# Patient Record
Sex: Male | Born: 1955 | Race: Black or African American | Hispanic: No | Marital: Married | Smoking: Never smoker
Health system: Southern US, Community
[De-identification: ages and names within clinical notes are randomized; demographics above are authoritative.]

## PROBLEM LIST (undated history)

## (undated) DIAGNOSIS — B019 Varicella without complication: Secondary | ICD-10-CM

## (undated) DIAGNOSIS — I10 Essential (primary) hypertension: Secondary | ICD-10-CM

## (undated) DIAGNOSIS — IMO0001 Reserved for inherently not codable concepts without codable children: Secondary | ICD-10-CM

## (undated) DIAGNOSIS — I4729 Other ventricular tachycardia: Secondary | ICD-10-CM

## (undated) DIAGNOSIS — I5020 Unspecified systolic (congestive) heart failure: Secondary | ICD-10-CM

## (undated) DIAGNOSIS — K635 Polyp of colon: Secondary | ICD-10-CM

## (undated) DIAGNOSIS — K219 Gastro-esophageal reflux disease without esophagitis: Secondary | ICD-10-CM

## (undated) DIAGNOSIS — R700 Elevated erythrocyte sedimentation rate: Secondary | ICD-10-CM

## (undated) DIAGNOSIS — I340 Nonrheumatic mitral (valve) insufficiency: Secondary | ICD-10-CM

## (undated) DIAGNOSIS — Z0000 Encounter for general adult medical examination without abnormal findings: Secondary | ICD-10-CM

## (undated) DIAGNOSIS — I4819 Other persistent atrial fibrillation: Secondary | ICD-10-CM

## (undated) DIAGNOSIS — R001 Bradycardia, unspecified: Secondary | ICD-10-CM

## (undated) DIAGNOSIS — M545 Low back pain: Secondary | ICD-10-CM

## (undated) DIAGNOSIS — R3911 Hesitancy of micturition: Secondary | ICD-10-CM

## (undated) DIAGNOSIS — T7840XA Allergy, unspecified, initial encounter: Secondary | ICD-10-CM

## (undated) DIAGNOSIS — I472 Ventricular tachycardia: Secondary | ICD-10-CM

## (undated) DIAGNOSIS — G629 Polyneuropathy, unspecified: Secondary | ICD-10-CM

## (undated) DIAGNOSIS — R748 Abnormal levels of other serum enzymes: Secondary | ICD-10-CM

## (undated) DIAGNOSIS — I428 Other cardiomyopathies: Secondary | ICD-10-CM

## (undated) DIAGNOSIS — I639 Cerebral infarction, unspecified: Secondary | ICD-10-CM

## (undated) HISTORY — DX: Polyneuropathy, unspecified: G62.9

## (undated) HISTORY — PX: CARDIAC CATHETERIZATION: SHX172

## (undated) HISTORY — DX: Gastro-esophageal reflux disease without esophagitis: K21.9

## (undated) HISTORY — DX: Encounter for general adult medical examination without abnormal findings: Z00.00

## (undated) HISTORY — DX: Cerebral infarction, unspecified: I63.9

## (undated) HISTORY — DX: Varicella without complication: B01.9

## (undated) HISTORY — DX: Ventricular tachycardia: I47.2

## (undated) HISTORY — DX: Hesitancy of micturition: R39.11

## (undated) HISTORY — DX: Abnormal levels of other serum enzymes: R74.8

## (undated) HISTORY — DX: Other persistent atrial fibrillation: I48.19

## (undated) HISTORY — DX: Other cardiomyopathies: I42.8

## (undated) HISTORY — DX: Reserved for inherently not codable concepts without codable children: IMO0001

## (undated) HISTORY — DX: Other ventricular tachycardia: I47.29

## (undated) HISTORY — DX: Unspecified systolic (congestive) heart failure: I50.20

## (undated) HISTORY — DX: Bradycardia, unspecified: R00.1

## (undated) HISTORY — DX: Elevated erythrocyte sedimentation rate: R70.0

## (undated) HISTORY — DX: Polyp of colon: K63.5

## (undated) HISTORY — DX: Allergy, unspecified, initial encounter: T78.40XA

## (undated) HISTORY — DX: Low back pain: M54.5

## (undated) HISTORY — DX: Nonrheumatic mitral (valve) insufficiency: I34.0

## (undated) HISTORY — DX: Essential (primary) hypertension: I10

---

## 2010-08-24 ENCOUNTER — Encounter (INDEPENDENT_AMBULATORY_CARE_PROVIDER_SITE_OTHER): Payer: Self-pay | Admitting: Pediatrics

## 2010-08-24 ENCOUNTER — Inpatient Hospital Stay (HOSPITAL_COMMUNITY)
Admission: EM | Admit: 2010-08-24 | Discharge: 2010-08-31 | Payer: Self-pay | Source: Home / Self Care | Attending: Neurology | Admitting: Neurology

## 2010-08-24 DIAGNOSIS — I4819 Other persistent atrial fibrillation: Secondary | ICD-10-CM

## 2010-08-24 DIAGNOSIS — I639 Cerebral infarction, unspecified: Secondary | ICD-10-CM

## 2010-08-24 HISTORY — DX: Other persistent atrial fibrillation: I48.19

## 2010-08-24 HISTORY — DX: Cerebral infarction, unspecified: I63.9

## 2010-08-25 ENCOUNTER — Encounter: Payer: Self-pay | Admitting: Pulmonary Disease

## 2010-08-26 ENCOUNTER — Encounter (INDEPENDENT_AMBULATORY_CARE_PROVIDER_SITE_OTHER): Payer: Self-pay | Admitting: Pediatrics

## 2010-08-30 ENCOUNTER — Encounter: Payer: Self-pay | Admitting: Internal Medicine

## 2010-09-01 ENCOUNTER — Telehealth (INDEPENDENT_AMBULATORY_CARE_PROVIDER_SITE_OTHER): Payer: Self-pay | Admitting: *Deleted

## 2010-09-02 ENCOUNTER — Encounter: Payer: Self-pay | Admitting: Physician Assistant

## 2010-09-02 ENCOUNTER — Ambulatory Visit: Payer: Self-pay | Admitting: Internal Medicine

## 2010-09-02 ENCOUNTER — Telehealth: Payer: Self-pay | Admitting: Internal Medicine

## 2010-09-02 DIAGNOSIS — I472 Ventricular tachycardia: Secondary | ICD-10-CM | POA: Insufficient documentation

## 2010-09-02 DIAGNOSIS — I4891 Unspecified atrial fibrillation: Secondary | ICD-10-CM | POA: Insufficient documentation

## 2010-09-02 DIAGNOSIS — R740 Nonspecific elevation of levels of transaminase and lactic acid dehydrogenase [LDH]: Secondary | ICD-10-CM

## 2010-09-02 DIAGNOSIS — L259 Unspecified contact dermatitis, unspecified cause: Secondary | ICD-10-CM | POA: Insufficient documentation

## 2010-09-02 DIAGNOSIS — I429 Cardiomyopathy, unspecified: Secondary | ICD-10-CM | POA: Insufficient documentation

## 2010-09-02 LAB — CONVERTED CEMR LAB: POC INR: 2.9

## 2010-09-03 ENCOUNTER — Ambulatory Visit: Payer: Self-pay | Admitting: Internal Medicine

## 2010-09-03 LAB — CONVERTED CEMR LAB
ALT: 39 units/L (ref 0–53)
AST: 37 units/L (ref 0–37)
Albumin: 3.6 g/dL (ref 3.5–5.2)
Alkaline Phosphatase: 68 units/L (ref 39–117)
BUN: 14 mg/dL (ref 6–23)
Basophils Absolute: 0 10*3/uL (ref 0.0–0.1)
Basophils Relative: 0.6 % (ref 0.0–3.0)
Bilirubin, Direct: 0.3 mg/dL (ref 0.0–0.3)
CO2: 26 meq/L (ref 19–32)
Calcium: 9.8 mg/dL (ref 8.4–10.5)
Chloride: 107 meq/L (ref 96–112)
Creatinine, Ser: 1 mg/dL (ref 0.4–1.5)
Eosinophils Absolute: 0.1 10*3/uL (ref 0.0–0.7)
Eosinophils Relative: 2.1 % (ref 0.0–5.0)
GFR calc non Af Amer: 97.13 mL/min (ref >60.00)
Glucose, Bld: 90 mg/dL (ref 70–99)
HCT: 43.3 % (ref 39.0–52.0)
Hemoglobin: 15.2 g/dL (ref 13.0–17.0)
Lymphocytes Relative: 15.5 % (ref 12.0–46.0)
Lymphs Abs: 0.8 10*3/uL (ref 0.7–4.0)
MCHC: 35 g/dL (ref 30.0–36.0)
MCV: 96.2 fL (ref 78.0–100.0)
Monocytes Absolute: 0.6 10*3/uL (ref 0.1–1.0)
Monocytes Relative: 12 % (ref 3.0–12.0)
Neutro Abs: 3.7 10*3/uL (ref 1.4–7.7)
Neutrophils Relative %: 69.8 % (ref 43.0–77.0)
Platelets: 261 10*3/uL (ref 150.0–400.0)
Potassium: 4.6 meq/L (ref 3.5–5.1)
RBC: 4.5 M/uL (ref 4.22–5.81)
RDW: 13.5 % (ref 11.5–14.6)
Sodium: 145 meq/L (ref 135–145)
Total Bilirubin: 1.6 mg/dL — ABNORMAL HIGH (ref 0.3–1.2)
Total Protein: 6.8 g/dL (ref 6.0–8.3)
WBC: 5.4 10*3/uL (ref 4.5–10.5)

## 2010-09-06 ENCOUNTER — Ambulatory Visit: Payer: Self-pay | Admitting: Internal Medicine

## 2010-09-06 ENCOUNTER — Ambulatory Visit: Payer: Self-pay | Admitting: Physician Assistant

## 2010-09-06 LAB — CONVERTED CEMR LAB: INR: 3.2

## 2010-09-08 ENCOUNTER — Telehealth: Payer: Self-pay | Admitting: Internal Medicine

## 2010-09-14 ENCOUNTER — Telehealth: Payer: Self-pay | Admitting: Internal Medicine

## 2010-09-15 ENCOUNTER — Telehealth (INDEPENDENT_AMBULATORY_CARE_PROVIDER_SITE_OTHER): Payer: Self-pay | Admitting: Radiology

## 2010-09-16 ENCOUNTER — Ambulatory Visit: Payer: Self-pay

## 2010-09-16 ENCOUNTER — Ambulatory Visit: Payer: Self-pay | Admitting: Cardiovascular Disease

## 2010-09-16 ENCOUNTER — Encounter: Payer: Self-pay | Admitting: Cardiology

## 2010-09-16 ENCOUNTER — Encounter (HOSPITAL_COMMUNITY)
Admission: RE | Admit: 2010-09-16 | Discharge: 2010-10-19 | Payer: Self-pay | Source: Home / Self Care | Attending: Internal Medicine | Admitting: Internal Medicine

## 2010-09-16 ENCOUNTER — Encounter: Payer: Self-pay | Admitting: Cardiovascular Disease

## 2010-09-16 LAB — CONVERTED CEMR LAB: POC INR: 3.5

## 2010-09-17 ENCOUNTER — Ambulatory Visit: Payer: Self-pay | Admitting: Physician Assistant

## 2010-09-17 ENCOUNTER — Encounter (INDEPENDENT_AMBULATORY_CARE_PROVIDER_SITE_OTHER): Payer: Self-pay | Admitting: *Deleted

## 2010-09-17 ENCOUNTER — Encounter: Payer: Self-pay | Admitting: Cardiovascular Disease

## 2010-09-17 ENCOUNTER — Encounter: Payer: Self-pay | Admitting: Physician Assistant

## 2010-09-17 DIAGNOSIS — R9430 Abnormal result of cardiovascular function study, unspecified: Secondary | ICD-10-CM | POA: Insufficient documentation

## 2010-09-17 LAB — CONVERTED CEMR LAB: POC INR: 3.2

## 2010-09-22 ENCOUNTER — Telehealth: Payer: Self-pay | Admitting: Cardiovascular Disease

## 2010-09-23 ENCOUNTER — Ambulatory Visit (HOSPITAL_COMMUNITY)
Admission: RE | Admit: 2010-09-23 | Discharge: 2010-09-24 | Payer: Self-pay | Source: Home / Self Care | Attending: Cardiology | Admitting: Cardiology

## 2010-09-23 LAB — APTT: aPTT: 28 seconds (ref 24–37)

## 2010-09-23 LAB — PROTIME-INR
INR: 1.1 (ref 0.00–1.49)
Prothrombin Time: 14.4 seconds (ref 11.6–15.2)

## 2010-09-24 ENCOUNTER — Ambulatory Visit: Admit: 2010-09-24 | Payer: Self-pay

## 2010-09-24 ENCOUNTER — Encounter: Payer: Self-pay | Admitting: Physician Assistant

## 2010-09-24 LAB — CBC
HCT: 40 % (ref 39.0–52.0)
Hemoglobin: 14 g/dL (ref 13.0–17.0)
MCH: 32 pg (ref 26.0–34.0)
MCHC: 35 g/dL (ref 30.0–36.0)
MCV: 91.3 fL (ref 78.0–100.0)
Platelets: 164 10*3/uL (ref 150–400)
RBC: 4.38 MIL/uL (ref 4.22–5.81)
RDW: 12.8 % (ref 11.5–15.5)
WBC: 4.8 10*3/uL (ref 4.0–10.5)

## 2010-09-24 LAB — BASIC METABOLIC PANEL
BUN: 7 mg/dL (ref 6–23)
CO2: 26 mEq/L (ref 19–32)
Calcium: 9.1 mg/dL (ref 8.4–10.5)
Chloride: 107 mEq/L (ref 96–112)
Creatinine, Ser: 1.09 mg/dL (ref 0.4–1.5)
GFR calc non Af Amer: >60 mL/min (ref >60)
Glucose, Bld: 88 mg/dL (ref 70–99)
Sodium: 140 mEq/L (ref 135–145)

## 2010-09-27 ENCOUNTER — Telehealth: Payer: Self-pay | Admitting: Internal Medicine

## 2010-09-27 LAB — CONVERTED CEMR LAB: Digitoxin Lvl: 0.9 ng/mL (ref 0.8–2.0)

## 2010-09-28 ENCOUNTER — Ambulatory Visit: Admission: RE | Admit: 2010-09-28 | Discharge: 2010-09-28 | Payer: Self-pay | Source: Home / Self Care

## 2010-09-28 LAB — CONVERTED CEMR LAB
INR: 1.6
POC INR: 1.6

## 2010-10-04 ENCOUNTER — Encounter
Admission: RE | Admit: 2010-10-04 | Discharge: 2010-10-19 | Payer: Self-pay | Source: Home / Self Care | Attending: Neurology | Admitting: Neurology

## 2010-10-06 ENCOUNTER — Other Ambulatory Visit: Payer: Self-pay | Admitting: Internal Medicine

## 2010-10-06 ENCOUNTER — Ambulatory Visit: Admission: RE | Admit: 2010-10-06 | Discharge: 2010-10-06 | Payer: Self-pay | Source: Home / Self Care

## 2010-10-06 ENCOUNTER — Ambulatory Visit (HOSPITAL_COMMUNITY)
Admission: RE | Admit: 2010-10-06 | Discharge: 2010-10-06 | Payer: Self-pay | Source: Home / Self Care | Attending: Internal Medicine | Admitting: Internal Medicine

## 2010-10-06 ENCOUNTER — Ambulatory Visit
Admission: RE | Admit: 2010-10-06 | Discharge: 2010-10-06 | Payer: Self-pay | Source: Home / Self Care | Attending: Internal Medicine | Admitting: Internal Medicine

## 2010-10-06 ENCOUNTER — Encounter: Payer: Self-pay | Admitting: Internal Medicine

## 2010-10-06 LAB — CONVERTED CEMR LAB: POC INR: 2.1

## 2010-10-11 ENCOUNTER — Telehealth: Payer: Self-pay | Admitting: Internal Medicine

## 2010-10-14 ENCOUNTER — Ambulatory Visit: Admission: RE | Admit: 2010-10-14 | Discharge: 2010-10-14 | Payer: Self-pay | Source: Home / Self Care

## 2010-10-14 LAB — CONVERTED CEMR LAB: POC INR: 2

## 2010-10-14 NOTE — Discharge Summary (Addendum)
Tyler Sims, Tyler Sims              ACCOUNT NO.:  [id number]  MEDICAL RECORD NO.:  [id number]          PATIENT TYPE:  OIB  LOCATION:  6524                         FACILITY:  [facility]  PHYSICIAN:  Arturo Morton. Riley Kill, MD, FACCDATE OF BIRTH:  12/12/1955  DATE OF ADMISSION:  09/23/2010 DATE OF DISCHARGE:  09/24/2010                              DISCHARGE SUMMARY   PRIMARY CARDIOLOGIST:  Duke Salvia, MD, [facility]  NEUROLOGIST:  Pramod P. Pearlean Brownie, MD  DISCHARGE DIAGNOSIS:  Nonischemic cardiomyopathy.  SECONDARY DIAGNOSES: 1. Chronic systolic congestive heart failure.  Ejection fraction 30-     35% by left ventriculography this admission. 2. Normal coronary arteries by catheterization this admission. 3. Atrial fibrillation, on Coumadin therapy. 4. 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     5.  History of nocturnal bradycardia. 5. Nonsustained ventricular tachycardia with LifeVest in place. 6. Moderate-to-severe mitral regurgitation.  ALLERGIES:  LISINOPRIL causing a rash.  PROCEDURES:  Left heart cardiac catheterization revealing normal coronary arteries with an ejection fraction of 30-35%.  HISTORY OF PRESENT ILLNESS:  A 55 year old African male who was admitted to [facility] on December 6 secondary to acute left MCA distribution CVA in the setting of AFib with RVR.  The patient was treated with TPA with minimal residual neurologic deficits.  His stroke was felt to be embolic in nature.  The patient was placed on Coumadin.  During that admission, he was also found to have an EF of 15-20% by 2-D echocardiogram.  This was initially presumed to be nonischemic.  The patient also had nonsustained VT during that admission and was discharged with a LifeVest in place.  Following discharge, the patient underwent Lexiscan Myoview showing an EF of 40% but evidence of inferobasal infarct  and anteroapical ischemia.  Subsequently followed up in our office on December 13 and arrangements were made for cardiac catheterization.  As the patient had prior stroke in AFib, he was treated with Lovenox to bridge him to catheterization as Coumadin was on hold.  HOSPITAL COURSE:  The patient presented to the [facility] on January 5.  Diagnostic catheterization was undertaken revealing normal coronary arteries and an estimated EF of 30-35%.  Continued medical therapy was recommended.  Following catheterization, he was placed back on half dose (0.5 mg/kg) Lovenox and Coumadin has been resumed. Postprocedure, we have considered initiation of ARB therapy as ACE inhibitor was believed to have caused a rash in the past.  However, his blood pressures have been soft in the low 100s and we will defer initiation of ARB therapy to the outpatient setting at this time.  This morning the patient has been ambulating without recurrent symptoms or limitations.  He will be discharged home today in good condition.  DISCHARGE LABORATORY FINDINGS:  Hemoglobin 14.0, hematocrit 40.0, WBC 4.8, platelets 164, INR 1.1.  Sodium 140, potassium 4.2, chloride 107, CO2 26, BUN 7, creatinine 1.09, glucose 88, calcium 9.1.  DISPOSITION:  The patient will be discharged home today in good condition.  FOLLOWUP APPOINTMENTS:  The patient has  to follow up [facility] on Tuesday, January 10 at 8 a.m.  He will follow up with Dr. Sherryl Manges on January 18 at 10:30 a.m.  Repeat echocardiogram was also scheduled at that day.  DISCHARGE MEDICATIONS: 1. Lovenox 40 mg b.i.d. through January 7. 2. Coumadin 5 mg 1 tablet daily Monday through Saturday, 1-1/2 tablets     on Sundays. 3. Digoxin 0.25 mg half tablet daily. 4. Famotidine 20 mg b.i.d. 5. Metoprolol XL 50 mg daily.  OUTSTANDING LABORATORY STUDIES:  Followup INR on Tuesday, January 10.  DURATION DISCHARGE ENCOUNTER:  60 minutes  including physician time.     Nicolasa Ducking, ANP   ______________________________ Arturo Morton. Riley Kill, MD, [facility]    CB/MEDQ  D:  09/24/2010  T:  09/24/2010  Job:  [id number]  cc:   Pramod P. Pearlean Brownie, MD  Electronically Signed by Nicolasa Ducking ANP on 10/11/2010 03:44:59 PM Electronically Signed by Shawnie Pons MD [facility] on 10/14/2010 04:43:37 AM

## 2010-10-14 NOTE — Procedures (Addendum)
Tyler Sims, Tyler Sims              ACCOUNT NO.:  [id number]  MEDICAL RECORD NO.:  [id number]          PATIENT TYPE:  OIB  LOCATION:  6524                         FACILITY:  [facility]  PHYSICIAN:  Arturo Morton. Riley Kill, MD, FACCDATE OF BIRTH:  06-28-1956  DATE OF PROCEDURE:  09/23/2010 DATE OF DISCHARGE:                           CARDIAC CATHETERIZATION   Tyler Sims is a delightful gentleman who presented with a major stroke approximately 1 month ago.  He was treated emergently with intra- arterial TPA and thrombectomy.  He was noted to have reduced ejection fraction of 15-20%.  He was presumed to have a nonischemic cardiomyopathy.  As the CVA was thought to be embolic, he was placed on Coumadin.  He was in atrial fibrillation.  He now has a controlled ventricular response, but an abnormal Myoview.  Dr. Graciela Husbands felt that discontinuation of anticoagulation was appropriate to proceed with cardiac catheterization.  I met the patient, and his wife.  The patient has a LifeVest on at the present time.  Risks and benefits were discussed including the risk of discontinuation of warfarin, which has already been done.  His labs are in order.  Current study was done to assess coronary anatomy.  Repeat INR today was 1.1.  PROCEDURE: 1. Left heart catheterization. 2. Selective coronary arteriography. 3. Selective left ventriculography.  DESCRIPTION OF PROCEDURE:  The procedure was performed from a right radial artery using 5-French catheters.  Intra-arterial verapamil 3 mg was utilized for vasodilatation.  Intravenous heparin at 4000 units was also utilized.  Following this, views of the left and right coronary arteries were obtained.  Central aortic and left ventricular pressures were measured.  Ventriculography was performed in the RAO projection. There were no major complications.  Appropriate sedation was given with midazolam and fentanyl.  After the completion of the procedure, the right radial  sheath was removed and a TR band was placed with good hemostasis.  HEMODYNAMIC DATA: 1. The central aortic pressure is 113/65, mean 86. 2. LV pressure 116/70. 3. No gradient pullback across the aortic valve.  ANGIOGRAPHIC DATA.: 1. Ventriculography done in the RAO projection reveals global     hypokinesis today.  The estimated ejection fraction will be 30-35%,     but it should be noted that the patient is in atrial fibrillation,     so accurate calculation cannot be performed. 2. The right coronary artery is a fairly large-caliber vessel with an     acute marginal and a distal posterior descending branch.  The     vessel caliber is quite large, and they are smooth without     significant focal obstruction. 3. The left main is free of critical disease. 4. The LAD is somewhat tortuous, large in caliber, and courses to the     apex where it wraps the apical tip and provides some of the     inferior distal wall.  There is a smaller diagonal, which comes off     proximally and is free of critical disease. 5. There is an AV circumflex and ramus intermedius vessel.  The     intermediate is fairly large in caliber,  bifurcates without     critical disease.  CONCLUSION: 1. Recent onset of atrial fibrillation. 2. Nonischemic cardiomyopathy with reduced overall left ventricular     ejection fraction. 3. No evidence of high-grade coronary obstruction. 4. Recent stroke.  DISPOSITION:  The patient will be brought in, restarted on Lovenox, and restarted on warfarin.     Arturo Morton. Riley Kill, MD, [facility]     TDS/MEDQ  D:  09/23/2010  T:  09/24/2010  Job:  [id number]  cc:   Duke Salvia, MD, [facility] Tereso Newcomer, PA-C CV Laboratory  Electronically Signed by Shawnie Pons MD [facility] on 10/14/2010 04:43:34 AM

## 2010-10-21 ENCOUNTER — Ambulatory Visit: Admit: 2010-10-21 | Payer: Self-pay

## 2010-10-21 ENCOUNTER — Other Ambulatory Visit: Payer: Self-pay

## 2010-10-21 ENCOUNTER — Encounter: Payer: Self-pay | Admitting: Internal Medicine

## 2010-10-21 ENCOUNTER — Encounter (INDEPENDENT_AMBULATORY_CARE_PROVIDER_SITE_OTHER): Payer: Managed Care, Other (non HMO)

## 2010-10-21 ENCOUNTER — Encounter: Payer: Self-pay | Admitting: Cardiology

## 2010-10-21 ENCOUNTER — Other Ambulatory Visit (INDEPENDENT_AMBULATORY_CARE_PROVIDER_SITE_OTHER): Payer: Managed Care, Other (non HMO)

## 2010-10-21 ENCOUNTER — Encounter: Payer: Self-pay | Admitting: Occupational Therapy

## 2010-10-21 ENCOUNTER — Ambulatory Visit: Admit: 2010-10-21 | Payer: Self-pay | Admitting: Internal Medicine

## 2010-10-21 DIAGNOSIS — I472 Ventricular tachycardia: Secondary | ICD-10-CM

## 2010-10-21 DIAGNOSIS — I4891 Unspecified atrial fibrillation: Secondary | ICD-10-CM

## 2010-10-21 DIAGNOSIS — R0989 Other specified symptoms and signs involving the circulatory and respiratory systems: Secondary | ICD-10-CM

## 2010-10-21 DIAGNOSIS — I429 Cardiomyopathy, unspecified: Secondary | ICD-10-CM

## 2010-10-21 DIAGNOSIS — Z7901 Long term (current) use of anticoagulants: Secondary | ICD-10-CM

## 2010-10-21 LAB — CONVERTED CEMR LAB
BUN: 13 mg/dL (ref 6–23)
Basophils Absolute: 0.1 10*3/uL (ref 0.0–0.1)
Basophils Relative: 1.3 % (ref 0.0–3.0)
CO2: 30 meq/L (ref 19–32)
Calcium: 9.8 mg/dL (ref 8.4–10.5)
Chloride: 105 meq/L (ref 96–112)
Creatinine, Ser: 1.1 mg/dL (ref 0.4–1.5)
Eosinophils Absolute: 0 10*3/uL (ref 0.0–0.7)
Eosinophils Relative: 0.9 % (ref 0.0–5.0)
GFR calc non Af Amer: 91.95 mL/min (ref >60.00)
Glucose, Bld: 97 mg/dL (ref 70–99)
HCT: 42 % (ref 39.0–52.0)
Hemoglobin: 14.6 g/dL (ref 13.0–17.0)
INR: 3.8 — ABNORMAL HIGH (ref 0.8–1.0)
Lymphocytes Relative: 23.9 % (ref 12.0–46.0)
Lymphs Abs: 1.2 10*3/uL (ref 0.7–4.0)
MCHC: 34.9 g/dL (ref 30.0–36.0)
MCV: 95.3 fL (ref 78.0–100.0)
Monocytes Absolute: 0.8 10*3/uL (ref 0.1–1.0)
Monocytes Relative: 16.1 % — ABNORMAL HIGH (ref 3.0–12.0)
Neutro Abs: 2.9 10*3/uL (ref 1.4–7.7)
Neutrophils Relative %: 57.8 % (ref 43.0–77.0)
POC INR: 2.4
Platelets: 238 10*3/uL (ref 150.0–400.0)
Potassium: 4.7 meq/L (ref 3.5–5.1)
Prothrombin Time: 40.4 s — ABNORMAL HIGH (ref 9.7–11.8)
RBC: 4.4 M/uL (ref 4.22–5.81)
RDW: 13.1 % (ref 11.5–14.6)
Sodium: 140 meq/L (ref 135–145)
WBC: 5.1 10*3/uL (ref 4.5–10.5)
aPTT: 35.6 s — ABNORMAL HIGH (ref 21.7–28.8)

## 2010-10-21 NOTE — Progress Notes (Signed)
Summary: disability paperwork  Phone Note Call from Patient Call back at Home Phone [phone]   Reason for Call: Talk to Nurse, Talk to Doctor Summary of Call: pt calling regarding disability paperwork and if it is complete Initial call taken by: Omer Jack,  September 14, 2010 12:22 PM  Follow-up for Phone Call        adv pt that what p/w we had was fax'd to Dr. Pearlean Brownie. Gave him the number to call to f/u with them.  Follow-up by: Claris Gladden RN,  September 14, 2010 5:00 PM

## 2010-10-21 NOTE — Letter (Signed)
Summary: Life Vest  Life Vest   Imported By: Marylou Mccoy 09/17/2010 19:23:14  _____________________________________________________________________  External Attachment:    Type:   Image     Comment:   External Document

## 2010-10-21 NOTE — Medication Information (Signed)
Summary: rov/sp  Anticoagulant Therapy  Managed by: Weston Brass, PharmD Referring MD: Berton Mount, MD Supervising MD: Patty Sermons, MD  Indication 1: Atrial Fibrillation Indication 2: Cerebrovascular Accident Lab Used: [facility] [place] Site: [address] INR POC 2.0 INR RANGE 2.0-3.0  Dietary changes: no    Health status changes: yes       Details: Started Losartan last week, some dizziness since starting that medication.   Bleeding/hemorrhagic complications: no    Recent/future hospitalizations: yes       Details: Pending cardioversion   Any changes in medication regimen? yes       Details: Losartan 25 mg daily   Recent/future dental: no  Any missed doses?: no       Is patient compliant with meds? yes       Allergies: 1)  ! Lisinopril 2)  ! Simvastatin  Anticoagulation Management History:      The patient is taking warfarin and comes in today for a routine follow up visit.  Negative risk factors for bleeding include an age less than 40 years old.  The bleeding index is 'low risk'.  Negative CHADS2 values include Age > 55 years old.  His last INR was 1.6.  Anticoagulation responsible provider: Patty Sermons, MD .  INR POC: 2.0.  Cuvette Lot#: [id number].  Exp: 09/2011.    Anticoagulation Management Assessment/Plan:      The patient's current anticoagulation dose is Warfarin sodium 5 mg tabs: Use as directed by Anticoagulation Clinic.  The target INR is 2.0-3.0.  The next INR is due 10/21/2010.  Anticoagulation instructions were given to patient.  Results were reviewed/authorized by Weston Brass, PharmD.         Prior Anticoagulation Instructions: INR 2.1  Coumadin 5 mg tablets - Continue 1 tablet every day except 1.5 tablets on Sundays   Current Anticoagulation Instructions: INR 2.0  Coumadin 5mg  tablets - Continue 1 tablet every day except 1.5 tablets on Sundays

## 2010-10-21 NOTE — Assessment & Plan Note (Signed)
Summary: Cardiology Nuclear Testing  Nuclear Med Background Indications for Stress Test: Evaluation for Ischemia, Post Hospital  Indications Comments: 08/24/10 [facility] with CVA d/t embolus treated with TPA  History: Echo  History Comments: 08/24/10 Echo:EF=15-20%, moderate to severe MR ; h/o afib, NSVT, NICM; patient wears a life vest  Symptoms: Chest Pressure, Chest Pressure with Exertion, Chest Tightness, Chest Tightness with Exertion, Fatigue  Symptoms Comments: Last episode of ZO:XWRU night.   Nuclear Pre-Procedure Cardiac Risk Factors: CVA, Lipids Caffeine/Decaff Intake: none NPO After: 7:00 PM Lungs: Clear.  O2 Sat 97% on RA. IV 0.9% NS with Angio Cath: 20g     IV Site: R Antecubital IV Started by: Stanton Kidney, EMT-P Chest Size (in) 46     Height (in): 74 Weight (lb): 196 BMI: 25.26 Tech Comments: Metoprolol held > 24 hours, per Pt.  Nuclear Med Study 1 or 2 day study:  1 day     Stress Test Type:  Lexiscan Reading MD:  Charlton Haws, MD     Referring MD:  Berton Mount, MD Resting Radionuclide:  Technetium 34m Tetrofosmin     Resting Radionuclide Dose:  10.8 mCi  Stress Radionuclide:  Technetium 18m Tetrofosmin     Stress Radionuclide Dose:  33 mCi   Stress Protocol      Max HR:  122 bpm     Predicted Max HR:  167 bpm  Max Systolic BP: 120 mm Hg     Percent Max HR:  73.05 %Rate Pressure Product:  04540  Lexiscan: 0.4 mg   Stress Test Technologist:  Rea [employer], CMA-N     Nuclear Technologist:  Domenic Polite, CNMT  Rest Procedure  Myocardial perfusion imaging was performed at rest 45 minutes following the intravenous administration of Technetium 69m Tetrofosmin.  Stress Procedure  The patient received IV Lexiscan 0.4 mg over 15-seconds.  Technetium 31m Tetrofosmin injected at 30-seconds.  There were more diffuse T-wave changes and a hypotensive response with infusion.  Quantitative spect images were obtained after a 45 minute delay.  QPS Raw Data Images:  Normal;  no motion artifact; normal heart/lung ratio. Stress Images:  Decrease anterior counts Rest Images:  Inferobasal deficit Subtraction (SDS):  SDS 4 apical ischemia Transient Ischemic Dilatation:  1.05  (Normal <1.22)  Lung/Heart Ratio:  0.31  (Normal <0.45)  Quantitative Gated Spect Images QGS EDV:  168 ml QGS ESV:  101 ml QGS EF:  40 % QGS cine images:  Septal and apical hypokinesis  Findings Moderate risk nuclear study Evidence for anterior (septal apical) ischemia  Evidence for inferior infarct  Evidence for LV Dysfunction LV Dysfunction    Overall Impression  Exercise Capacity: Lexiscan with no exercise. BP Response: Normal blood pressure response. Clinical Symptoms: Dyspnea ECG Impression: AFib baseline lateral T wave changes Overall Impression: Inferobasal infarct with anteroapical ischemia.  Findings consistant with multi-vessel CAD with decreased LV function

## 2010-10-21 NOTE — Progress Notes (Signed)
Summary: pt has questions  Phone Note Call from Patient   Caller: Spouse [phone] judith Reason for Call: Talk to Nurse Summary of Call: pt's wife calling re speech/physical rehab Initial call taken by: Glynda Jaeger,  September 08, 2010 11:07 AM  Follow-up for Phone Call        rna at pt house. Claris Gladden, RN, BSN 09/08/10 1324 [id number] 12/21 pt just stepped out to lunch. will callback later. Claris Gladden, RN, BSN spoke w/Mr. Newsom and adv him his wife called about speech and  physical rehab. he adv she should be home after 530. will callback after that time.  Follow-up by: Claris Gladden RN,  September 08, 2010 5:02 PM  Additional Follow-up for Phone Call Additional follow up Details #1::        spoke w/pt spouse and adv that neuro should have set up rehab. she will contact Dr. Pearlean Brownie office to discuss. Adv her if we need to provide cardiac info, that would not be a problem.  Additional Follow-up by: Claris Gladden RN,  September 08, 2010 5:43 PM

## 2010-10-21 NOTE — Progress Notes (Addendum)
Summary: Allergic Reaction  Phone Note Call from Patient Call back at Home Phone [phone]   Caller: Wife Reason for Call: Talk to Doctor Summary of Call: Returned call from pt's wife concerning urticaria and pruritis.  Pt was discharged from the hospital yesterday after treatment of CVA, Afib, and presumed NICM.  He began to experience pruritis this afternoon.  The spots he scratched have turned into whelps.  Pt's discharge medications are all new to the patient but the rash did not appear while in the hospital.  With further questioning the wife states that they used wipes from the hospital this am to get rid of adhesive tape.  The patients whleps are in the same areas as the areas used with the adhesive removal wipes.  These symptoms have been going on for several hours now and the patient is not having edema of the face or tongue or difficulty breathing.  They have tried ice and calamine lotion without relief.  I have instructed the pateint to take benadryl and monitor symptoms closely.  If the patient begins to experience any difficulty with breathing he is to call 911  if symptoms do not improve then he is to call the office for further discussion of discontinueing medications.  Pt and his wife voice understanding.  I feel as though the patients allergic reaction is secondary to the adhesive removal wipes and not discharge medications.   Initial call taken by: Robbi Garter NP-PA,  September 01, 2010 10:05 PM

## 2010-10-21 NOTE — Medication Information (Signed)
Summary: rov/sp  Anticoagulant Therapy  Managed by: Louann Sjogren, PharmD Referring MD: Berton Mount, MD Supervising MD: Lucia Harm md Indication 1: Atrial Fibrillation Indication 2: Cerebrovascular Accident Lab Used: [facility] [place] Site: [address] INR POC 1.6 INR RANGE 2.0-3.0  Dietary changes: no    Health status changes: no    Bleeding/hemorrhagic complications: no    Recent/future hospitalizations: no    Any changes in medication regimen? no    Recent/future dental: no  Any missed doses?: no       Is patient compliant with meds? yes       Current Medications (verified): 1)  Warfarin Sodium 5 Mg Tabs (Warfarin Sodium) .... Use As Directed By Anticoagulation Clinic 2)  Metoprolol Succinate 50 Mg Xr24h-Tab (Metoprolol Succinate) .... Take One Tablet By Mouth Daily 3)  Digoxin 0.25 Mg Tabs (Digoxin) .... Take 1/2  Tablet By Mouth Daily 4)  Pepcid 20 Mg Tabs (Famotidine) .... Take 1 Tablet By Mouth Two Times A Day 5)  Enoxaparin Sodium 80 Mg/0.34ml Soln (Enoxaparin Sodium) .... Inject 1 Syringe Subcutaneously Two Times A Day As Directed  Allergies (verified): No Known Drug Allergies  Anticoagulation Management History:      The patient is taking warfarin and comes in today for a routine follow up visit.  Negative risk factors for bleeding include an age less than 97 years old.  The bleeding index is 'low risk'.  Negative CHADS2 values include Age > 39 years old.  His last INR was 3.8 ratio and today's INR is 1.6.  Anticoagulation responsible provider: Tyna Huertas md.  INR POC: 1.6.  Cuvette Lot#: [id number].  Exp: 10/2011.    Anticoagulation Management Assessment/Plan:      The patient's current anticoagulation dose is Warfarin sodium 5 mg tabs: Use as directed by Anticoagulation Clinic.  The target INR is 2.0-3.0.  The next INR is due 10/06/2010.  Anticoagulation instructions were given to patient.  Results were reviewed/authorized by Louann Sjogren,  PharmD.  He was notified by Louann Sjogren PharmD.         Prior Anticoagulation Instructions: INR 3.2  12/30- No Coumadin or Lovenox 12/31- Start Lovenox 80mg  injection in PM 1/1- Lovenox 80mg  injection in AM and PM 1/2- Lovenox 80mg  injection in AM and PM 1/3- Lovenox 80mg  injection in AM and PM 1/4- Lovenox 80mg  injection in AM ONLY 1/5- Procedure.  When okay with MD, restart Coumadin AND Lovenox.  Continue Lovenox 80mg  twice a day until next appt.  Take 1 1/2 tablets of Coumadin x 2 days then resume same dose of 1 tablet every day except 1 1/2 tablets on Sunday.        Current Anticoagulation Instructions: INR 1.6 (goal 2-3)  Take 1 1/2 tablets tonight (09/28/2010) and tomorrow (09/29/2010).  Then return to normal dosing schedule below, beginning this Thursday.  Next appointment: Wednesday, Jan. 18th at 9:00AM.

## 2010-10-21 NOTE — Assessment & Plan Note (Signed)
Summary: f1w   Referring Provider:  Sherryl Manges   History of Present Illness: Primary Electrophysiologist:  Dr. Sherryl Manges  Tyler Sims is a 55 yo male who presented to [facility] on 12/6 with an acute left MCA distribution CVA treated emergently with intra-arterial tPA and thrmobectomy.  He was noted to be in AFib with RVR and an echo demonstrated reduced LVF with an EF 15-20%.  He was presumed to have a nonischemic cardiomyopathy.  The CVA was thought to be embolic and he was placed on coumadin.  He was noted to have NSVT and was placed on a lifevest.  I saw him last week with a rash that appeared to be a fixed drug eruption.  I put him on prednisone, H2RA and an antihistamine (hydroxyzine).  I stopped his ACE and Simvastatin and brought him back today for close follow up.  He is doing much better.  His rash is 95% gone.  He finishes the prednisone tomorrow.  He is no longer taking simva or lisinopril.  He denies chest pain, dyspnea, syncope, orthopnea or PND.   Current Medications (verified): 1)  Warfarin Sodium 5 Mg Tabs (Warfarin Sodium) .... Use As Directed By Anticoagulation Clinic 2)  Metoprolol Succinate 50 Mg Xr24h-Tab (Metoprolol Succinate) .... Take One Tablet By Mouth Daily 3)  Digoxin 0.25 Mg Tabs (Digoxin) .... Take One Tablet By Mouth Daily 4)  Hydroxyzine Hcl 25 Mg Tabs (Hydroxyzine Hcl) .... Take One By Mouth Every 6-8 Hours As Needed 5)  Pepcid 20 Mg Tabs (Famotidine) .... Take 1 Tablet By Mouth Two Times A Day 6)  Prednisone 10 Mg Tabs (Prednisone) .... Take 3 Tabs Today, 3 Tabs On Fri., 3 Tabs On Sat., 2 Tabs On Sun., 2 Tabs On Mon., 1 Tab On Tues., 1 Tab On Wed. and Stop  Allergies (verified): No Known Drug Allergies  Past History:  Past Medical History: Last updated: 09/02/2010 s/p acute left MCA stroke 12/6 treated with TPA   a.  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  Systolic congestive heart    h/o Nocturnal bradycardia.  Nonsustained ventricular tachycardia . . .  LifeVest vest  Moderate-to-severe mitral regurgitation.   Vital Signs:  Patient profile:   55 year old male Height:      74 inches Weight:      205 pounds BMI:     26.42 Pulse rate:   64 / minute Resp:     16 per minute BP sitting:   111 / 73  (left arm)  Vitals Entered By: Marrion Coy, CNA (September 06, 2010 3:11 PM)  Physical Exam  General:  Well nourished, well developed, in no acute distress HEENT: normal Neck: no JVD Cardiac:  normal S1, S2; irreg irreg rhythm; no murmur Lungs:  clear to auscultation bilaterally, no wheezing, rhonchi or rales Abd: soft, nontender, no hepatomegaly Ext: no edema Lymph: no cervical adenopathy Endo: no thyromegaly Skin: no rash Neuro:  CNs 2-12 intact, no focal abnormalities noted    Impression & Recommendations:  Problem # 1:  SKIN RASH, ALLERGIC (ICD-692.9) Assessment Improved Resolved. Either from ACE or simvastatin. Afterload reducer most important. I see him next week and will liekly try to start ARB.  Problem # 2:  ATRIAL FIBRILLATION (ICD-427.31)  Still in AFib by my exam. Rate well controlled. INR did not go too high with prednisone  Problem # 3:  CARDIOMYOPATHY, SECONDARY (ICD-425.9)  As above, will try to start ARB next week.  F/u echo due in Jan.  Problem # 4:  VENTRICULAR TACHYCARDIA (ICD-427.1)  He is wearing his LifeVest.  Patient Instructions: 1)  Please keep you appointments as already scheduled for : 09/16/10 @ 8:30am for your stress test, & 09/17/10 @ 12:00pm with Lilian Coma.  Appended Document: f1w D/w pharmacy. Should be ok to try an ARB. Will try to initiate ARB at next visit.

## 2010-10-21 NOTE — Medication Information (Signed)
Summary: rov/mwb  Anticoagulant Therapy  Managed by: Weston Brass, PharmD Referring MD: Linus Orn MD: Eden Emms MD, Theron Arista Indication 1: Atrial Fibrillation Indication 2: Cerebrovascular Accident Lab Used: [facility] [place] Site: [address] INR POC 3.5 INR RANGE 2.0-3.0  Dietary changes: no    Health status changes: no    Bleeding/hemorrhagic complications: no    Recent/future hospitalizations: no    Any changes in medication regimen? no    Recent/future dental: no  Any missed doses?: no       Is patient compliant with meds? yes       Allergies: No Known Drug Allergies  Anticoagulation Management History:      The patient is taking warfarin and comes in today for a routine follow up visit.  Negative risk factors for bleeding include an age less than 85 years old.  The bleeding index is 'low risk'.  Negative CHADS2 values include Age > 62 years old.  His last INR was 3.2.  Anticoagulation responsible provider: Eden Emms MD, Theron Arista.  INR POC: 3.5.  Cuvette Lot#: [id number].  Exp: 10/2011.    Anticoagulation Management Assessment/Plan:      The patient's current anticoagulation dose is Warfarin sodium 5 mg tabs: Use as directed by Anticoagulation Clinic.  The target INR is 2.0-3.0.  The next INR is due 09/23/2010.  Anticoagulation instructions were given to patient.  Results were reviewed/authorized by Weston Brass, PharmD.  He was notified by Weston Brass PharmD.         Prior Anticoagulation Instructions: INR 3.2 Take 1 tablet (5mg ) today instead of 1.5 tablet Take 1.5 tablet (7.5mg ) on Sun, Wed, and Fri and take 1 tablet (5mg ) the rest of the days. Recheck INR on Dec. 29    Current Anticoagulation Instructions: INR 3.5  Skip today's dose of Coumadin then decrease dose to 1 tablet every day except 1 1/2 tablets on Sunday.  Recheck INR in 1 week.

## 2010-10-21 NOTE — Medication Information (Signed)
Summary: Coumadin Clinic  Anticoagulant Therapy  Managed by: Weston Brass, PharmD Referring MD: Berton Mount, MD Supervising MD: Clifton James MD, Cristal Deer Indication 1: Atrial Fibrillation Indication 2: Cerebrovascular Accident Lab Used: [facility] [place] Site: [address] INR POC 3.2 INR RANGE 2.0-3.0  Dietary changes: no    Health status changes: no    Bleeding/hemorrhagic complications: no    Recent/future hospitalizations: yes       Details: pt pending cardiac cath on 1/5 after abnormal stress test.  Needs lovenox bridging  Any changes in medication regimen? no    Recent/future dental: no  Any missed doses?: no       Is patient compliant with meds? yes        Current Medications (verified): 1)  Warfarin Sodium 5 Mg Tabs (Warfarin Sodium) .... Use As Directed By Anticoagulation Clinic 2)  Metoprolol Succinate 50 Mg Xr24h-Tab (Metoprolol Succinate) .... Take One Tablet By Mouth Daily 3)  Digoxin 0.25 Mg Tabs (Digoxin) .... Take 1/2  Tablet By Mouth Daily 4)  Pepcid 20 Mg Tabs (Famotidine) .... Take 1 Tablet By Mouth Two Times A Day 5)  Enoxaparin Sodium 80 Mg/0.37ml Soln (Enoxaparin Sodium) .... Inject 1 Syringe Subcutaneously Two Times A Day As Directed  Allergies: No Known Drug Allergies  Anticoagulation Management History:      The patient is taking warfarin and comes in today for a routine follow up visit.  Negative risk factors for bleeding include an age less than 50 years old.  The bleeding index is 'low risk'.  Negative CHADS2 values include Age > 61 years old.  His last INR was 3.2.  Anticoagulation responsible Zori Benbrook: Clifton James MD, Cristal Deer.  INR POC: 3.2.  Exp: 10/2011.    Anticoagulation Management Assessment/Plan:      The patient's current anticoagulation dose is Warfarin sodium 5 mg tabs: Use as directed by Anticoagulation Clinic.  The target INR is 2.0-3.0.  The next INR is due 09/28/2010.  Anticoagulation instructions were given to  patient.  Results were reviewed/authorized by Weston Brass, PharmD.  He was notified by Weston Brass PharmD.         Prior Anticoagulation Instructions: INR 3.5  Skip today's dose of Coumadin then decrease dose to 1 tablet every day except 1 1/2 tablets on Sunday.  Recheck INR in 1 week.   Current Anticoagulation Instructions: INR 3.2  12/30- No Coumadin or Lovenox 12/31- Start Lovenox 80mg injection in PM 1/1- Lovenox 80mg injection in AM and PM 1/2- Lovenox 80mg injection in AM and PM 1/3- Lovenox 80mg injection in AM and PM 1/4- Lovenox 80mg injection in AM ONLY 1/5- Procedure.  When okay with MD, restart Coumadin AND Lovenox.  Continue Lovenox 80mg twice a day until next appt.  Take 1 1/2 tablets of Coumadin x 2 days then resume same dose of 1 tablet every day except 1 1/2 tablets on Sunday.       Prescriptions: ENOXAPARIN SODIUM 80 MG/0.8ML SOLN (ENOXAPARIN SODIUM) Inject 1 syringe subcutaneously two times a day as directed  #20 x 0   Entered by:   Sally Putt PharmD   Authorized by:   Scott Weaver PA-C   Signed by:   Sally Putt PharmD on 09/17/2010   Method used:   Electronically to        [id number] ([facility])       [address] [facility] [place], [place]  [address]       Ph: [phone]       Fax: [phone]   RxID:   [id number]

## 2010-10-21 NOTE — Medication Information (Signed)
Summary: ccn/going home on 7.5mg  daily per discharge/lg  Anticoagulant Therapy  Managed by: Weston Brass, PharmD Referring MD: Linus Orn MD: Graciela Husbands MD, Viviann Spare Indication 1: Atrial Fibrillation Indication 2: Cerebrovascular Accident Lab Used: [facility] [place] Site: [address] INR POC 2.9 INR RANGE 2.0-3.0   Health status changes: yes       Details: Pt reports new rash since staring medications.  All medications are new to him.  Being seen by Tereso Newcomer, PA   Recent/future hospitalizations: yes       Details: Pt admited from 12/8 - 12/13- new afib and cardioembolic stroke s/p tPA  Any changes in medication regimen? yes       Details: on prednisone taper for rash   Recent/future dental: no  Any missed doses?: no       Is patient compliant with meds? yes      Comments: Pt and wife educated on bleeding risks, dietary concerns, and medication interactions.   INR on discharge- 2.16.  Pt had recieved 5mg  x 1 then 7.5mg  daily (did skip one day) while in hospital.  Discharged on 7.5mg  daily.   Anticoagulation Management History:      The patient is taking warfarin and comes in today for a routine follow up visit.  Negative risk factors for bleeding include an age less than 46 years old.  The bleeding index is 'low risk'.  Negative CHADS2 values include Age > 53 years old.  Anticoagulation responsible provider: Graciela Husbands MD, Viviann Spare.  INR POC: 2.9.  Cuvette Lot#: [id number].  Exp: 10/2011.    Anticoagulation Management Assessment/Plan:      The target INR is 2.0-3.0.  The next INR is due 09/06/2010.  Anticoagulation instructions were given to patient.  Results were reviewed/authorized by Weston Brass, PharmD.  He was notified by Weston Brass PharmD.         Current Anticoagulation Instructions: INR 2.9  Start new dose of Coumadin: 1 1/2 tablets every day except 1 tablet on Tuesday, Thursday and Saturday.  Recheck INR in 1 week.

## 2010-10-21 NOTE — Progress Notes (Signed)
Summary: nuc pre-procedure  Phone Note Outgoing Call   Call placed by: Domenic Polite, CNMT,  September 15, 2010 11:16 AM Call placed to: Patient Reason for Call: Confirm/change Appt Summary of Call: Reviewed information on Myoview Information Sheet (see scanned document for further details).  Spoke with patient.       Nuclear Med Background Indications for Stress Test: Evaluation for Ischemia, Post Hospital  Indications Comments: 08/24/10 [facility]- CVA ( treated with TPA)  History: Echo  History Comments: 08/24/10 Echo EF= 15-20% / mod. to severe MR ; H/O A-Fib. / NVST , NICM     Nuclear Pre-Procedure Cardiac Risk Factors: CVA, Lipids Height (in): 74 Tech Comments: 08/24/10 -CVA  Nuclear Med Study Referring MD:  Graciela Husbands

## 2010-10-21 NOTE — Progress Notes (Addendum)
Summary: pt has questiosn about life vest  Phone Note Call from Patient Call back at Home Phone [phone]   Caller: Patient Reason for Call: Talk to Nurse, Talk to Doctor Summary of Call: pt is wondering about where we are with his life vest and he wants to talk to someone about it cause he can't afford to pay for it and may ahve to send it back because he is not sure if insurance is gonna pay or not Initial call taken by: Omer Jack,  October 11, 2010 3:35 PM  Follow-up for Phone Call        Mr. Bracknell reports that he spoke to [employer] and they have not submitted p/w to [employer] for the appeal yet. he is concerned because he cannot pay for the vest and insurance said it is about $3200 a month. He wants and answer today what he is suppose to do.  He stated that his insurance said he wouldn't have to pay if it was denied.  Follow-up by: Claris Gladden RN,  October 11, 2010 4:54 PM  Additional Follow-up for Phone Call Additional follow up Details #1::        adv Mr. Fitzsimmons that Dr. Graciela Husbands will call [employer] tomorrow. Mr. tolliver expressed his concern again with the cost of the vest. He states that [employer] acts like he owes them money and they said he signed a Community education officer. He states he did not sign the contract. Again he stated that his insurance said that he did not have to pay if sent to appeal.  Sounds like [employer] needs to get the appeal process going for the pt as soon as possible.  Additional Follow-up by: Claris Gladden RN,  October 11, 2010 6:07 PM    Additional Follow-up for Phone Call Additional follow up Details #2::    spoke w/pt and adv I will call [employer] on his behalf to see what we need to provide and that he in fact needs to wear the life vest. pt expressed understanding. 10/13/10 0820 10/13/10 0840 left msg for Danielle at [employer] [phone] ext [phone] to c/b to see what she needs for the appeal process. Claris Gladden, RN, BSN 10/13/10 1440 faxed p/w to [employer] at [phone] attn  danielle. faxed md & pa visits, lab results and test results from 12/13 to help w/appeal process. Claris Gladden, RN, BSN adv pt of info faxed and he adv again he cannot pay for this vest and that it was put on him without prior approval by the insurance company. left msg w/janet [employer] to c/b-[phone] ext [phone]. other [employer] contact is danielle [phone]  ext [phone].  Follow-up by: Claris Gladden RN,  October 13, 2010 2:49 PM   Appended Document: pt has questiosn about life vest Tresa Endo, do you have anyinsight as to waht happened and where we are going with this thanks steve  Appended Document: pt has questiosn about life vest spoke with Dr Graciela Husbands in regards to this

## 2010-10-21 NOTE — Medication Information (Signed)
Summary: rov/amr  Anticoagulant Therapy  Managed by: Weston Brass, PharmD Referring MD: Berton Mount, MD Supervising MD: Shirlee Latch MD, [place] Indication 1: Atrial Fibrillation Indication 2: Cerebrovascular Accident Lab Used: [facility] [place] Site: [address] INR POC 2.1 INR RANGE 2.0-3.0  Dietary changes: no    Health status changes: no    Bleeding/hemorrhagic complications: no    Recent/future hospitalizations: yes    Any changes in medication regimen? no    Recent/future dental: no  Any missed doses?: no       Is patient compliant with meds? yes      Comments: Resumed Coumadin on 1/10 after procedure  Allergies: No Known Drug Allergies  Anticoagulation Management History:      The patient is taking warfarin and comes in today for a routine follow up visit.  Negative risk factors for bleeding include an age less than 51 years old.  The bleeding index is 'low risk'.  Negative CHADS2 values include Age > 36 years old.  His last INR was 1.6.  Anticoagulation responsible provider: Shirlee Latch MD, [place].  INR POC: 2.1.  Cuvette Lot#: [id number].  Exp: 09/2011.    Anticoagulation Management Assessment/Plan:      The patient's current anticoagulation dose is Warfarin sodium 5 mg tabs: Use as directed by Anticoagulation Clinic.  The target INR is 2.0-3.0.  The next INR is due 10/19/2010.  Anticoagulation instructions were given to patient.  Results were reviewed/authorized by Weston Brass, PharmD.  He was notified by Stephannie Peters, PharmD Candidate .         Prior Anticoagulation Instructions: INR 1.6 (goal 2-3)  Take 1 1/2 tablets tonight (09/28/2010) and tomorrow (09/29/2010).  Then return to normal dosing schedule below, beginning this Thursday.  Next appointment: Wednesday, Jan. 18th at 9:00AM.    Current Anticoagulation Instructions: INR 2.1  Coumadin 5 mg tablets - Continue 1 tablet every day except 1.5 tablets on Sundays

## 2010-10-21 NOTE — Miscellaneous (Signed)
  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    a.  myoview 09/16/2010:  inferobasal scar and anteroapical ischemia with EF 40% Systolic congestive heart failure h/o Nocturnal bradycardia.  Nonsustained ventricular tachycardia . . .  LifeVest vest  Moderate-to-severe mitral regurgitation.  Cath 09/23/2010: no CAD (09/24/2010 9:39)       Past History:  Past Medical History: s/p acute left MCA stroke 12/6 treated with TPA   a.  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    a.  myoview 09/16/2010:  inferobasal scar and anteroapical ischemia with EF 40% Systolic congestive heart failure h/o Nocturnal bradycardia.  Nonsustained ventricular tachycardia . . .  LifeVest vest  Moderate-to-severe mitral regurgitation.  Cath 09/23/2010: no CAD

## 2010-10-21 NOTE — Progress Notes (Signed)
Summary: question re life vest  Phone Note Call from Patient Call back at Home Phone [phone]   Caller: Patient Reason for Call: Talk to Nurse Summary of Call: pt states he wearing a life vest. pt states recieve a letter from his insurance stating the life vest  is not covered. pt wants to know what should he do.  Initial call taken by: Roe Coombs,  September 22, 2010 11:09 AM  Follow-up for Phone Call        spoke with jim from [employer], he is looking into insurance issues. pt aware Deliah Goody, RN  September 22, 2010 4:16 PM

## 2010-10-21 NOTE — Progress Notes (Signed)
Summary: pt has question re life vest  Phone Note Call from Patient   Caller: Patient [phone] Reason for Call: Talk to Nurse Summary of Call: pt calling re insurance not covering his life vest-does he need to continue to wear it? Initial call taken by: Glynda Jaeger,  September 27, 2010 9:46 AM  Follow-up for Phone Call        Aubery Lapping pt that [employer] is investigating the issue.  Follow-up by: Claris Gladden RN,  September 27, 2010 2:02 PM

## 2010-10-21 NOTE — Progress Notes (Signed)
Summary: allergic reation   Phone Note Call from Patient   Caller: Spouse [phone] JUDITH Reason for Call: Talk to Nurse Summary of Call: pt having allergic reaction-talked to [facility] bradley pa yesterday, she told him to take benadryl, took one strip and one caplet, not helping-pls advise  Initial call taken by: Glynda Jaeger,  September 02, 2010 8:33 AM  Follow-up for Phone Call        pt stated that he initially started itching where his IVs were yesterday and then put on the life vest and now has red rash and welts on torso and arms. mainly on the front of the torso. pt states that all medicine prescribed in hospital is new. pt denies having reaction to meds in hospital.  he did take benadryl allergy last night w/o relief. he has showered with eucerin calming wash and is still itching. pt states that wearing the life vest does make it itch worse.  adv pt will discuss w/Dr. Graciela Husbands and adv.  Follow-up by: Claris Gladden RN,  September 02, 2010 8:48 AM  Additional Follow-up for Phone Call Additional follow up Details #1::        spoke w/PA and he will see pt. adv pt and they will come in right now.  Additional Follow-up by: Claris Gladden RN,  September 02, 2010 8:59 AM

## 2010-10-21 NOTE — Assessment & Plan Note (Signed)
Summary: rash torso-life vest or med cause   Visit Type:  eph Referring Provider:  Sherryl Manges  CC:  allergic reaction to life vest.  History of Present Illness: Primary Electrophysiologist:  Dr. Sherryl Manges  Tyler Sims is a 55 yo male who presented to [facility] on 12/6 with an acute left MCA distribution CVA.  He was treated emergently with intra-arterial tPA and thrmobectomy.  He was noted to be in AFib with RVR and an echo demonstrated reduced LVF with an EF 15-20%.  He was presumed to have a nonischemic cardiomyopathy.  He was treated for systolic CHF.  He was felt to have an embolic CVA and was placed on coumadin.  He was started on beta blocker, ace inhibitor, digoxin.  He was also placed on simvastatin.  He was noted to have NSVT and was placed on a lifevest.  He has an outpatient myoview pending to rule out ischemic heart disease.  He called in today with a rash and was added on to my schedule.  The rash started shortly after coming home from the hospital 2 days ago.  It is located on his torso, upper arms and groin.  It is significantly pruritic.  He denies any lip or tongue swelling.  He denies difficulty breathing or swallowing.  He denies fevers.  He denies chest pain.  He denies syncope.  His Life vest has not shocked him.   Preventive Screening-Counseling & Management  Alcohol-Tobacco     Alcohol drinks/day: 0     Smoking Status: never  Caffeine-Diet-Exercise     Caffeine use/day: occasionally     Does Patient Exercise: no  Current Medications (verified): 1)  Warfarin Sodium 5 Mg Tabs (Warfarin Sodium) .... Use As Directed By Anticoagulation Clinic 2)  Simvastatin 20 Mg Tabs (Simvastatin) .... Take One Tablet By Mouth Daily At Bedtime 3)  Lisinopril 2.5 Mg Tabs (Lisinopril) .... Take One Tablet By Mouth Daily 4)  Metoprolol Succinate 50 Mg Xr24h-Tab (Metoprolol Succinate) .... Take One Tablet By Mouth Daily 5)  Digoxin 0.25 Mg Tabs (Digoxin) .... Take One Tablet By Mouth  Daily  Allergies (verified): No Known Drug Allergies  Past History:  Past Medical History: Last updated: 09/02/2010 s/p acute left MCA stroke 12/6 treated with TPA   a.  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  Systolic congestive heart  h/o Nocturnal bradycardia.  Nonsustained ventricular tachycardia . . .  LifeVest vest  Moderate-to-severe mitral regurgitation.   Social History: Last updated: 09/02/2010 The patient has been  married for 29 years, has two children.  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   Family History:  The patient's father died of choking on food in his   7s.   Mother died of all Alzheimer's in her 13s.   Brother deceased 45 from Lung Cancer Brother living age 85 HTN, A-fib  Brother living  age 70; HTN, hyperlipidemia Sister living age 80; HTN  Social History: Alcohol drinks/day:  0 Smoking Status:  never Caffeine use/day:  occasionally Does Patient Exercise:  no  Review of Systems       As per  the HPI.  All other systems reviewed and negative.   Vital Signs:  Patient profile:   55 year old male Height:      6.2 inches Weight:      220.75 pounds BMI:     4052.16 Pulse  rate:   78 / minute Pulse rhythm:   irregular BP sitting:   126 / 76  (left arm) Cuff size:   regular  Vitals Entered By: Mercer Pod (September 02, 2010 10:00 AM)  Physical Exam  General:  Well nourished, well developed, in no acute distress HEENT: normal Neck: no JVD Cardiac:  normal S1, S2; irreg irreg rhythm; no murmur Lungs:  clear to auscultation bilaterally, no wheezing, rhonchi or rales Abd: soft, nontender, no hepatomegaly Ext: no edema Lymph: no cervical adenopathy Endo: no thyromegaly Skin: diffuse, palpable macular rash that blanches about his torso, upper arms, groin with excoriations Neuro:  CNs 2-12 intact, no focal abnormalities  noted    EKG  Procedure date:  09/02/2010  Findings:      Atrial Fibrillation Heart rate 78 Normal axis T-wave inversions in V4-V6  Impression & Recommendations:  Problem # 1:  SKIN RASH, ALLERGIC (ICD-692.9) This appears to be a fixed drug eruption. I do not think it is contact dermatitis.   Every medication he is on is new.  The most likely culprits are the dye in the warfarin tabs or lisinopril. He remembers getting dye free coumadin tabs in the hospital.  He states he started on the peach colored tabs after the rash started. I will stop his Lisinopril. I will also stop his Simvastatin. I will put him on a short course of prednisone. He will also take pepcid and benadryl. I will have him follow up with me in several days. He knows to go to the emergency room if he feels worse. Check CBC.  Problem # 2:  ATRIAL FIBRILLATION (ICD-427.31)  Rate controlled. Hopefully, we will not have to change his rate controlling medications. We will check his INR  today and he will have close follow up with the coumadin clinic with the addition of prednisone.  Orders: EKG w/ Interpretation (93000) TLB-Hepatic/Liver Function Pnl (80076-HEPATIC)  Problem # 3:  CARDIOMYOPATHY, SECONDARY (ICD-425.9)  Possibly NICM. He has a myoview study pending. Check BMET.  Problem # 4:  VENTRICULAR TACHYCARDIA (ICD-427.1) I had the EP nurse come and look at his vest today. The rash is causing significant irritation. I explained to him the importance of his LifeVest. The company rec. hydrocortisone cream or 100% corn starch under his vest only as needed for irritation.  Problem # 5:  TRANSAMINASES, SERUM, ELEVATED (ICD-790.4) Noted in hospital. Recheck LFTs today.  Other Orders: TLB-BMP (Basic Metabolic Panel-BMET) (80048-METABOL) TLB-CBC Platelet - w/Differential (85025-CBCD)  Patient Instructions: 1)  Your physician has recommended you make the following change in your medication:  2)   STOP the LISINOPRIL 3)  STOP the SIMVASTATIN 4)  Take Hydroxyzine every 6-8 hours around the clock for 24 hours.  Then, take it every 6-8 hours as needed for itching. 5)  Take the Pepcid two times a day for one week. 6)  Take the Prednisone until it is all gone as directed. 7)  I have sent these medicines to your pharmacy. 8)  If you feel like the rash is getting worse, you have difficulty breathing, develop tongue swelling or lip swelling, you should call 911. 9)  If needed, you can get Hydrocortisone CREAM 1% (DO NOT GET OINTMENT) and apply to itchy areas once daily to two times a day. 10)  You can also use 100% Corn Starch (like you cook with) and rub on itchy areas under vest if you need it. 11)  Your physician recommends that you schedule a follow-up appointment  in: YOU HAVE AN APPOINTMENT ON MONDAY 09/06/10 @ 2:30 PM TO SEE SCOTT WEAVER, PA 12)  Your physician recommends that you return for lab work in: TODAY BMET, CBC, LFT. 13)  YOU WILL BE SEEING OUR COUMADIN CLINIC TODAY TO CHECK YOUR INR, YOUR APPOINTMENT FOR TOMORROW WITH COUMADIN WILL BE CANCELED Prescriptions: PREDNISONE 10 MG TABS (PREDNISONE) Take 3 tabs today, 3 tabs on Fri., 3 tabs on Sat., 2 tabs on Sun., 2 tabs on Mon., 1 Tab on Tues., 1 tab on Wed. and stop  #15 x 0   Entered and Authorized by:   Tereso Newcomer PA-C   Signed by:   Tereso Newcomer PA-C on 09/02/2010   Method used:   [facility] to        [facility] [phone]* (retail)       [address]       [place], [place]  [address]       Ph: [phone]       Fax: [phone]   RxID:   [id number] HYDROXYZINE HCL 25 MG TABS (HYDROXYZINE HCL) Take one by mouth every 6-8 hours as needed  #30 x 1   Entered and Authorized by:   Tereso Newcomer PA-C   Signed by:   Tereso Newcomer PA-C on 09/02/2010   Method used:   Electronically to        [facility] [phone]* (retail)       [address]       [place], [place]  [address]       Ph: [phone]       Fax: [phone]   RxID:   [id number] PEPCID 20 MG TABS (FAMOTIDINE) Take 1 tablet by mouth two times a day  #60 x 1   Entered and Authorized by:   Tereso Newcomer PA-C   Signed by:   Tereso Newcomer PA-C on 09/02/2010   Method used:   [facility]ronically to        [facility] [phone]* (retail)       [address]       [place], [place]  [address]       Ph: [phone]       Fax: [phone]   RxID:   [id number]

## 2010-10-21 NOTE — Letter (Signed)
Summary: Cardioversion/TEE Instructions  Arch[facility], Main Office  [address]   [place], [place] [address]   Phone: [phone]  Fax: [phone]    Cardioversion Cardioversion Instructions  10/06/2010 MRN: [id number]  Tyler Sims [address] [place], [place]  [address]  Dear Mr. Touch, You are scheduled for a Cardioversion  on October 28, 2009 at 10:00 am with Dr. Graciela Husbands.     Please arrive at the [facility] at 8:00 am on the day of your procedure.  1)   DIET:  A)   Nothing to eat or drink after midnight except your medications with a sip of water.  2)   Come to the [place] office on October 21, 2010 at 4:30 pmfor lab work. The lab at [facility] is open from 8:30 a.m. to 1:30 p.m. and 2:30 p.m. to 5:00 p.m. The lab at [facility] is open from 7:30 a.m. to 5:30 p.m. You do not have to be fasting.  3)   MAKE SURE YOU TAKE YOUR COUMADIN.  4)   A)   DO NOT TAKE these medications before your procedure:      B)   YOU MAY TAKE ALL of your remaining medications with a small amount of water.    C)   START NEW medications:       N/A  5)  Must have a responsible person to drive you home.  6)   Bring a current list of your medications and current insurance cards.   * Special Note:  Every effort is made to have your procedure done on time. Occasionally there are emergencies that present themselves at the hospital that may cause delays. Please be patient if a delay does occur.  * If you have any questions after you get home, please call the office at [phone]. 'Claris Gladden, RN

## 2010-10-21 NOTE — Assessment & Plan Note (Signed)
Summary: post discharge and pt has echo prior to appt/lg   Visit Type:  follow up-echo Referring Provider:  Sherryl Manges   History of Present Illness:  Tyler Sims is seen in followup for atrial fibrillation. He presented in December with a  acute left MCA distribution CVA treated emergently with intra-arterial tPA and thrmobectomy.  an echo demonstrated reduced LVF with an EF 15-20%.  He was presumed to have a nonischemic cardiomyopathy.  The CVA was thought to be embolic and he was placed on coumadin.  He was noted to have NSVT and was placed on a lifevest.   He was admitted for Myoview scanning to eliminate coronary disease as a contributor to his myopathy. .  His myoview is markedly abnormal.  His EF is better on nuclear study at 40%.  However, he has evidence for inferobasal infarct with anteroapical ischemia; these findings weresuggestive of multivessel CAD. He wasreferred for catheterization demonstrating nonobstructive coronary disease ejection fraction had improved to 30-35% range. Have moderate-severe mitral regurgitation.  His Coumadin had been interrupted as part of the procedure. He comes in now and his first therapeutic INR is today. His symptoms are markedly improved. He had no problems with exercise intolerance. His biggest issue is tolerating his life vest.  catheters a fair amount of tension related to the change in lifestyle.     Problems Prior to Update: 1)  Nonspecific Abnormal Unspec Cv Function Study  (ICD-794.30) 2)  Transaminases, Serum, Elevated  (ICD-790.4) 3)  Skin Rash, Allergic  (ICD-692.9) 4)  Cardiomyopathy, Secondary  (ICD-425.9) 5)  Ventricular Tachycardia  (ICD-427.1) 6)  Atrial Fibrillation  (ICD-427.31)  Current Medications (verified): 1)  Warfarin Sodium 5 Mg Tabs (Warfarin Sodium) .... Use As Directed By Anticoagulation Clinic 2)  Metoprolol Succinate 50 Mg Xr24h-Tab (Metoprolol Succinate) .... Take One Tablet By Mouth Daily 3)  Digoxin 0.25 Mg  Tabs (Digoxin) .... Take 1/2  Tablet By Mouth Daily 4)  Pepcid 20 Mg Tabs (Famotidine) .... Take 1 Tablet By Mouth Two Times A Day  Allergies (verified): 1)  ! Lisinopril 2)  ! Simvastatin  Past History:  Past Medical History: Last updated: 09/24/2010 s/p acute left MCA stroke 12/6 treated with TPA   a.  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    a.  myoview 09/16/2010:  inferobasal scar and anteroapical ischemia with EF 40% Systolic congestive heart failure h/o Nocturnal bradycardia.  Nonsustained ventricular tachycardia . . .  LifeVest vest  Moderate-to-severe mitral regurgitation.  Cath 09/23/2010: no CAD  Past Surgical History: Last updated: 25-Sep-2010 Unremarkable  Family History: Last updated: 25-Sep-2010  The patient's father died of choking on food in his   85s.   Mother died of all Alzheimer's in her 74s.   Brother deceased 55 from Lung Cancer Brother living age 24 HTN, A-fib  Brother living  age 73; HTN, hyperlipidemia Sister living age 24; HTN  Social History: Last updated: 25-Sep-2010 The patient has been  married for 29 years, has two children.  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   Risk Factors: Alcohol Use: 0 (09-25-10) Caffeine Use: occasionally (Sep 25, 2010) Exercise: no (25-Sep-2010)  Risk Factors: Smoking Status: never (09-25-2010)  Vital Signs:  Patient profile:   55 year old male Height:      74 inches Weight:      197.50 pounds BMI:     25.45 Pulse rate:  63 / minute BP sitting:   133 / 74  (left arm) Cuff size:   regular  Vitals Entered By: Caralee Ates CMA (October 06, 2010 10:26 AM)  Physical Exam  General:  The patient was alert and oriented in no acute distress. HEENT Normal.  Neck veins were flat, carotids were brisk.  Lungs were clear.  Heart sounds were regular with 26 murmur Abdomen was soft with  active bowel sounds. There is no clubbing cyanosis or edema. Skin Warm and dry]   EKG  Procedure date:  10/06/2010  Findings:      atrial fibrillation at 60 intervals-/0.09/0.39 Repolarization abnormalities in the inferolateral leads  Impression & Recommendations:  Problem # 1:  NONSPECIFIC ABNORMAL UNSPEC CV FUNCTION STUDY (ICD-794.30) his abnormal Myoview was found to be a false positive. Coronary arteries were normal. Ejection fraction was 35% with moderate-severe mitral regurgitation  Problem # 2:  CARDIOMYOPATHY, SECONDARY (ICD-425.9) it is not clear yet what the cardiomyopathy as I suspect it was multi factorial with a tachycardia component and perhaps a primary component the relationship with his mitral regurgitation is not yet clear; specifically that he has significant MR and as is the cause of a myopathy or if the MR made worse by the atrial fibrillation.  we will begin him on a diabetes; we'll be checking a metabolic profile and he comes in for his cardioversion  To clarify this we'll plan ro do DCCV in threee week. He'll then be given for 6 weeks to have his atrium he'll and repeat echo will be done to look at his mitral valve. The following medications were removed from the medication list:    Enoxaparin Sodium 80 Mg/0.38ml Soln (Enoxaparin sodium) ..... Inject 1 syringe subcutaneously two times a day as directed His updated medication list for this problem includes:    Warfarin Sodium 5 Mg Tabs (Warfarin sodium) ..... Use as directed by anticoagulation clinic    Metoprolol Succinate 50 Mg Xr24h-tab (Metoprolol succinate) .Marland Kitchen... Take one tablet by mouth daily    Digoxin 0.25 Mg Tabs (Digoxin) .Marland Kitchen... Take 1/2  tablet by mouth daily    Losartan Potassium 25 Mg Tabs (Losartan potassium) ..... Once daily  Problem # 3:  VENTRICULAR TACHYCARDIA (ICD-427.1) he remains on his LIFE VESTt The following medications were removed from the medication list:    Enoxaparin Sodium 80  Mg/0.23ml Soln (Enoxaparin sodium) ..... Inject 1 syringe subcutaneously two times a day as directed His updated medication list for this problem includes:    Warfarin Sodium 5 Mg Tabs (Warfarin sodium) ..... Use as directed by anticoagulation clinic    Metoprolol Succinate 50 Mg Xr24h-tab (Metoprolol succinate) .Marland Kitchen... Take one tablet by mouth daily  Problem # 4:  ATRIAL FIBRILLATION (ICD-427.31) persistent as noted above His updated medication list for this problem includes:    Warfarin Sodium 5 Mg Tabs (Warfarin sodium) ..... Use as directed by anticoagulation clinic    Metoprolol Succinate 50 Mg Xr24h-tab (Metoprolol succinate) .Marland Kitchen... Take one tablet by mouth daily    Digoxin 0.25 Mg Tabs (Digoxin) .Marland Kitchen... Take 1/2  tablet by mouth daily Prescriptions: LOSARTAN POTASSIUM 25 MG TABS (LOSARTAN POTASSIUM) once daily  #90 x 3   Entered by:   Claris Gladden RN   Authorized by:   Nathen May, MD, [facility]   Signed by:   Claris Gladden RN on 10/06/2010   Method used:   Electronically to        [facility] [phone]* (retail)  [address]       [place], [place]  [address]       Ph: [phone]       Fax: [phone]   RxID:   [id number]   Appended Document: post discharge and pt has echo prior to appt/lg    Clinical Lists Changes  Observations: Added new observation of PI CARDIO: Your physician has recommended you make the following change in your medication: Losartan Your physician has requested that you have a TEE/Cardioversion.  During a TEE, sound waves are used to create images of your heart. It provides your doctor with information about the size and shape of your heart and how well your heart's chambers and valves are working. In this test, a transducer is attached to the end of a flexible tube that is guided down your throat and into your esophagus (the tube leading from your mouth to your stomach) to get a more detailed image of your  heart. Once the TEE has determined that a blood clot is not present, the cardioversion begins.  Electrical cardioversion uses a jolt of electricity to your heart either through paddles or wired patches attached to your chest. This is a controlled, usually prescheduled, procedure. This procedure is done at the hospital and you are not awake during the procedure.  You usually go home the day of the procedure. Please see the instruction sheet given to you today for more information. (10/06/2010 11:53)       Patient Instructions: 1)  Your physician has recommended you make the following change in your medication: Losartan 2)  Your physician has requested that you have a TEE/Cardioversion.  During a TEE, sound waves are used to create images of your heart. It provides your doctor with information about the size and shape of your heart and how well your heart's chambers and valves are working. In this test, a transducer is attached to the end of a flexible tube that is guided down your throat and into your esophagus (the tube leading from your mouth to your stomach) to get a more detailed image of your heart. Once the TEE has determined that a blood clot is not present, the cardioversion begins.  Electrical cardioversion uses a jolt of electricity to your heart either through paddles or wired patches attached to your chest. This is a controlled, usually prescheduled, procedure. This procedure is done at the hospital and you are not awake during the procedure.  You usually go home the day of the procedure. Please see the instruction sheet given to you today for more information.

## 2010-10-21 NOTE — Letter (Signed)
Summary: Cardiac Catheterization Instructions- Main Lab  [employer], Main Office  [address]   [place], [place] [address]   Phone: [phone]  Fax: [phone]     09/17/2010 MRN: [id number]  Jervis Tafoya [address] [place], [place]  [address]  Dear Mr. Bon,   You are scheduled for Cardiac Catheterization on 09/23/2010 with Dr. Riley Kill           .  Please arrive at the [facility] at 7 am       on the day of your procedure.  1. DIET     __XXX__ Nothing to eat or drink after midnight except your medications with a sip of water.   3. MAKE SURE YOU TAKE YOUR ASPIRIN.      _xxxx___ YOU MAY TAKE ALL of your remaining medications with a small amount of water.      5. Plan for one night stay - bring personal belongings (i.e. toothpaste, toothbrush, etc.)  6. Bring a current list of your medications and current insurance cards.  7. Must have a responsible person to drive you home.   8. Someone must be with yu for the first 24 hours after you arrive home.  9. Please wear clothes that are easy to get on and off and wear slip-on shoes.  *Special note: Every effort is made to have your procedure done on time.  Occasionally there are emergencies that present themselves at the hospital that may cause delays.  Please be patient if a delay does occur.  If you have any questions after you get home, please call the office at the number listed above.  Danielle Rankin, CMA

## 2010-10-21 NOTE — Medication Information (Signed)
Summary: rov/sp  Anticoagulant Therapy  Managed by: Weston Brass, PharmD Referring MD: Linus Orn MD: Tenny Craw MD, Gunnar Fusi Indication 1: Atrial Fibrillation Indication 2: Cerebrovascular Accident Lab Used: [facility] [place] Site: [address] INR RANGE 2.0-3.0  Dietary changes: no    Health status changes: no    Bleeding/hemorrhagic complications: no    Recent/future hospitalizations: no    Any changes in medication regimen? no    Recent/future dental: no  Any missed doses?: no       Is patient compliant with meds? yes       Allergies: No Known Drug Allergies  Anticoagulation Management History:      Negative risk factors for bleeding include an age less than 32 years old.  The bleeding index is 'low risk'.  Negative CHADS2 values include Age > 76 years old.  Today's INR is 3.2.  Anticoagulation responsible provider: Tenny Craw MD, Gunnar Fusi.  Cuvette Lot#: [id number].  Exp: 09/2011.    Anticoagulation Management Assessment/Plan:      The patient's current anticoagulation dose is Warfarin sodium 5 mg tabs: Use as directed by Anticoagulation Clinic.  The target INR is 2.0-3.0.  The next INR is due 09/16/2010.  Anticoagulation instructions were given to patient.  Results were reviewed/authorized by Weston Brass, PharmD.         Prior Anticoagulation Instructions: INR 2.9  Start new dose of Coumadin: 1 1/2 tablets every day except 1 tablet on Tuesday, Thursday and Saturday.  Recheck INR in 1 week.   Current Anticoagulation Instructions: INR 3.2 Take 1 tablet (5mg ) today instead of 1.5 tablet Take 1.5 tablet (7.5mg ) on Sun, Wed, and Fri and take 1 tablet (5mg ) the rest of the days. Recheck INR on Dec. 29

## 2010-10-21 NOTE — Assessment & Plan Note (Signed)
Summary: Abnormal Myoview   Referring Provider:  Sherryl Manges   History of Present Illness: Primary Electrophysiologist:  Dr. Sherryl Manges  Tyler Sims is a 55 yo male who presented to [facility] on 12/6 with an acute left MCA distribution CVA treated emergently with intra-arterial tPA and thrmobectomy.  He was noted to be in AFib with RVR and an echo demonstrated reduced LVF with an EF 15-20%.  He was presumed to have a nonischemic cardiomyopathy.  The CVA was thought to be embolic and he was placed on coumadin.  He was noted to have NSVT and was placed on a lifevest.  He was seen recently for a rash and had his ACE and statin were stopped with improvement in his rash.  He returns for follow up today.  We planned to get him on an ARB today.  Also, he had a myoview done yesterday to rule out ischemic heart disease as a cause for his myopathy.  His myoview is markedly abnormal.  His EF is better on nuclear study at 40%.  However, he has evidence for inferobasal infarct with anteroapical ischemia.  Findings are suggestive of multivessel CAD.  He denies any further rash.  He does note some chest discomfort he describes as a pressure at times.  This seems to occur late in the evening when he is tired.  He also has some symptoms of acid reflux.  He has stayed on the pepcid since treatment for his rash.  This has helped.  He denies exertional chest pain or dyspnea.  He actually notes more DOE before his stroke.  He denies orthopnea or PND or edema.  No synocpe or therapies from his LifeVest.  He does feel lightheaded at times.  Current Medications (verified): 1)  Warfarin Sodium 5 Mg Tabs (Warfarin Sodium) .... Use As Directed By Anticoagulation Clinic 2)  Metoprolol Succinate 50 Mg Xr24h-Tab (Metoprolol Succinate) .... Take One Tablet By Mouth Daily 3)  Digoxin 0.25 Mg Tabs (Digoxin) .... Take One Tablet By Mouth Daily 4)  Pepcid 20 Mg Tabs (Famotidine) .... Take 1 Tablet By Mouth Two Times A  Day  Allergies (verified): No Known Drug Allergies  Past History:  Past Medical History: s/p acute left MCA stroke 12/6 treated with TPA   a.  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    a.  myoview 09/16/2010:  inferobasal scar and anteroapical ischemia with EF 40% Systolic congestive heart failure h/o Nocturnal bradycardia.  Nonsustained ventricular tachycardia . . .  LifeVest vest  Moderate-to-severe mitral regurgitation.   Family History: Reviewed history from 09/02/2010 and no changes required.  The patient's father died of choking on food in his   5s.   Mother died of all Alzheimer's in her 22s.   Brother deceased 70 from Lung Cancer Brother living age 24 HTN, A-fib  Brother living  age 75; HTN, hyperlipidemia Sister living age 67; HTN  Social History: Reviewed history from 09/02/2010 and no changes required. The patient has been  married for 29 years, has two children.  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   Review of Systems       As per  the HPI.  All other systems reviewed and negative.   Vital Signs:  Patient profile:   55 year old male Height:      74 inches Weight:      199  pounds BMI:     25.64 Pulse rate:   61 / minute Resp:     16 per minute BP sitting:   123 / 68  (right arm)  Vitals Entered By: Marrion Coy, CNA (September 17, 2010 11:59 AM)  Physical Exam  General:  Well nourished, well developed in no acute distress HEENT: Normal Neck: No JVD or HJR Cardiac:  Normal S1, S2; irreg irreg rhythm Lungs:  Clear to auscultation bilaterally, no wheezing, rhonchi or rales Abd: Soft, nontender, no hepatomegaly Ext: No  edema Vascular: Femoral arteries 2+ bilaterally without bruits Skin: Warm and dry MSK:  No deformities Lymph: No adenopathy Endocrine:  No thyromegaly Neuro: CNs 2-12 intact;  nonfocal    EKG  Procedure date:  09/17/2010  Findings:      Atrial Fibrillation Heart rate 52 T wave inversion in lead aVF, V5 and V6  Impression & Recommendations:  Problem # 1:  NONSPECIFIC ABNORMAL UNSPEC CV FUNCTION STUDY (ICD-794.30)  The patient requires cardiac catheterization.  Risks and benefits have been discussed with the patient and available family members.  Risks include but are not limited to death, heart attack, stroke, bleeding, infection or allergic reaction from the dye.  The patient is willing to accept these risks and proceed with catheterization.   We will try to do his procedure in the inpatient lab should he require intervention in light of his need for Lovenox bridging.  Orders: Cardiac Catheterization (Cardiac Cath)  Problem # 2:  CARDIOMYOPATHY, SECONDARY (ICD-425.9)  I wanted to start Losartan today. However, with the need for cath, will hold off on starting this for now.  Will need to start once his procedure is completed.  Orders: TLB-BMP (Basic Metabolic Panel-BMET) (80048-METABOL) TLB-CBC Platelet - w/Differential (85025-CBCD)  Problem # 3:  VENTRICULAR TACHYCARDIA (ICD-427.1)  On LifeVest.  Orders: T-Digoxin (04540-98119)  Problem # 4:  ATRIAL FIBRILLATION (ICD-427.31)  With h/o stroke, he will need Lovenox bridging for his cath.  Will try to coordinate this with our coumadin clinic and tentatively plan on cath next Wed or Thurs.  His HR is low and I will cut his digoxin in half to 0.125 mg once daily.  Orders: EKG w/ Interpretation (93000) TLB-PT (Protime) (85610-PTP) TLB-PTT (85730-PTTL) T-Digoxin ([id number])  Patient Instructions: 1)  Your physician recommends that you return for lab work in: TODAY FOR BMET 425.9, CBC 425.9, PT 427.31, PTT 427.31 2)  Your physician has requested that you have a cardiac catheterization ON 09/23/2010 @ 9 AM, YOU WILL NEED TO BE THERE 2 HOURS EARLY (7AM).  Cardiac catheterization is used to  diagnose and/or treat various heart conditions. Doctors may recommend this procedure for a number of different reasons. The most common reason is to evaluate chest pain. Chest pain can be a symptom of coronary artery disease (CAD), and cardiac catheterization can show whether plaque is narrowing or blocking your heart's arteries. This procedure is also used to evaluate the valves, as well as measure the blood flow and oxygen levels in different parts of your heart.  For further information please visit [email or web address]  Please follow instruction sheet, as given. 3)  Your physician has recommended you make the following change in your medication: Rutger Salton, PA WOULD LIKE FOR YOU TO START TAKING 1/2 TABLET OF YOUR DIGOXIN

## 2010-10-22 LAB — BASIC METABOLIC PANEL
BUN: 17 mg/dL (ref 6–23)
Chloride: 112 mEq/L (ref 96–112)
Creatinine, Ser: 1.2 mg/dL (ref 0.4–1.5)
GFR: 79.86 mL/min (ref >60.00)

## 2010-10-22 LAB — APTT: aPTT: 32.7 s — ABNORMAL HIGH (ref 21.7–28.8)

## 2010-10-22 LAB — CBC WITH DIFFERENTIAL/PLATELET
Basophils Relative: 0.6 % (ref 0.0–3.0)
Eosinophils Relative: 0.6 % (ref 0.0–5.0)
Lymphocytes Relative: 25.7 % (ref 12.0–46.0)
MCV: 96.1 fl (ref 78.0–100.0)
Monocytes Relative: 10.6 % (ref 3.0–12.0)
Neutrophils Relative %: 62.5 % (ref 43.0–77.0)
Platelets: 223 10*3/uL (ref 150.0–400.0)
RBC: 4.56 Mil/uL (ref 4.22–5.81)
WBC: 6.3 10*3/uL (ref 4.5–10.5)

## 2010-10-22 LAB — PROTIME-INR
INR: 2.6 ratio — ABNORMAL HIGH (ref 0.8–1.0)
Prothrombin Time: 26.7 s — ABNORMAL HIGH (ref 10.2–12.4)

## 2010-10-26 ENCOUNTER — Encounter: Payer: Self-pay | Admitting: Occupational Therapy

## 2010-10-27 NOTE — Miscellaneous (Signed)
Summary: Orders Update  Clinical Lists Changes  Orders: Added new Test order of TLB-BMP (Basic Metabolic Panel-BMET) (80048-METABOL) - Signed Added new Test order of TLB-CBC Platelet - w/Differential (85025-CBCD) - Signed Added new Test order of TLB-PTT (85730-PTTL) - Signed Added new Test order of TLB-PT (Protime) (85610-PTP) - Signed 

## 2010-10-27 NOTE — Medication Information (Signed)
Summary: CCR ROV SP/TMJ  Anticoagulant Therapy  Managed by: Weston Brass, PharmD Referring MD: Berton Mount, MD Supervising MD: Shirlee Latch MD, Felipe Cabell Indication 1: Atrial Fibrillation Indication 2: Cerebrovascular Accident Lab Used: [facility] [place] Site: [address] INR POC 2.4 INR RANGE 2.0-3.0  Dietary changes: no    Health status changes: no    Bleeding/hemorrhagic complications: no    Recent/future hospitalizations: yes       Details: Cardioversion scheduled for 2/9  Any changes in medication regimen? no    Recent/future dental: no  Any missed doses?: no       Is patient compliant with meds? yes       Allergies: 1)  ! Lisinopril 2)  ! Simvastatin  Anticoagulation Management History:      The patient is taking warfarin and comes in today for a routine follow up visit.  Negative risk factors for bleeding include an age less than 39 years old.  The bleeding index is 'low risk'.  Negative CHADS2 values include Age > 90 years old.  His last INR was 1.6.  Anticoagulation responsible provider: Shirlee Latch MD, Kainon Varady.  INR POC: 2.4.  Cuvette Lot#: [id number].  Exp: 09/2011.    Anticoagulation Management Assessment/Plan:      The patient's current anticoagulation dose is Warfarin sodium 5 mg tabs: Use as directed by Anticoagulation Clinic.  The target INR is 2.0-3.0.  The next INR is due 11/04/2010.  Anticoagulation instructions were given to patient.  Results were reviewed/authorized by Weston Brass, PharmD.  He was notified by Margot Chimes PharmD Candidate.         Prior Anticoagulation Instructions: INR 2.0  Coumadin 5mg  tablets - Continue 1 tablet every day except 1.5 tablets on Sundays   Current Anticoagulation Instructions: INR: 2.4  Increase coumadin dose to 1 tablet (5mg ) everyday except 1 1/2 tablets (7.5mg ) on Sundays and Thursdays.

## 2010-10-28 ENCOUNTER — Encounter: Payer: Self-pay | Admitting: Occupational Therapy

## 2010-10-28 ENCOUNTER — Ambulatory Visit (HOSPITAL_COMMUNITY)
Admission: RE | Admit: 2010-10-28 | Discharge: 2010-10-28 | Disposition: A | Discharge status: Home / Self Care | Payer: Managed Care, Other (non HMO) | Source: Ambulatory Visit | Attending: Internal Medicine | Admitting: Internal Medicine

## 2010-10-28 DIAGNOSIS — I4891 Unspecified atrial fibrillation: Secondary | ICD-10-CM

## 2010-10-28 DIAGNOSIS — I509 Heart failure, unspecified: Secondary | ICD-10-CM | POA: Insufficient documentation

## 2010-10-28 LAB — BASIC METABOLIC PANEL
BUN: 12 mg/dL (ref 6–23)
Creatinine, Ser: 1.18 mg/dL (ref 0.4–1.5)
GFR calc non Af Amer: >60 mL/min (ref >60)
Glucose, Bld: 135 mg/dL — ABNORMAL HIGH (ref 70–99)
Potassium: 4.7 mEq/L (ref 3.5–5.1)

## 2010-10-28 LAB — PROTIME-INR
INR: 2.19 — ABNORMAL HIGH (ref 0.00–1.49)
Prothrombin Time: 24.5 seconds — ABNORMAL HIGH (ref 11.6–15.2)

## 2010-11-01 ENCOUNTER — Telehealth: Payer: Self-pay | Admitting: Internal Medicine

## 2010-11-01 DIAGNOSIS — Z7901 Long term (current) use of anticoagulants: Secondary | ICD-10-CM | POA: Insufficient documentation

## 2010-11-01 DIAGNOSIS — I639 Cerebral infarction, unspecified: Secondary | ICD-10-CM | POA: Insufficient documentation

## 2010-11-01 DIAGNOSIS — I4891 Unspecified atrial fibrillation: Secondary | ICD-10-CM

## 2010-11-02 ENCOUNTER — Telehealth: Payer: Self-pay | Admitting: Internal Medicine

## 2010-11-04 ENCOUNTER — Encounter: Payer: Self-pay | Admitting: Occupational Therapy

## 2010-11-04 ENCOUNTER — Encounter (INDEPENDENT_AMBULATORY_CARE_PROVIDER_SITE_OTHER): Payer: Managed Care, Other (non HMO)

## 2010-11-04 ENCOUNTER — Encounter: Payer: Self-pay | Admitting: Cardiology

## 2010-11-04 DIAGNOSIS — I4891 Unspecified atrial fibrillation: Secondary | ICD-10-CM

## 2010-11-04 DIAGNOSIS — Z7901 Long term (current) use of anticoagulants: Secondary | ICD-10-CM

## 2010-11-04 LAB — CONVERTED CEMR LAB: POC INR: 2.3

## 2010-11-05 ENCOUNTER — Telehealth (INDEPENDENT_AMBULATORY_CARE_PROVIDER_SITE_OTHER): Payer: Self-pay | Admitting: *Deleted

## 2010-11-08 NOTE — Op Note (Signed)
  [name], [name]              ACCOUNT NO.:  [id number]  MEDICAL RECORD NO.:  [id number]           PATIENT TYPE:  O  LOCATION:  [facility]                         FACILITY:  [facility]  PHYSICIAN:  Duke Salvia, MD, FACCDATE OF BIRTH:  1956/09/11  DATE OF PROCEDURE:  10/28/2010 DATE OF DISCHARGE:  10/28/2010                              OPERATIVE REPORT   PREOPERATIVE DIAGNOSIS:  Atrial fibrillation.  POSTOPERATIVE DIAGNOSIS:  Sinus rhythm.  PROCEDURE:  The patient was submitted to Anesthesia, receiving propofol under the care of Dr. Michelle Piper.  A 120-joule shock was delivered synchronously in atrial fibrillation restoring sinus rhythm.  The patient had bradycardia initially rates in the 40s, now in the mid 50s. The patient will be discharged on his current medications apart from the digoxin.     Duke Salvia, MD, [facility]     SCK/MEDQ  D:  10/28/2010  T:  10/29/2010  Job:  [id number]  Electronically Signed by Sherryl Manges MD [facility] on 11/08/2010 10:00:35 PM

## 2010-11-10 NOTE — Medication Information (Signed)
Summary: rov/sp  Anticoagulant Therapy  Managed by: Cloyde Reams, RN, BSN Referring MD: Berton Mount, MD Supervising MD: Myrtis Ser MD, Tinnie Gens Indication 1: Atrial Fibrillation Indication 2: Cerebrovascular Accident Lab Used: [facility] [place] Site: [address] INR POC 2.3 INR RANGE 2.0-3.0  Dietary changes: no    Health status changes: no    Bleeding/hemorrhagic complications: no    Recent/future hospitalizations: no    Any changes in medication regimen? yes       Details: d/c Digoxin and Losartan.  Recent/future dental: no  Any missed doses?: no       Is patient compliant with meds? yes       Allergies: 1)  ! Lisinopril 2)  ! Simvastatin  Anticoagulation Management History:      The patient is taking warfarin and comes in today for a routine follow up visit.  Negative risk factors for bleeding include an age less than 20 years old.  The bleeding index is 'low risk'.  Negative CHADS2 values include Age > 88 years old.  His last INR was 2.6 ratio.  Anticoagulation responsible provider: Myrtis Ser MD, Tinnie Gens.  INR POC: 2.3.  Cuvette Lot#: [id number].  Exp: 09/2011.    Anticoagulation Management Assessment/Plan:      The patient's current anticoagulation dose is Warfarin sodium 5 mg tabs: Use as directed by Anticoagulation Clinic.  The target INR is 2.0-3.0.  The next INR is due 11/23/2010.  Anticoagulation instructions were given to patient.  Results were reviewed/authorized by Cloyde Reams, RN, BSN.  He was notified by Cloyde Reams RN.         Prior Anticoagulation Instructions: INR: 2.4  Increase coumadin dose to 1 tablet (5mg ) everyday except 1 1/2 tablets (7.5mg ) on Sundays and Thursdays.  Current Anticoagulation Instructions: INR 2.3  Continue on same dosage 1 tablet daily except 1.5 tablets on Sundays and Thursdays.  Recheck in 3 weeks.

## 2010-11-10 NOTE — Progress Notes (Signed)
Summary: problem with med wants to change**SK**  Phone Note Call from Patient   Caller: Patient [phone] or [phone] Summary of Call: losartin is making  pt dizzy can her change to something else? uses [facility] Initial call taken by: Glynda Jaeger,  November 01, 2010 10:21 AM  Follow-up for Phone Call        I spoke with the pt and he was started on Losartan 10/06/10.  The pt noticed about a week after starting this medication that he has dizziness on a daily basis.  The pt [facility] and it was 138/82.  The pt said he had spoken with Dr Graciela Husbands about dizziness at [facility] and his digoxin was stopped.  The pt has not noticed any improvement in his dizziness since stopping digoxin.  The pt would like to know if Dr Graciela Husbands can prescribe an alternative medication in the place of Losartan. Dr Graciela Husbands will be in the office on 11/02/10.  Follow-up by: Julieta Gutting, RN, BSN,  November 01, 2010 11:30 AM  Additional Follow-up for Phone Call Additional follow up Details #1::        Pt calling back about medications Judie Grieve  November 02, 2010 1:45 PM    Additional Follow-up for Phone Call Additional follow up Details #2::    PER DR Graciela Husbands STOP LOSARTAN  AND MONITOR B/P AS WELL AS GIVE OFFICE CALL NEXT WEEK WITH UPDATE AND B/P READINGS VERBALIZED UNDERSTANDING. ALSO WANT REFILLS ON GEN PEPCID  WILL OKAY 1 REFILL AND WILL DISCUSS WITH DR Graciela Husbands IF WILLING TO GIVE FUTURE REFILLS. Follow-up by: Scherrie Bateman, LPN,  November 02, 2010 5:11 PM  Prescriptions: PEPCID 20 MG TABS (FAMOTIDINE) Take 1 tablet by mouth two times a day  #60 x 1   Entered by:   Scherrie Bateman, LPN   Authorized by:   Nathen May, MD, [facility]   Signed by:   Scherrie Bateman, LPN on 19/14/7829   Method used:   [facility] to        [phone][facility] (retail)       [address]       [place], [place]  [address]       Ph: [phone]       Fax:  [phone]   RxID:   [id number]

## 2010-11-10 NOTE — Progress Notes (Signed)
Summary: refill/ pt out medication  Phone Note Refill Request Message from:  Patient on November 02, 2010 1:45 PM  Refills Requested: Medication #1:  PEPCID 20 MG TABS Take 1 tablet by mouth two times a day [facility]  Initial call taken by: Judie Grieve,  November 02, 2010 1:46 PM  Follow-up for Phone Call       Follow-up by: Judithe Modest CMA,  November 02, 2010 4:16 PM    Prescriptions: PEPCID 20 MG TABS (FAMOTIDINE) Take 1 tablet by mouth two times a day  #60 x 1   Entered by:   Judithe Modest CMA   Authorized by:   Nathen May, MD, [facility]   Signed by:   Judithe Modest CMA on 11/02/2010   Method used:   [facility] to        [facility] [phone][facility] (retail)       [address]       [place], [place]  [address]       Ph: [phone]       Fax: [phone]   RxID:   [id number]

## 2010-11-16 NOTE — Progress Notes (Signed)
  Phone Note Outgoing Call   Call placed by: Scherrie Bateman, LPN,  November 05, 2010 1:49 PM Call placed to: Patient Summary of Call: SPOKE WITH PT. FLAG NOTED THAT PT NEEDS DEFIB IMPLANT  BETWEEN 3/8-10/12 HAS F/U APPT WITH DR Graciela Husbands AS WELL AS ECHO ON 11/23/10  PT WAS UNDER THE UNDRESTANDING WILL DECIDE ON PLAN OF CARE AFTER ECHO DONE ,WILL DISCUSS WITH DR Graciela Husbands ON MON AND CALL PT BACK. Initial call taken by: Scherrie Bateman, LPN,  November 05, 2010 1:51 PM  Follow-up for Phone Call        Phone call completed PT SCHEDULED FOR ICD IMPLANT ON 11/25/10 AT 7:30. PT AWARE. Follow-up by: Scherrie Bateman, LPN,  November 09, 2010 8:46 AM

## 2010-11-23 ENCOUNTER — Ambulatory Visit (HOSPITAL_COMMUNITY): Payer: Managed Care, Other (non HMO) | Attending: Internal Medicine

## 2010-11-23 ENCOUNTER — Encounter (INDEPENDENT_AMBULATORY_CARE_PROVIDER_SITE_OTHER): Payer: Managed Care, Other (non HMO) | Admitting: Internal Medicine

## 2010-11-23 ENCOUNTER — Encounter: Payer: Self-pay | Admitting: Cardiology

## 2010-11-23 ENCOUNTER — Encounter (INDEPENDENT_AMBULATORY_CARE_PROVIDER_SITE_OTHER): Payer: Managed Care, Other (non HMO)

## 2010-11-23 ENCOUNTER — Encounter: Payer: Self-pay | Admitting: Internal Medicine

## 2010-11-23 DIAGNOSIS — Z7901 Long term (current) use of anticoagulants: Secondary | ICD-10-CM

## 2010-11-23 DIAGNOSIS — Z8673 Personal history of transient ischemic attack (TIA), and cerebral infarction without residual deficits: Secondary | ICD-10-CM | POA: Insufficient documentation

## 2010-11-23 DIAGNOSIS — I428 Other cardiomyopathies: Secondary | ICD-10-CM

## 2010-11-23 DIAGNOSIS — I2510 Atherosclerotic heart disease of native coronary artery without angina pectoris: Secondary | ICD-10-CM | POA: Insufficient documentation

## 2010-11-23 DIAGNOSIS — I6789 Other cerebrovascular disease: Secondary | ICD-10-CM

## 2010-11-23 DIAGNOSIS — I429 Cardiomyopathy, unspecified: Secondary | ICD-10-CM | POA: Insufficient documentation

## 2010-11-23 DIAGNOSIS — I4891 Unspecified atrial fibrillation: Secondary | ICD-10-CM | POA: Insufficient documentation

## 2010-11-23 DIAGNOSIS — I059 Rheumatic mitral valve disease, unspecified: Secondary | ICD-10-CM

## 2010-11-23 DIAGNOSIS — I5022 Chronic systolic (congestive) heart failure: Secondary | ICD-10-CM

## 2010-11-25 ENCOUNTER — Inpatient Hospital Stay: Admit: 2010-11-25 | Payer: Self-pay

## 2010-11-25 ENCOUNTER — Ambulatory Visit (HOSPITAL_COMMUNITY)
Admission: RE | Admit: 2010-11-25 | Payer: Managed Care, Other (non HMO) | Source: Ambulatory Visit | Admitting: Internal Medicine

## 2010-11-30 LAB — URINALYSIS, ROUTINE W REFLEX MICROSCOPIC
Bilirubin Urine: NEGATIVE
Ketones, ur: NEGATIVE mg/dL
Protein, ur: NEGATIVE mg/dL
Urobilinogen, UA: 1 mg/dL (ref 0.0–1.0)

## 2010-11-30 LAB — GLUCOSE, CAPILLARY
Comment 2: 99999
Glucose-Capillary: 109 mg/dL — ABNORMAL HIGH (ref 70–99)
Glucose-Capillary: 152 mg/dL — ABNORMAL HIGH (ref 70–99)
Glucose-Capillary: 65 mg/dL — ABNORMAL LOW (ref 70–99)
Glucose-Capillary: 78 mg/dL (ref 70–99)
Glucose-Capillary: 83 mg/dL (ref 70–99)
Glucose-Capillary: 84 mg/dL (ref 70–99)
Glucose-Capillary: 87 mg/dL (ref 70–99)
Glucose-Capillary: 93 mg/dL (ref 70–99)
Glucose-Capillary: 96 mg/dL (ref 70–99)

## 2010-11-30 LAB — BASIC METABOLIC PANEL
BUN: 9 mg/dL (ref 6–23)
CO2: 22 mEq/L (ref 19–32)
CO2: 26 mEq/L (ref 19–32)
CO2: 28 mEq/L (ref 19–32)
Chloride: 104 mEq/L (ref 96–112)
Chloride: 107 mEq/L (ref 96–112)
Chloride: 115 mEq/L — ABNORMAL HIGH (ref 96–112)
Creatinine, Ser: 1.06 mg/dL (ref 0.4–1.5)
Creatinine, Ser: 1.07 mg/dL (ref 0.4–1.5)
GFR calc Af Amer: >60 mL/min (ref >60)
GFR calc Af Amer: >60 mL/min (ref >60)
Glucose, Bld: 104 mg/dL — ABNORMAL HIGH (ref 70–99)
Glucose, Bld: 85 mg/dL (ref 70–99)
Potassium: 3.7 mEq/L (ref 3.5–5.1)
Sodium: 138 mEq/L (ref 135–145)

## 2010-11-30 LAB — BLOOD GAS, ARTERIAL
Bicarbonate: 22.1 mEq/L (ref 20.0–24.0)
Drawn by: 249101
FIO2: 1 %
PEEP: 5 cmH2O
RATE: 14 resp/min
pCO2 arterial: 36.3 mmHg (ref 35.0–45.0)
pO2, Arterial: 372 mmHg — ABNORMAL HIGH (ref 80.0–100.0)

## 2010-11-30 LAB — CBC
HCT: 42.3 % (ref 39.0–52.0)
MCH: 32.2 pg (ref 26.0–34.0)
MCHC: 35.3 g/dL (ref 30.0–36.0)
MCV: 91.2 fL (ref 78.0–100.0)
MCV: 91.2 fL (ref 78.0–100.0)
Platelets: 152 10*3/uL (ref 150–400)
Platelets: 213 10*3/uL (ref 150–400)
RBC: 4.07 MIL/uL — ABNORMAL LOW (ref 4.22–5.81)
RBC: 4.64 MIL/uL (ref 4.22–5.81)
RDW: 13.7 % (ref 11.5–15.5)
WBC: 5.4 10*3/uL (ref 4.0–10.5)

## 2010-11-30 LAB — CARDIAC PANEL(CRET KIN+CKTOT+MB+TROPI)
CK, MB: 2.9 ng/mL (ref 0.3–4.0)
Relative Index: 2.7 — ABNORMAL HIGH (ref 0.0–2.5)
Relative Index: INVALID (ref 0.0–2.5)
Total CK: 108 U/L (ref 7–232)
Total CK: 98 U/L (ref 7–232)
Troponin I: 0.04 ng/mL (ref 0.00–0.06)
Troponin I: 0.08 ng/mL — ABNORMAL HIGH (ref 0.00–0.06)

## 2010-11-30 LAB — PROTIME-INR
INR: 1.29 (ref 0.00–1.49)
INR: 1.35 (ref 0.00–1.49)
INR: 1.59 — ABNORMAL HIGH (ref 0.00–1.49)
INR: 2.16 — ABNORMAL HIGH (ref 0.00–1.49)
Prothrombin Time: 16.9 seconds — ABNORMAL HIGH (ref 11.6–15.2)
Prothrombin Time: 17.6 seconds — ABNORMAL HIGH (ref 11.6–15.2)
Prothrombin Time: 24.2 seconds — ABNORMAL HIGH (ref 11.6–15.2)

## 2010-11-30 LAB — COMPREHENSIVE METABOLIC PANEL
AST: 59 U/L — ABNORMAL HIGH (ref 0–37)
Albumin: 3.1 g/dL — ABNORMAL LOW (ref 3.5–5.2)
BUN: 13 mg/dL (ref 6–23)
Creatinine, Ser: 1.4 mg/dL (ref 0.4–1.5)
GFR calc Af Amer: >60 mL/min (ref >60)
Total Protein: 5.8 g/dL — ABNORMAL LOW (ref 6.0–8.3)

## 2010-11-30 LAB — CULTURE, RESPIRATORY W GRAM STAIN

## 2010-11-30 LAB — CK TOTAL AND CKMB (NOT AT ARMC)
CK, MB: 3.8 ng/mL (ref 0.3–4.0)
Relative Index: 2.7 — ABNORMAL HIGH (ref 0.0–2.5)

## 2010-11-30 LAB — DIFFERENTIAL
Eosinophils Relative: 1 % (ref 0–5)
Lymphocytes Relative: 21 % (ref 12–46)
Lymphs Abs: 1.2 10*3/uL (ref 0.7–4.0)
Monocytes Absolute: 0.6 10*3/uL (ref 0.1–1.0)
Monocytes Relative: 11 % (ref 3–12)
Neutro Abs: 3.6 10*3/uL (ref 1.7–7.7)

## 2010-11-30 LAB — APTT: aPTT: 24 seconds (ref 24–37)

## 2010-11-30 LAB — LIPID PANEL
LDL Cholesterol: 103 mg/dL — ABNORMAL HIGH (ref 0–99)
Triglycerides: 68 mg/dL (ref <150)

## 2010-11-30 LAB — URINE MICROSCOPIC-ADD ON

## 2010-11-30 NOTE — Letter (Signed)
Summary: Return To Work  [employer], Main Office  [address]   [place], [place] [address]   Phone: [phone]  Fax: [phone]    11/23/2010  TO: WHOM IT MAY CONCERN   RE: Tyler Sims  [address] [id number]   The above named individual is under my medical care and may return to work on:12/06/10 PART TIME BASIS WITH WEIGHT RESTRICTION OF NO MORE THAN 25 POUNDS.  If you have any further questions or need additional information, please call.     Sincerely,   DR Brett Canales KLEIN/ Scherrie Bateman, LPN

## 2010-11-30 NOTE — Assessment & Plan Note (Signed)
Summary: eph. echo same day . per sk.gd   Visit Type:  Follow-up Referring Provider:  Sherryl Manges  CC:  no cardiac omplaints  had echo done this earlier today .  History of Present Illness:  Tyler Sims is seen in followup for atrial fibrillation and cardiomyopathy.   He presented in December with a  acute left MCA distribution CVA treated emergently with intra-arterial tPA and thrmobectomy.  an echo demonstrated reduced LVF with an EF 15-20%.  He was presumed to have a nonischemic cardiomyopathy.  The CVA was thought to be embolic and he was placed on coumadin.  He was noted to have NSVT and was placed on a lifevest.   He wassubmitted for Myoview scanning to eliminate coronary disease as a contributor to his myopathy. .  His myoview is markedly abnormal.  His EF is better on nuclear study at 40%.  However, he has evidence for inferobasal infarct with anteroapical ischemia; these findings weresuggestive of multivessel CAD. He wasreferred for catheterization demonstrating nonobstructive coronary disease ejection fraction had improved to 30-35% range. Have moderate-severe mitral regurgitation.  He was finally cardioverted from his atrial fibrillation. He comes in today in followup for ultrasound to see if his ejection fraction has improved now 3 months following his initial presentation    Current Medications (verified): 1)  Warfarin Sodium 5 Mg Tabs (Warfarin Sodium) .... Use As Directed By Anticoagulation Clinic 2)  Metoprolol Succinate 50 Mg Xr24h-Tab (Metoprolol Succinate) .... 1/2 Tab Once Daily 3)  Pepcid 20 Mg Tabs (Famotidine) .... Take 1 Tablet By Mouth Two Times A Day  Allergies: 1)  ! Lisinopril 2)  ! Simvastatin  Vital Signs:  Patient profile:   55 year old male Height:      74 inches Weight:      198.75 pounds BMI:     25.61 Pulse rate:   59 / minute Pulse rhythm:   regular BP sitting:   145 / 86  (left arm) Cuff size:   regular  Vitals Entered By: Scherrie Bateman, LPN (November 22, 9560 1:35 PM)  Physical Exam  General:  The patient was alert and oriented in no acute distress. HEENT Normal.  Neck veins were flat, carotids were brisk.  Lungs were clear.  Heart sounds were regular with 2/6 systolic m Abdomen was soft with active bowel sounds. There is no clubbing cyanosis or edema. Skin Warm and dry    Impression & Recommendations:  Problem # 1:  ATRIAL FIBRILLATION (ICD-427.31) holding  sinus rhythm His updated medication list for this problem includes:    Warfarin Sodium 5 Mg Tabs (Warfarin sodium) ..... Use as directed by anticoagulation clinic    Metoprolol Succinate 50 Mg Xr24h-tab (Metoprolol succinate) .Marland Kitchen... 1/2 tab once daily  Problem # 2:  CARDIOMYOPATHY, SECONDARY (ICD-425.9) persistent left ventricular dysfunction his echo has been preliminarily read with an EF of 30- 35%;  we will try to resume ARB therapy and thereafter Aldactone therapy. He would like to avoid ICD therapy. We discussed at length today. The issue of inappropriate shocks was quite daunting.  He understands that it is a reasonable recommendation after 3 months to proceed with ICD implantation; he says I am rolling dice.  Also note that his life vest alarmed  the other day when he is walking almost certainly this is sinus as he had no associated symptoms His updated medication list for this problem includes:    Warfarin Sodium 5 Mg Tabs (Warfarin sodium) ..... Use as directed  by anticoagulation clinic    Metoprolol Succinate 50 Mg Xr24h-tab (Metoprolol succinate) .Marland Kitchen... 1/2 tab once daily    Losartan Potassium 25 Mg Tabs (Losartan potassium) .Marland Kitchen... 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 Use as directed by anticoagulation clinic    Metoprolol Succinate 50 Mg Xr24h-tab (Metoprolol succinate) .Marland Kitchen... 1/2 tab once  daily  Patient Instructions: 1)  Your physician recommends that you schedule a follow-up appointment in: 3 MONTHS WITH DR Graciela Husbands 2)  Your physician has recommended you make the following change in your medication:  ADD LOSARTAN 25 MG 1 once daily

## 2010-11-30 NOTE — Medication Information (Signed)
Summary: rov/ewj  Anticoagulant Therapy  Managed by: Bethena Midget, RN, BSN Referring MD: Berton Mount, MD Supervising MD: Antoine Poche MD, Fayrene Fearing Indication 1: Atrial Fibrillation Indication 2: Cerebrovascular Accident Lab Used: [facility] [place] Site: [address] INR POC 1.8 INR RANGE 2.0-3.0  Dietary changes: no    Health status changes: no    Bleeding/hemorrhagic complications: no    Recent/future hospitalizations: no    Any changes in medication regimen? no    Recent/future dental: no  Any missed doses?: no       Is patient compliant with meds? yes       Allergies: 1)  ! Lisinopril 2)  ! Simvastatin  Anticoagulation Management History:      The patient is taking warfarin and comes in today for a routine follow up visit.  Negative risk factors for bleeding include an age less than 24 years old.  The bleeding index is 'low risk'.  Negative CHADS2 values include Age > 83 years old.  His last INR was 2.6 ratio.  Anticoagulation responsible provider: Antoine Poche MD, Fayrene Fearing.  INR POC: 1.8.  Cuvette Lot#: [id number].  Exp: 09/2011.    Anticoagulation Management Assessment/Plan:      The patient's current anticoagulation dose is Warfarin sodium 5 mg tabs: Use as directed by Anticoagulation Clinic.  The target INR is 2.0-3.0.  The next INR is due 12/07/2010.  Anticoagulation instructions were given to patient.  Results were reviewed/authorized by Bethena Midget, RN, BSN.  He was notified by Bethena Midget, RN, BSN.         Prior Anticoagulation Instructions: INR 2.3  Continue on same dosage 1 tablet daily except 1.5 tablets on Sundays and Thursdays.  Recheck in 3 weeks.    Current Anticoagulation Instructions: INR 1.8 Today take 1.5 pills then change dose to 1 pill everyday except 1.5 pills on Tuesdays, Thursdays and Sundays. Recheck in 2 weeks.

## 2010-12-07 ENCOUNTER — Ambulatory Visit (INDEPENDENT_AMBULATORY_CARE_PROVIDER_SITE_OTHER): Payer: Managed Care, Other (non HMO) | Admitting: *Deleted

## 2010-12-07 DIAGNOSIS — I4891 Unspecified atrial fibrillation: Secondary | ICD-10-CM

## 2010-12-07 DIAGNOSIS — Z7901 Long term (current) use of anticoagulants: Secondary | ICD-10-CM

## 2010-12-07 DIAGNOSIS — I639 Cerebral infarction, unspecified: Secondary | ICD-10-CM

## 2010-12-07 DIAGNOSIS — I6350 Cerebral infarction due to unspecified occlusion or stenosis of unspecified cerebral artery: Secondary | ICD-10-CM

## 2010-12-07 NOTE — Patient Instructions (Signed)
Continue on same dosage 1 tablet daily except 1.5 tablets on Sundays, Tuesdays, and Thursdays.  Recheck in 3 weeks.

## 2010-12-28 ENCOUNTER — Ambulatory Visit (INDEPENDENT_AMBULATORY_CARE_PROVIDER_SITE_OTHER): Payer: Managed Care, Other (non HMO) | Admitting: *Deleted

## 2010-12-28 ENCOUNTER — Other Ambulatory Visit: Payer: Self-pay | Admitting: Cardiovascular Disease

## 2010-12-28 DIAGNOSIS — I639 Cerebral infarction, unspecified: Secondary | ICD-10-CM

## 2010-12-28 DIAGNOSIS — Z7901 Long term (current) use of anticoagulants: Secondary | ICD-10-CM

## 2010-12-28 DIAGNOSIS — I6350 Cerebral infarction due to unspecified occlusion or stenosis of unspecified cerebral artery: Secondary | ICD-10-CM

## 2010-12-28 DIAGNOSIS — I4891 Unspecified atrial fibrillation: Secondary | ICD-10-CM

## 2010-12-28 NOTE — Patient Instructions (Signed)
INR2.2 Continue on same dosage 1 tablet(5mg ) daily,  except 1.5 tablets on Sundays, Tuesdays, and Thursdays.  Recheck in 4 weeks.

## 2011-01-25 ENCOUNTER — Ambulatory Visit (INDEPENDENT_AMBULATORY_CARE_PROVIDER_SITE_OTHER): Payer: Managed Care, Other (non HMO) | Admitting: *Deleted

## 2011-01-25 DIAGNOSIS — I4891 Unspecified atrial fibrillation: Secondary | ICD-10-CM

## 2011-01-25 DIAGNOSIS — I639 Cerebral infarction, unspecified: Secondary | ICD-10-CM

## 2011-01-25 DIAGNOSIS — Z7901 Long term (current) use of anticoagulants: Secondary | ICD-10-CM

## 2011-01-25 DIAGNOSIS — I6350 Cerebral infarction due to unspecified occlusion or stenosis of unspecified cerebral artery: Secondary | ICD-10-CM

## 2011-01-25 LAB — POCT INR: INR: 1.7

## 2011-02-10 ENCOUNTER — Encounter: Payer: Self-pay | Admitting: Internal Medicine

## 2011-02-16 ENCOUNTER — Ambulatory Visit (INDEPENDENT_AMBULATORY_CARE_PROVIDER_SITE_OTHER): Payer: Managed Care, Other (non HMO) | Admitting: *Deleted

## 2011-02-16 DIAGNOSIS — I4891 Unspecified atrial fibrillation: Secondary | ICD-10-CM

## 2011-02-16 DIAGNOSIS — Z7901 Long term (current) use of anticoagulants: Secondary | ICD-10-CM

## 2011-02-16 DIAGNOSIS — I639 Cerebral infarction, unspecified: Secondary | ICD-10-CM

## 2011-02-16 DIAGNOSIS — I6350 Cerebral infarction due to unspecified occlusion or stenosis of unspecified cerebral artery: Secondary | ICD-10-CM

## 2011-02-18 ENCOUNTER — Telehealth: Payer: Self-pay | Admitting: Internal Medicine

## 2011-02-18 NOTE — Telephone Encounter (Signed)
I spoke with Britta Mccreedy at [place]. She states the patient wore his Lifevest from 08/31/10-11/23/10. They have done a second level appeal which needs a letter of medical necessity from Dr. Graciela Husbands stating the diagnosis, plan of treatment while wearing the vest, and why was it discontinued. They would also like the NYHA class for the patient documented in the letter. I explained I will review with Dr. Graciela Husbands. The patient is also scheduled to come in Monday for an appointment and they would like the note sent to them as well to help support getting payment for the Lifevest.  The fax # is [phone] & the phone # is [phone] ext [phone] for [facility]. I explained I should be able to do this for her next week.

## 2011-02-18 NOTE — Telephone Encounter (Signed)
A request for medical necessity - life vest, the reason for discharge from the vest.

## 2011-02-21 ENCOUNTER — Encounter: Payer: Self-pay | Admitting: Internal Medicine

## 2011-02-21 ENCOUNTER — Ambulatory Visit (INDEPENDENT_AMBULATORY_CARE_PROVIDER_SITE_OTHER): Payer: Managed Care, Other (non HMO) | Admitting: Internal Medicine

## 2011-02-21 DIAGNOSIS — I5040 Unspecified combined systolic (congestive) and diastolic (congestive) heart failure: Secondary | ICD-10-CM

## 2011-02-21 DIAGNOSIS — I5022 Chronic systolic (congestive) heart failure: Secondary | ICD-10-CM

## 2011-02-21 DIAGNOSIS — I472 Ventricular tachycardia, unspecified: Secondary | ICD-10-CM

## 2011-02-21 MED ORDER — SPIRONOLACTONE 25 MG PO TABS: ORAL_TABLET | ORAL | Status: DC

## 2011-02-21 NOTE — Assessment & Plan Note (Signed)
No known recurrent ventricular arrhythmias

## 2011-02-21 NOTE — Assessment & Plan Note (Signed)
Patient has had persistent left ventricular dysfunction. We'll add Aldactone. We reviewed side effects. Will get his metabolic profile checked in 2 weeks time. We'll plan reassessment of his left ventricular function in 3 months.

## 2011-02-21 NOTE — Progress Notes (Signed)
  HPI  Tyler Sims is a 55 y.o. male Seen in followup for nonischemic cardiomyopathy with which he presented with a stroke and atrial fibrillation. It was initially hoped that this was secondary to the rate. However, despite restoration of sinus rhythm, he has had persistent left ventricular dysfunction. After 3 months he refused to continue to wear his like this. He also declined ICD implantation at that time ejection fraction remains below 25-30%.  He has been able to take ARB therapy more recently. This adds to his beta blocker.Marland Kitchen  He is doing quite well. The patient deniesn shortness of breath, nocturnal dyspnea, orthopnea or peripheral edema.  There have been no palpitations, lightheadedness or syncope. He has had some recent atypical chest discomforts over the last week. They're not related to exertion   Past Medical History  Diagnosis Date  . Stroke   . Systolic CHF   . Persistent atrial fibrillation   . NICM (nonischemic cardiomyopathy)   . Bradycardia     nocturnal  . Ventricular tachycardia, non-sustained     life vest  . Mitral regurgitation     Past Surgical History  Procedure Date  . None     Current Outpatient Prescriptions  Medication Sig Dispense Refill  . losartan (COZAAR) 25 MG tablet Take 1 tablet by mouth daily.      . metoprolol (TOPROL-XL) 50 MG 24 hr tablet Take 0.5 tablets by mouth daily.      Marland Kitchen warfarin (COUMADIN) 5 MG tablet Take 1 tablet (5 mg total) by mouth as directed.  45 tablet  3  . famotidine (PEPCID) 20 MG tablet Take 1 tablet by mouth Twice daily.        Allergies  Allergen Reactions  . Lisinopril   . Simvastatin     Review of Systems negative except from HPI and PMH  Physical Exam Well developed and well nourished in no acute distress HENT normal E scleral and icterus clear Neck Supple JVP flat; carotids brisk and full Clear to ausculation Regular rate and rhythm, no murmurs gallops or rub Soft with active bowel sounds No  clubbing cyanosis and edema Alert and oriented, grossly normal motor and sensory function Skin Warm and Dry  ECG sinus rhythm at 62 Intervals 0.1 a 5.10 5.40 Poor R-wave progression T-Wave inversions Assessment and  Plan

## 2011-02-21 NOTE — Assessment & Plan Note (Signed)
As above.

## 2011-02-21 NOTE — Assessment & Plan Note (Signed)
No recurrent atrial fibrillation of which we are aware. We discussed alternative anticoagulants. He went this point like to continue Coumadin.

## 2011-02-21 NOTE — Patient Instructions (Signed)
Your physician has recommended you make the following change in your medication:  1) Start aldactone (spironalactone) 25mg  1/2 tablet by mouth daily.  Your physician recommends that you return for lab work in: 2 weeks- bmp (428.22;428.4).  Your physician has requested that you have an echocardiogram in 3 months. Echocardiography is a painless test that uses sound waves to create images of your heart. It provides your doctor with information about the size and shape of your heart and how well your heart's chambers and valves are working. This procedure takes approximately one hour. There are no restrictions for this procedure.  Your physician recommends that you schedule a follow-up appointment in: 3 months.

## 2011-02-25 NOTE — Telephone Encounter (Signed)
Dr. Odessa Fleming office note from most recent office visit faxed to [facility] at [phone].

## 2011-03-08 ENCOUNTER — Other Ambulatory Visit (INDEPENDENT_AMBULATORY_CARE_PROVIDER_SITE_OTHER): Payer: Managed Care, Other (non HMO) | Admitting: *Deleted

## 2011-03-08 DIAGNOSIS — I5022 Chronic systolic (congestive) heart failure: Secondary | ICD-10-CM

## 2011-03-08 DIAGNOSIS — I472 Ventricular tachycardia, unspecified: Secondary | ICD-10-CM

## 2011-03-08 DIAGNOSIS — I5040 Unspecified combined systolic (congestive) and diastolic (congestive) heart failure: Secondary | ICD-10-CM

## 2011-03-08 LAB — BASIC METABOLIC PANEL
BUN: 19 mg/dL (ref 6–23)
Chloride: 108 mEq/L (ref 96–112)
Creatinine, Ser: 1 mg/dL (ref 0.4–1.5)
Glucose, Bld: 90 mg/dL (ref 70–99)

## 2011-03-16 ENCOUNTER — Ambulatory Visit (INDEPENDENT_AMBULATORY_CARE_PROVIDER_SITE_OTHER): Payer: Managed Care, Other (non HMO) | Admitting: *Deleted

## 2011-03-16 DIAGNOSIS — Z7901 Long term (current) use of anticoagulants: Secondary | ICD-10-CM

## 2011-03-16 DIAGNOSIS — I6350 Cerebral infarction due to unspecified occlusion or stenosis of unspecified cerebral artery: Secondary | ICD-10-CM

## 2011-03-16 DIAGNOSIS — I639 Cerebral infarction, unspecified: Secondary | ICD-10-CM

## 2011-03-16 DIAGNOSIS — I4891 Unspecified atrial fibrillation: Secondary | ICD-10-CM

## 2011-03-16 LAB — POCT INR: INR: 2.6

## 2011-03-30 ENCOUNTER — Encounter: Payer: Self-pay | Admitting: *Deleted

## 2011-03-31 ENCOUNTER — Telehealth: Payer: Self-pay | Admitting: Internal Medicine

## 2011-03-31 NOTE — Telephone Encounter (Signed)
Tyler Sims states she needs heather mcghee to [employer] a letter. Tyler Sims would like to speak a nurse re pt letter. # [phone]  Ext. [phone]

## 2011-03-31 NOTE — Telephone Encounter (Signed)
BARB  NEEDED LETTER OF NECESSITY  REFAXED .DONE AS PER REQUEST .Zack Seal

## 2011-04-13 ENCOUNTER — Ambulatory Visit (INDEPENDENT_AMBULATORY_CARE_PROVIDER_SITE_OTHER): Payer: Managed Care, Other (non HMO) | Admitting: *Deleted

## 2011-04-13 DIAGNOSIS — I639 Cerebral infarction, unspecified: Secondary | ICD-10-CM

## 2011-04-13 DIAGNOSIS — I6350 Cerebral infarction due to unspecified occlusion or stenosis of unspecified cerebral artery: Secondary | ICD-10-CM

## 2011-04-13 DIAGNOSIS — I4891 Unspecified atrial fibrillation: Secondary | ICD-10-CM

## 2011-04-13 DIAGNOSIS — Z7901 Long term (current) use of anticoagulants: Secondary | ICD-10-CM

## 2011-05-11 ENCOUNTER — Ambulatory Visit (INDEPENDENT_AMBULATORY_CARE_PROVIDER_SITE_OTHER): Payer: Managed Care, Other (non HMO) | Admitting: *Deleted

## 2011-05-11 DIAGNOSIS — I6350 Cerebral infarction due to unspecified occlusion or stenosis of unspecified cerebral artery: Secondary | ICD-10-CM

## 2011-05-11 DIAGNOSIS — I639 Cerebral infarction, unspecified: Secondary | ICD-10-CM

## 2011-05-11 DIAGNOSIS — I4891 Unspecified atrial fibrillation: Secondary | ICD-10-CM

## 2011-05-11 DIAGNOSIS — Z7901 Long term (current) use of anticoagulants: Secondary | ICD-10-CM

## 2011-05-17 ENCOUNTER — Other Ambulatory Visit: Payer: Self-pay | Admitting: Internal Medicine

## 2011-06-14 ENCOUNTER — Ambulatory Visit (INDEPENDENT_AMBULATORY_CARE_PROVIDER_SITE_OTHER): Payer: Managed Care, Other (non HMO) | Admitting: *Deleted

## 2011-06-14 ENCOUNTER — Ambulatory Visit (INDEPENDENT_AMBULATORY_CARE_PROVIDER_SITE_OTHER): Payer: Managed Care, Other (non HMO) | Admitting: Internal Medicine

## 2011-06-14 ENCOUNTER — Ambulatory Visit (HOSPITAL_COMMUNITY): Payer: Managed Care, Other (non HMO) | Attending: Cardiology

## 2011-06-14 ENCOUNTER — Encounter: Payer: Self-pay | Admitting: Internal Medicine

## 2011-06-14 DIAGNOSIS — I429 Cardiomyopathy, unspecified: Secondary | ICD-10-CM

## 2011-06-14 DIAGNOSIS — I498 Other specified cardiac arrhythmias: Secondary | ICD-10-CM

## 2011-06-14 DIAGNOSIS — R000 Tachycardia, unspecified: Secondary | ICD-10-CM | POA: Insufficient documentation

## 2011-06-14 DIAGNOSIS — I5022 Chronic systolic (congestive) heart failure: Secondary | ICD-10-CM

## 2011-06-14 DIAGNOSIS — Z7901 Long term (current) use of anticoagulants: Secondary | ICD-10-CM

## 2011-06-14 DIAGNOSIS — R079 Chest pain, unspecified: Secondary | ICD-10-CM | POA: Insufficient documentation

## 2011-06-14 DIAGNOSIS — I639 Cerebral infarction, unspecified: Secondary | ICD-10-CM

## 2011-06-14 DIAGNOSIS — I428 Other cardiomyopathies: Secondary | ICD-10-CM

## 2011-06-14 DIAGNOSIS — I059 Rheumatic mitral valve disease, unspecified: Secondary | ICD-10-CM | POA: Insufficient documentation

## 2011-06-14 DIAGNOSIS — I472 Ventricular tachycardia: Secondary | ICD-10-CM

## 2011-06-14 DIAGNOSIS — I509 Heart failure, unspecified: Secondary | ICD-10-CM | POA: Insufficient documentation

## 2011-06-14 DIAGNOSIS — R001 Bradycardia, unspecified: Secondary | ICD-10-CM | POA: Insufficient documentation

## 2011-06-14 DIAGNOSIS — I6350 Cerebral infarction due to unspecified occlusion or stenosis of unspecified cerebral artery: Secondary | ICD-10-CM

## 2011-06-14 DIAGNOSIS — I379 Nonrheumatic pulmonary valve disorder, unspecified: Secondary | ICD-10-CM | POA: Insufficient documentation

## 2011-06-14 DIAGNOSIS — R7401 Elevation of levels of liver transaminase levels: Secondary | ICD-10-CM

## 2011-06-14 DIAGNOSIS — I4891 Unspecified atrial fibrillation: Secondary | ICD-10-CM

## 2011-06-14 DIAGNOSIS — I079 Rheumatic tricuspid valve disease, unspecified: Secondary | ICD-10-CM | POA: Insufficient documentation

## 2011-06-14 NOTE — Patient Instructions (Signed)
Your physician has recommended you make the following change in your medication:  1) Start Crestor 5 mg one tablet twice a week. 2) Start an OTC stomach acid reducer- such as omeprazole once daily.  Your physician recommends that you have lab work : when you see Dr. Debby Bud, to check your Liver function test.  Your physician wants you to follow-up in: 6 months with Dr. Graciela Husbands. You will receive a reminder letter in the mail two months in advance. If you don't receive a letter, please call our office to schedule the follow-up appointment.

## 2011-06-14 NOTE — Assessment & Plan Note (Addendum)
Holding sinus rhythm. We'll continue his current medications. See below. In addition, we discussed alternatives to warfarin anticoagulation. As he has had some problems with reflux-like pain, I would not use Pradaxa. He might be a candidate for and he will look into the cost of Xaralto.  In addition apixoban may be an alternative later.   In addition recent data demonstrated risk reduction in stroke and atrial fibrillation patients with statin therapy. We'll begin a low-dose statins.

## 2011-06-14 NOTE — Assessment & Plan Note (Signed)
We'll follow up LFTs following reinitiation of statin therapy

## 2011-06-14 NOTE — Assessment & Plan Note (Signed)
He has atypical chest pain. He has no obstructive coronary disease. He was previously relieved in part by PPI therapy. I suspect it is reflux. He will resume over-the-counter PPI therapy. He'll be seeing Dr. Debby Bud in the near future to establish primary care.

## 2011-06-14 NOTE — Assessment & Plan Note (Signed)
Continue current medications; repeat echo is pending

## 2011-06-14 NOTE — Progress Notes (Signed)
  HPI  Tyler Sims is a 55 y.o. male Seen in followup for nonischemic cardiomyopathy with which he presented with a stroke and atrial fibrillation. It was initially hoped that this was secondary to the rate. However, despite restoration of sinus rhythm, he has had persistent left ventricular dysfunction. After 3 months he refused to continue to wear his life Vest He also declined ICD implantation at that time ejection fraction remains below 25-30%.  He has been able to take ARB therapy more recently. This adds to his beta blocker.Marland Kitchen  He is doing quite well.   The patient deniesn shortness of breath, nocturnal dyspnea, orthopnea or peripheral edema. There have been no palpitations, lightheadedness or syncope. He has had some recent atypical chest discomforts and this is not associated with exertion. He is a catheterization demonstrating no obstructive coronary disease. The symptoms were somewhat abated by his use of a PPI previously   Past Medical History  Diagnosis Date  . Stroke   . Systolic CHF   . Persistent atrial fibrillation   . NICM (nonischemic cardiomyopathy)   . Bradycardia     nocturnal  . Ventricular tachycardia, non-sustained     life vest  . Mitral regurgitation     Past Surgical History  Procedure Date  . None     Current Outpatient Prescriptions  Medication Sig Dispense Refill  . losartan (COZAAR) 25 MG tablet Take 1 tablet by mouth daily.      . metoprolol (TOPROL-XL) 50 MG 24 hr tablet Take 0.5 tablets by mouth daily.      Marland Kitchen spironolactone (ALDACTONE) 25 MG tablet Take 1/2 tablet by mouth daily  30 tablet  6  . warfarin (COUMADIN) 5 MG tablet TAKE 1 TABLET AS DIRECTED  45 tablet  3    Allergies  Allergen Reactions  . Lisinopril   . Simvastatin     Review of Systems negative except from HPI and PMH  Physical Exam Well developed and well nourished in no acute distress HENT normal E scleral and icterus clear Neck Supple JVP flat; carotids brisk and  full Clear to ausculation Regular rate and rhythm, no murmurs gallops or rub Soft with active bowel sounds No clubbing cyanosis and edema Alert and oriented, grossly normal motor and sensory function Skin Warm and Dry  ECG sinus rhythm at 48 Assessment and  Plan

## 2011-06-14 NOTE — Assessment & Plan Note (Signed)
No significant exercise intolerance. I will presume for now chronotropic competence. If he has complaints later on treadmill testing would be in order

## 2011-07-07 ENCOUNTER — Ambulatory Visit: Payer: Managed Care, Other (non HMO) | Admitting: Internal Medicine

## 2011-07-12 ENCOUNTER — Ambulatory Visit (INDEPENDENT_AMBULATORY_CARE_PROVIDER_SITE_OTHER): Payer: Managed Care, Other (non HMO) | Admitting: *Deleted

## 2011-07-12 DIAGNOSIS — I4891 Unspecified atrial fibrillation: Secondary | ICD-10-CM

## 2011-07-12 DIAGNOSIS — Z7901 Long term (current) use of anticoagulants: Secondary | ICD-10-CM

## 2011-07-12 DIAGNOSIS — I639 Cerebral infarction, unspecified: Secondary | ICD-10-CM

## 2011-07-12 DIAGNOSIS — I6350 Cerebral infarction due to unspecified occlusion or stenosis of unspecified cerebral artery: Secondary | ICD-10-CM

## 2011-07-12 LAB — POCT INR: INR: 2.6

## 2011-08-10 ENCOUNTER — Other Ambulatory Visit: Payer: Self-pay | Admitting: Cardiovascular Disease

## 2011-08-12 ENCOUNTER — Telehealth: Payer: Self-pay | Admitting: Internal Medicine

## 2011-08-12 NOTE — Telephone Encounter (Signed)
I spoke with the patient's daughter. I advised her could try Coricidin HBP, Robitussin & Claritin without the "D". I explained he should drink fluids, but not over do it due to his cardiac condition. I advised this will only help to treat some of his symptoms, but the cold will have to run its course.

## 2011-08-12 NOTE — Telephone Encounter (Signed)
New problem Pt's daughter says he has a cold and doesn't know what meds to give him

## 2011-08-16 ENCOUNTER — Ambulatory Visit (INDEPENDENT_AMBULATORY_CARE_PROVIDER_SITE_OTHER): Payer: Managed Care, Other (non HMO) | Admitting: Internal Medicine

## 2011-08-16 ENCOUNTER — Encounter: Payer: Self-pay | Admitting: Internal Medicine

## 2011-08-16 VITALS — BP 108/68 | HR 76 | Temp 97.6°F | Wt 191.0 lb

## 2011-08-16 DIAGNOSIS — I429 Cardiomyopathy, unspecified: Secondary | ICD-10-CM

## 2011-08-16 DIAGNOSIS — R7402 Elevation of levels of lactic acid dehydrogenase (LDH): Secondary | ICD-10-CM

## 2011-08-16 DIAGNOSIS — I4891 Unspecified atrial fibrillation: Secondary | ICD-10-CM

## 2011-08-16 DIAGNOSIS — Z125 Encounter for screening for malignant neoplasm of prostate: Secondary | ICD-10-CM

## 2011-08-16 DIAGNOSIS — I639 Cerebral infarction, unspecified: Secondary | ICD-10-CM

## 2011-08-16 DIAGNOSIS — I6350 Cerebral infarction due to unspecified occlusion or stenosis of unspecified cerebral artery: Secondary | ICD-10-CM

## 2011-08-16 DIAGNOSIS — R079 Chest pain, unspecified: Secondary | ICD-10-CM

## 2011-08-16 DIAGNOSIS — Z0000 Encounter for general adult medical examination without abnormal findings: Secondary | ICD-10-CM

## 2011-08-16 NOTE — Patient Instructions (Signed)
Flu - push fluids, soups, light food. HOld the losartan until your Blood pressure is more in the normal range. Call if you have persistent low blood pressure.   Return to work - your call. No heroics  You will need to see your dentist and schedule repair. We will try to coordinate colonoscopy with any dental extraction that may be needed.

## 2011-08-16 NOTE — Progress Notes (Signed)
Subjective:    Patient ID: Tyler Sims, male    DOB: [birth year], 55 y.o.   MRN: [id number]  HPI Tyler Sims preent to establish for on-going continuity care. For most of his life he reports that he has been healthy and seldom saw a doctor. In December '11 he had the onset of right sided weakness from a CVA. He was seen at [facility], was treated by stroke protocol including TPA. He had a very good result so that he has had minimal sequellae from this event. During that hospital stay he was found to have a significant non-ischemic cardiomyopathy with an EF 15-20%!! Also, he had atrial fibrillation that responded to Direct Current Cardioversion. Subsequently he underwent nuclear stress testing raising a question of inferobasal wall motion change. Cardiac catherization revealed patent coronary arteries. 2D echo's have revealed global hypokinesis without focal wall motion abnormality. He has responded well to medical therapy with an inmprovement in EF to 35-40%. Currently he is stable from a cardiac perspective, holding sinus rhythm and a  Hemodynamically stable. From a neuro perspective he has done well without defecits. He follows with Dr. Pearlean Brownie. Tyler Sims's regimen includes full anticoagulation, blood pressure control with cardiac supportive drugs and low dose statin therapy.  He has recently had flu-like symptoms: high fever, myalgias, nausea and loss of appetite. He has not eaten for several days. He is feeling light-headed. He reports that he has not really had his normal strength since his stroke 1 year but is definitely weaker than usual. Blood pressure readings today are lower than his usual. He is afebrile at exam.  Past Medical History  Diagnosis Date  . Stroke   . Systolic CHF   . Persistent atrial fibrillation   . NICM (nonischemic cardiomyopathy)   . Bradycardia     nocturnal  . Ventricular tachycardia, non-sustained     life vest  . Mitral regurgitation   . Varicella   . Mumps    . Measles (roseola)    Past Surgical History  Procedure Date  . None    Family History  Problem Relation Age of Onset  . Alzheimer's disease Mother   . Lung cancer Brother   . Hypertension Brother   . Arrhythmia Brother   . Hyperlipidemia Brother   . Hypertension Sister   . Heart disease Father     CAD/MI  . Hypertension Father   . Mental illness Father     h/o depression requiring hospitalization  . Hyperlipidemia Sister   . Hypertension Sister   . Hypertension Brother   . Cancer Brother     lung  . Hypertension Brother   . Heart disease Brother     cardiomyopathy, a. fib  . Hypertension Brother   . Diabetes Neg Hx   . COPD Neg Hx    History   Social History  . Marital Status: Married    Spouse Name: N/A    Number of Children: 2  . Years of Education: 16   Occupational History  . Energy manager   .     Social History Main Topics  . Smoking status: Never Smoker   . Smokeless tobacco: Never Used  . Alcohol Use: No  . Drug Use: No  . Sexually Active: Yes -- Male partner(s)   Other Topics Concern  . Not on file   Social History Narrative   HSG, [place]. Married '82 - 2 dtrs '85, '91. 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 Marriage in good  health.       Review of Systems Constitutional:  Negative for fever, chills, activity change and unexpected weight change.  HEENT:  Negative for hearing loss, ear pain, congestion, neck stiffness and postnasal drip. Negative for sore throat or swallowing problems. Negative for dental complaints.   Eyes: Negative for vision loss or change in visual acuity.  Respiratory: Negative for chest tightness and wheezing. Negative for DOE.   Cardiovascular: Negative for chest pain or palpitations. No decreased exercise tolerance Gastrointestinal: No change in bowel habit. No bloating or gas. No reflux or indigestion Genitourinary: Negative for urgency, frequency, flank pain and difficulty urinating.  Musculoskeletal:  Negative for myalgias, back pain, arthralgias and gait problem.  Neurological: Negative for dizziness, tremors, weakness and headaches.  Hematological: Negative for adenopathy.  Psychiatric/Behavioral: Negative for behavioral problems and dysphoric mood.       Objective:   Physical Exam Vital signs reviewed - afebrile, low BP, normal heart rate. Gen'l: Tall and thin,well nourished well developed AA male in no acute distress  HEENT: Head: Normocephalic and atraumatic. Right Ear: External ear normal. EAC with cerumen impaction, TM not visualized. Left Ear: External ear normal.  EAC with cerumen impaction, TM not visualized. Nose: Nose normal. Mouth/Throat: Oropharynx is clear and moist. Dentition - native, in poor repair with a molar broken off at the gum line right lower, obviously diseased molar left lower. No buccal or palatal lesions. Posterior pharynx clear. Eyes: Conjunctivae and sclera clear. EOM intact. Pupils are equal, round, and reactive to light. Right eye exhibits no discharge. Left eye exhibits no discharge. Right eye with mild proptosis (chronic) and lower eyelid. Neck: Normal range of motion. Neck supple. No JVD present. No tracheal deviation present. No thyromegaly present.  Cardiovascular: Normal rate, regular rhythm, no gallop, no friction rub, no murmur heard.      Quiet precordium. 2+ radial and DP pulses . No carotid bruits Pulmonary/Chest: Effort normal. No respiratory distress or increased WOB, no wheezes, no rales. No chest wall deformity or CVAT. Abdominal: Soft. Bowel sounds are normal in all quadrants. He exhibits no distension, no tenderness, no rebound or guarding, No heptosplenomegaly  Genitourinary:  Deferred Musculoskeletal: Normal range of motion. He exhibits no edema and no tenderness.       Small and large joints without redness, synovial thickening or deformity. Full range of motion preserved about all small, median and large joints.  Lymphadenopathy:    He has  no cervical or supraclavicular adenopathy.  Neurological: He is alert and oriented to person, place, and time. CN II-XII intact. DTRs 2+ and symmetrical biceps, radial and patellar tendons. Cerebellar function normal with no tremor, rigidity, normal gait and station.  Skin: Skin is warm and dry. No rash noted. No erythema.  Psychiatric: He has a normal mood and affect. His behavior is normal. Thought content normal.   Lab Results  Component Value Date   WBC 6.3 10/21/2010   HGB 15.1 10/21/2010   HCT 43.8 10/21/2010   PLT 223.0 10/21/2010   GLUCOSE 90 03/08/2011   CHOL  Value: 158                08/28/2010   TRIG 68 08/28/2010   HDL 41 08/28/2010   LDLCALC  Value: 103        08/28/2010   ALT 39 09/02/2010   AST 37 09/02/2010   NA 141 03/08/2011   K 4.5 03/08/2011   CL 108 03/08/2011   CREATININE 1.0 03/08/2011   BUN  19 03/08/2011   CO2 28 03/08/2011   INR 2.6 07/12/2011   HGBA1C  Value: 4.7                                           08/28/2010           Assessment & Plan:  Influenza - by history he has had a bout of influenza from which he is recovering. He is hypotensive at today's exam and feels mildly symptomatic - lightheaded. Exam is clear.  Plan - hold Losartan until BP is more in normal range, SBP 120           Hydrate well - sports drinks recommended           Eat - soups, broths, ok for increased salt intake short-term           Call of persistent low blood pressure or inability to keep down food/fluids           Return to work if feeling up to it.

## 2011-08-17 DIAGNOSIS — Z0000 Encounter for general adult medical examination without abnormal findings: Secondary | ICD-10-CM | POA: Insufficient documentation

## 2011-08-17 NOTE — Assessment & Plan Note (Signed)
In sinus rhythm on physical exam today.   Plan - continue present medications

## 2011-08-17 NOTE — Assessment & Plan Note (Signed)
Patient with persistent cardiomyopathy with some weakness but [place] Class II.  Plan- continue present medications          Follow-up with Dr. Graciela Husbands as instructed: last visit Sept 25, '12, next visit in Dec.

## 2011-08-17 NOTE — Assessment & Plan Note (Signed)
Patient has had at rest epigastric/substernal dicomfort - relieved with PPI therapy

## 2011-08-17 NOTE — Assessment & Plan Note (Signed)
Tyler Sims is enthusiastically in favor of good health maintenance: he will be scheduling a colonoscopy after the 1st of the year. Will time this with dental repair to reduce risk of blood born infection that may affect the heart. Discussed prostate cancer screening including USPHTF recommendations. He will have PSA done. He will develop and and continue an exercise program - currently walking 2 miles several times a week. Will bring his immunizations up to date: Tdap, shingles vaccine and pneumonia vaccine.  In summary - a very nice man who has had a close brush with serious cardio-vascular disease who appears to being doing well except for current symptoms of flu. He is asked to contact the office when he is ready to schedule colonoscopy and dental repair

## 2011-08-17 NOTE — Assessment & Plan Note (Signed)
Patient had transient elevation LFTs, last lab Dec 15, '11 were normal  Pan - follow up lab per cardiology

## 2011-08-17 NOTE — Assessment & Plan Note (Signed)
Mr. Cubit has had a great result and recovery from CVA with little sequellae  Plan - risk modification: BP control, lipid control           Follow-up with neurology

## 2011-08-23 ENCOUNTER — Ambulatory Visit (INDEPENDENT_AMBULATORY_CARE_PROVIDER_SITE_OTHER): Payer: Managed Care, Other (non HMO) | Admitting: *Deleted

## 2011-08-23 DIAGNOSIS — I4891 Unspecified atrial fibrillation: Secondary | ICD-10-CM

## 2011-08-23 DIAGNOSIS — I6350 Cerebral infarction due to unspecified occlusion or stenosis of unspecified cerebral artery: Secondary | ICD-10-CM

## 2011-08-23 DIAGNOSIS — Z7901 Long term (current) use of anticoagulants: Secondary | ICD-10-CM

## 2011-08-23 DIAGNOSIS — I639 Cerebral infarction, unspecified: Secondary | ICD-10-CM

## 2011-08-23 DIAGNOSIS — Z125 Encounter for screening for malignant neoplasm of prostate: Secondary | ICD-10-CM

## 2011-08-23 LAB — HEPATIC FUNCTION PANEL
AST: 32 U/L (ref 0–37)
Albumin: 3.7 g/dL (ref 3.5–5.2)

## 2011-08-23 LAB — POCT INR: INR: 4.5

## 2011-08-24 LAB — PSA: PSA: 1.82 ng/mL (ref 0.10–4.00)

## 2011-08-29 ENCOUNTER — Encounter: Payer: Self-pay | Admitting: Internal Medicine

## 2011-09-01 ENCOUNTER — Ambulatory Visit (INDEPENDENT_AMBULATORY_CARE_PROVIDER_SITE_OTHER): Payer: Managed Care, Other (non HMO) | Admitting: *Deleted

## 2011-09-01 DIAGNOSIS — I6350 Cerebral infarction due to unspecified occlusion or stenosis of unspecified cerebral artery: Secondary | ICD-10-CM

## 2011-09-01 DIAGNOSIS — I4891 Unspecified atrial fibrillation: Secondary | ICD-10-CM

## 2011-09-01 DIAGNOSIS — Z7901 Long term (current) use of anticoagulants: Secondary | ICD-10-CM

## 2011-09-01 DIAGNOSIS — I639 Cerebral infarction, unspecified: Secondary | ICD-10-CM

## 2011-09-01 LAB — POCT INR: INR: 2.4

## 2011-09-02 ENCOUNTER — Telehealth: Payer: Self-pay | Admitting: Internal Medicine

## 2011-09-02 NOTE — Telephone Encounter (Signed)
I spoke with the patient. 

## 2011-09-02 NOTE — Telephone Encounter (Signed)
FU Call: Pt returning call from our office. Please call back.  

## 2011-09-09 ENCOUNTER — Telehealth: Payer: Self-pay | Admitting: Internal Medicine

## 2011-09-28 ENCOUNTER — Telehealth: Payer: Self-pay | Admitting: Internal Medicine

## 2011-09-28 NOTE — Telephone Encounter (Signed)
N/A.  LMTC. 

## 2011-09-28 NOTE — Telephone Encounter (Signed)
New msg Pt wants to know if he should get more samples of crestor or rx please let him know

## 2011-09-28 NOTE — Telephone Encounter (Signed)
Samples of Crestor left at the front desk per pt request.

## 2011-09-29 ENCOUNTER — Ambulatory Visit (INDEPENDENT_AMBULATORY_CARE_PROVIDER_SITE_OTHER): Payer: Managed Care, Other (non HMO) | Admitting: *Deleted

## 2011-09-29 DIAGNOSIS — I639 Cerebral infarction, unspecified: Secondary | ICD-10-CM

## 2011-09-29 DIAGNOSIS — I6350 Cerebral infarction due to unspecified occlusion or stenosis of unspecified cerebral artery: Secondary | ICD-10-CM

## 2011-09-29 DIAGNOSIS — I4891 Unspecified atrial fibrillation: Secondary | ICD-10-CM

## 2011-09-29 DIAGNOSIS — Z7901 Long term (current) use of anticoagulants: Secondary | ICD-10-CM

## 2011-10-08 ENCOUNTER — Other Ambulatory Visit: Payer: Self-pay | Admitting: Internal Medicine

## 2011-10-11 ENCOUNTER — Telehealth: Payer: Self-pay | Admitting: Internal Medicine

## 2011-10-11 NOTE — Telephone Encounter (Signed)
Rx done yesterday to [facility].

## 2011-10-11 NOTE — Telephone Encounter (Signed)
New Problem:    Patient is calling in needing a refill of his warfarin (COUMADIN) 5 MG tablet filled. Needs his medication by tomorrow he is down to his last dose.

## 2011-10-26 ENCOUNTER — Other Ambulatory Visit: Payer: Self-pay | Admitting: Internal Medicine

## 2011-10-27 ENCOUNTER — Ambulatory Visit (INDEPENDENT_AMBULATORY_CARE_PROVIDER_SITE_OTHER): Payer: Managed Care, Other (non HMO)

## 2011-10-27 DIAGNOSIS — I6350 Cerebral infarction due to unspecified occlusion or stenosis of unspecified cerebral artery: Secondary | ICD-10-CM

## 2011-10-27 DIAGNOSIS — I639 Cerebral infarction, unspecified: Secondary | ICD-10-CM

## 2011-10-27 DIAGNOSIS — Z7901 Long term (current) use of anticoagulants: Secondary | ICD-10-CM

## 2011-10-27 DIAGNOSIS — I4891 Unspecified atrial fibrillation: Secondary | ICD-10-CM

## 2011-10-27 LAB — POCT INR: INR: 3

## 2011-11-24 ENCOUNTER — Ambulatory Visit (INDEPENDENT_AMBULATORY_CARE_PROVIDER_SITE_OTHER): Payer: Managed Care, Other (non HMO) | Admitting: *Deleted

## 2011-11-24 ENCOUNTER — Ambulatory Visit (INDEPENDENT_AMBULATORY_CARE_PROVIDER_SITE_OTHER): Payer: Managed Care, Other (non HMO) | Admitting: Internal Medicine

## 2011-11-24 DIAGNOSIS — I4891 Unspecified atrial fibrillation: Secondary | ICD-10-CM

## 2011-11-24 DIAGNOSIS — I639 Cerebral infarction, unspecified: Secondary | ICD-10-CM

## 2011-11-24 DIAGNOSIS — Z7901 Long term (current) use of anticoagulants: Secondary | ICD-10-CM

## 2011-11-24 DIAGNOSIS — I429 Cardiomyopathy, unspecified: Secondary | ICD-10-CM

## 2011-11-24 DIAGNOSIS — I6350 Cerebral infarction due to unspecified occlusion or stenosis of unspecified cerebral artery: Secondary | ICD-10-CM

## 2011-11-24 LAB — POCT INR: INR: 2.5

## 2011-11-24 MED ORDER — PRAVASTATIN SODIUM 40 MG PO TABS: ORAL_TABLET | ORAL | Status: DC

## 2011-11-24 NOTE — Assessment & Plan Note (Signed)
improveing  Continue current meds

## 2011-11-24 NOTE — Assessment & Plan Note (Signed)
Theses were normal in NOV  Will resume pravastatin

## 2011-11-24 NOTE — Progress Notes (Signed)
  HPI  Tyler Sims is a 56 y.o. male  Seen in followup for nonischemic cardiomyopathy with which he presented with a stroke and atrial fibrillation. It was initially hoped that this was secondary to the rate. However, despite restoration of sinus rhythm, he has had persistent left ventricular dysfunction. After 3 months he refused to continue to wear his life Vest He also declined ICD implantation at that time ejection fraction remains below 25-30%.   Echo spet 2012  40% He has been able to take ARB therapy more recently. This adds to his beta blocker..  The patient denies chest pain, shortness of breath, nocturnal dyspnea, orthopnea or peripheral edema.  There have been no palpitations, lightheadedness or syncope.          Past Medical History  Diagnosis Date  . Stroke   . Systolic CHF   . Persistent atrial fibrillation   . NICM (nonischemic cardiomyopathy)   . Bradycardia     nocturnal  . Ventricular tachycardia, non-sustained     life vest  . Mitral regurgitation   . Varicella   . Mumps   . Measles (roseola)     Past Surgical History  Procedure Date  . None     Current Outpatient Prescriptions  Medication Sig Dispense Refill  . famotidine (PEPCID) 20 MG tablet Take 20 mg by mouth daily.      Marland Kitchen losartan (COZAAR) 25 MG tablet TAKE 1 TABLET ONCE DAILY  90 tablet  3  . metoprolol succinate (TOPROL-XL) 50 MG 24 hr tablet       . rosuvastatin (CRESTOR) 5 MG tablet Take one tablet by mouth twice a week.      . spironolactone (ALDACTONE) 25 MG tablet Take 1/2 tablet by mouth daily  30 tablet  6  . warfarin (COUMADIN) 5 MG tablet Take as directed by anticoagulation clinic  45 tablet  3    Allergies  Allergen Reactions  . Lisinopril   . Simvastatin     Review of Systems negative except from HPI and PMH  Physical Exam BP 138/80  Pulse 56  Wt 198 lb 6.4 oz (89.994 kg) Well developed and well nourished in no acute distress HENT normal E scleral and icterus  clear Neck Supple JVP flat; carotids brisk and full Clear to ausculation Regular rate and rhythm, no murmurs gallops or rub Soft with active bowel sounds No clubbing cyanosis none Edema Alert and oriented, grossly normal motor and sensory function Skin Warm and Dry   Assessment and  Plan

## 2011-11-24 NOTE — Patient Instructions (Signed)
Your physician has recommended you make the following change in your medication:  1) Stop Crestor 2) Start Pravastatin 40 mg one tablet by mouth twice a week  Your physician recommends that you return for FASTING lab work in: 2 months- lipid/liver  Your physician wants you to follow-up in: 6 months with Dr. Graciela Husbands. You will receive a reminder letter in the mail two months in advance. If you don't receive a letter, please call our office to schedule the follow-up appointment.

## 2011-11-24 NOTE — Assessment & Plan Note (Signed)
Holding sinus rhtyhm  

## 2011-11-28 ENCOUNTER — Encounter: Payer: Self-pay | Admitting: Internal Medicine

## 2011-12-22 ENCOUNTER — Ambulatory Visit (INDEPENDENT_AMBULATORY_CARE_PROVIDER_SITE_OTHER): Payer: Managed Care, Other (non HMO) | Admitting: *Deleted

## 2011-12-22 DIAGNOSIS — Z7901 Long term (current) use of anticoagulants: Secondary | ICD-10-CM

## 2011-12-22 DIAGNOSIS — I6350 Cerebral infarction due to unspecified occlusion or stenosis of unspecified cerebral artery: Secondary | ICD-10-CM

## 2011-12-22 DIAGNOSIS — I4891 Unspecified atrial fibrillation: Secondary | ICD-10-CM

## 2011-12-22 DIAGNOSIS — I639 Cerebral infarction, unspecified: Secondary | ICD-10-CM

## 2011-12-26 ENCOUNTER — Telehealth: Payer: Self-pay | Admitting: Internal Medicine

## 2011-12-26 NOTE — Telephone Encounter (Signed)
Patient called no answer.LMTC. 

## 2011-12-26 NOTE — Telephone Encounter (Signed)
New Preoblem:    Patient called in wanting to know if he can take Claritin in conjunction with his other medications.  Please call back.

## 2011-12-27 NOTE — Telephone Encounter (Signed)
I spoke with the patient and he is aware he may take plain claritin without the "D."

## 2011-12-29 ENCOUNTER — Encounter: Payer: Self-pay | Admitting: Internal Medicine

## 2012-01-24 ENCOUNTER — Ambulatory Visit (INDEPENDENT_AMBULATORY_CARE_PROVIDER_SITE_OTHER): Payer: Managed Care, Other (non HMO) | Admitting: *Deleted

## 2012-01-24 ENCOUNTER — Ambulatory Visit (INDEPENDENT_AMBULATORY_CARE_PROVIDER_SITE_OTHER): Payer: Managed Care, Other (non HMO) | Admitting: Internal Medicine

## 2012-01-24 ENCOUNTER — Telehealth: Payer: Self-pay

## 2012-01-24 ENCOUNTER — Other Ambulatory Visit (INDEPENDENT_AMBULATORY_CARE_PROVIDER_SITE_OTHER): Payer: Managed Care, Other (non HMO)

## 2012-01-24 ENCOUNTER — Encounter: Payer: Self-pay | Admitting: Internal Medicine

## 2012-01-24 VITALS — BP 130/82 | HR 66 | Ht 75.0 in | Wt 200.0 lb

## 2012-01-24 DIAGNOSIS — Z01818 Encounter for other preprocedural examination: Secondary | ICD-10-CM

## 2012-01-24 DIAGNOSIS — I4891 Unspecified atrial fibrillation: Secondary | ICD-10-CM

## 2012-01-24 DIAGNOSIS — I4821 Permanent atrial fibrillation: Secondary | ICD-10-CM

## 2012-01-24 DIAGNOSIS — Z1211 Encounter for screening for malignant neoplasm of colon: Secondary | ICD-10-CM

## 2012-01-24 DIAGNOSIS — I639 Cerebral infarction, unspecified: Secondary | ICD-10-CM

## 2012-01-24 DIAGNOSIS — Z7901 Long term (current) use of anticoagulants: Secondary | ICD-10-CM

## 2012-01-24 DIAGNOSIS — I6350 Cerebral infarction due to unspecified occlusion or stenosis of unspecified cerebral artery: Secondary | ICD-10-CM

## 2012-01-24 DIAGNOSIS — I429 Cardiomyopathy, unspecified: Secondary | ICD-10-CM

## 2012-01-24 LAB — HEPATIC FUNCTION PANEL
ALT: 20 U/L (ref 0–53)
AST: 21 U/L (ref 0–37)
Albumin: 4 g/dL (ref 3.5–5.2)

## 2012-01-24 LAB — LIPID PANEL
Cholesterol: 174 mg/dL (ref 0–200)
HDL: 67.8 mg/dL (ref >39.00)
Triglycerides: 77 mg/dL (ref 0.0–149.0)

## 2012-01-24 MED ORDER — PEG-KCL-NACL-NASULF-NA ASC-C 100 G PO SOLR: 1.0000 | Freq: Once | ORAL | Status: DC

## 2012-01-24 NOTE — Telephone Encounter (Signed)
Patient is scheduled for a screening colonoscopy for 02/07/12 with Dr Leone Payor.  Dr. Leone Payor believes the patient should have lovenox bridge.  Please advise.

## 2012-01-24 NOTE — Progress Notes (Signed)
  Subjective:    Patient ID: Tyler Sims, male    DOB: Feb 11, 1956, 56 y.o.   MRN: [id number]  HPI This is a very pleasant 56 year old African American man without any active gastrointestinal symptoms. He has received a recommendation to have a screening colonoscopy, he takes warfarin in the setting of atrial fibrillation and prior stroke.  Medications, allergies, past medical history, past surgical history, family history and social history are reviewed and updated in the EMR.  Review of Systems No significant chest pain or respiratory difficulty    Objective:   Physical Exam General:  NAD Eyes:   anicteric Lungs:  clear Heart:  S1S2 no rubs, murmurs or gallops Abdomen:  soft and nontender, BS+ Ext:   no edema      Assessment & Plan:   1. Special screening for malignant neoplasms, colon   2. Preoperative examination, unspecified   3. Warfarin anticoagulation   4. Atrial fibrillation, permanent    Screening colonoscopy is reasonable. I have reviewed the risks benefits and indications including alternatives. Since he has had a prior stroke in the setting of atrial fibrillation  Lovenox bridging while holding his warfarin makes sense to reduce the risk of repeat stroke. I have discussed this with him. We will clarify with his cardiologist Dr. Graciela Husbands but in anticipation of agreement with that will schedule for colonoscopy.  I appreciate the opportunity to care for this patient.

## 2012-01-24 NOTE — Patient Instructions (Signed)
You have been scheduled for a colonoscopy. Please follow written instructions given to you at your visit today.  Please pick up your prep kit at the pharmacy within the next 1-3 days.  You will be contaced by our office prior to your procedure for directions on holding your Coumadin/Warfarin.  If you do not hear from our office 1 week prior to your scheduled procedure, please call [phone] to discuss.

## 2012-01-30 NOTE — Telephone Encounter (Signed)
Pt with prior stroke  I would agree with bridging

## 2012-01-31 MED ORDER — ENOXAPARIN SODIUM 120 MG/0.8ML ~~LOC~~ SOLN: 120.0000 mg | Freq: Every day | SUBCUTANEOUS | Status: DC

## 2012-01-31 NOTE — Telephone Encounter (Signed)
Called pt with Lovenox instructions, made appointment to check INR on 02/10/12. Pt verbalized understanding of Lovenox instructions, pt has been on Lovenox bridge in the past and wife has administered injections.

## 2012-01-31 NOTE — Telephone Encounter (Signed)
Pt needs to hold Coumadin 5 days prior to colonoscopy on 02/07/12, last dose of Coumadin on 02/01/12.  No Lovenox or Coumadin on 02/02/12, start taking Lovenox 120mg  once daily in the am on 5/17, continue once daily Lovenox last dosage prior to procedure on 02/06/12 in the am. Resume Lovenox post procedure per MD instruction once safe to do so, restart taking 120mg  once daily.  Also resume Coumadin post procedure take 10mg  x 2 doses, then resume previous dosage 7.5mg  daily except 5mg  on MWF.

## 2012-02-05 ENCOUNTER — Other Ambulatory Visit: Payer: Self-pay | Admitting: Internal Medicine

## 2012-02-06 ENCOUNTER — Telehealth: Payer: Self-pay | Admitting: *Deleted

## 2012-02-06 NOTE — Telephone Encounter (Signed)
Called patient to see if he was willing/able to come for an earlier available appointment.  Patient instructed to arrive at 1400 for a 1500 procedure and to also start drinking his prep 1 hour earlier than he was originally instructed to.

## 2012-02-07 ENCOUNTER — Encounter: Payer: Self-pay | Admitting: Internal Medicine

## 2012-02-07 ENCOUNTER — Ambulatory Visit (AMBULATORY_SURGERY_CENTER): Payer: Managed Care, Other (non HMO) | Admitting: Internal Medicine

## 2012-02-07 ENCOUNTER — Other Ambulatory Visit: Payer: Managed Care, Other (non HMO) | Admitting: Internal Medicine

## 2012-02-07 VITALS — BP 142/76 | HR 78 | Temp 96.5°F | Resp 22 | Ht 75.0 in | Wt 200.0 lb

## 2012-02-07 DIAGNOSIS — D126 Benign neoplasm of colon, unspecified: Secondary | ICD-10-CM

## 2012-02-07 DIAGNOSIS — Z1211 Encounter for screening for malignant neoplasm of colon: Secondary | ICD-10-CM

## 2012-02-07 DIAGNOSIS — K635 Polyp of colon: Secondary | ICD-10-CM

## 2012-02-07 HISTORY — PX: COLONOSCOPY W/ BIOPSIES: SHX1374

## 2012-02-07 HISTORY — DX: Polyp of colon: K63.5

## 2012-02-07 MED ORDER — SODIUM CHLORIDE 0.9 % IV SOLN: 500.0000 mL | INTRAVENOUS | Status: DC

## 2012-02-07 NOTE — Patient Instructions (Signed)

## 2012-02-07 NOTE — Op Note (Signed)
[facility] [address] [place], [place]  [address]  COLONOSCOPY PROCEDURE REPORT  PATIENT:  Tyler Sims, Tyler Sims  MR#:  [id number] BIRTHDATE:  Mar 10, 1956, 55 yrs. old  GENDER:  male ENDOSCOPIST:  Iva Boop, MD, [facility] REF. BY:          Illene Regulus, MD PROCEDURE DATE:  02/07/2012 PROCEDURE:  Colonoscopy with biopsy and snare polypectomy ASA CLASS:  Class II INDICATIONS:  Routine Risk Screening MEDICATIONS:   These medications were titrated to patient response per physician's verbal order, Fentanyl 50 mcg IV, Versed 7 mg  DESCRIPTION OF PROCEDURE:   After the risks benefits and alternatives of the procedure were thoroughly explained, informed consent was obtained.  Digital rectal exam was performed and revealed no abnormalities and normal prostate.   The LB PCF-H180AL [id number] endoscope was introduced through the anus and advanced to the cecum, which was identified by both the appendix and ileocecal valve, without limitations.  The quality of the prep was excellent, using MoviPrep.  The instrument was then slowly withdrawn as the colon was fully examined. <<PROCEDUREIMAGES>>  FINDINGS:  Two polyps were found. 1 cm transverse polyp removed hot snare. Prophylactic clip applied due to need for anticoagulation (restarts enoxaparin). 2 mm ascending polyp removed cold biopsy. Both sent to pathology.  This was otherwise a normal examination of the colon. Includes right colon retroflexion.   Retroflexed views in the rectum revealed no abnormalities.    The time to cecum = 11:57 minutes (includes polypectom. The scope was then withdrawn in 7:48 minutes from the cecum and the procedure completed. COMPLICATIONS:  None ENDOSCOPIC IMPRESSION: 1) Two polyps removed, largest 1 cm 2) Otherwise normal examination RECOMMENDATIONS: 1) resume warfarin tonight 2) Resume enoxaparin (Lovenox) in AM 3) Follow-up in anti-coagulation clinic as planned REPEAT EXAM:  In for Colonoscopy,  pending biopsy results.  Iva Boop, MD, [facility]  CC:  Jacques Navy, MD and The Patient  n. Rosalie Doctor:   Iva Boop at 02/07/2012 03:30 PM  [name], [id number]

## 2012-02-07 NOTE — Progress Notes (Signed)
Patient did not experience any of the following events: a burn prior to discharge; a fall within the facility; wrong site/side/patient/procedure/implant event; or a hospital transfer or hospital admission upon discharge from the facility. (G8907) Patient did not have preoperative order for IV antibiotic SSI prophylaxis. (G8918)  

## 2012-02-08 ENCOUNTER — Telehealth: Payer: Self-pay | Admitting: *Deleted

## 2012-02-08 NOTE — Telephone Encounter (Signed)
No answer, left message to call if questions or concerns. 

## 2012-02-09 ENCOUNTER — Telehealth: Payer: Self-pay | Admitting: Internal Medicine

## 2012-02-09 DIAGNOSIS — K921 Melena: Secondary | ICD-10-CM

## 2012-02-09 NOTE — Telephone Encounter (Signed)
Patient called to report that he had a bloody stool today.  He had a colonoscopy on 02/07/12 and had a 1 cm and a 2mm polyp removed.  Dr. Leone Payor placed an endo clip because the patient was on Lovenox.  He reports one stool this pm that was loose with a large amount of dark blood.  He denies SOB, dizziness, CP, or other complaints.  He is at work now "I feel fine".  He reports that he did start back on Warfarin on Tuesday and Lovenox yesterday and today.  Per Dr. Leone Payor he needs to hold warfarin and lovenox until his appt with the coumadin clinic tomorrow.  Patient needs a CBC tomorrow I will place the orders and ask the coumadin clinic to draw it tomorrow at his appt.  I have spoke with Alcario Drought at the coumadin clinic and they will send him to the lab after his appt with them.  Patient is advised that if he has any additional bleeding that he will need to go to the ER for evaluation.  The patient verbalizes understanding of all the above instructions.  I will call the patient and get an update tomorrow.

## 2012-02-10 ENCOUNTER — Telehealth: Payer: Self-pay | Admitting: Internal Medicine

## 2012-02-10 ENCOUNTER — Ambulatory Visit (INDEPENDENT_AMBULATORY_CARE_PROVIDER_SITE_OTHER): Payer: Managed Care, Other (non HMO) | Admitting: *Deleted

## 2012-02-10 ENCOUNTER — Other Ambulatory Visit (INDEPENDENT_AMBULATORY_CARE_PROVIDER_SITE_OTHER): Payer: Managed Care, Other (non HMO)

## 2012-02-10 DIAGNOSIS — I4891 Unspecified atrial fibrillation: Secondary | ICD-10-CM

## 2012-02-10 DIAGNOSIS — I639 Cerebral infarction, unspecified: Secondary | ICD-10-CM

## 2012-02-10 DIAGNOSIS — K921 Melena: Secondary | ICD-10-CM

## 2012-02-10 DIAGNOSIS — Z7901 Long term (current) use of anticoagulants: Secondary | ICD-10-CM

## 2012-02-10 DIAGNOSIS — I6350 Cerebral infarction due to unspecified occlusion or stenosis of unspecified cerebral artery: Secondary | ICD-10-CM

## 2012-02-10 LAB — CBC WITH DIFFERENTIAL/PLATELET
Basophils Relative: 1.3 % (ref 0.0–3.0)
Eosinophils Absolute: 0.1 10*3/uL (ref 0.0–0.7)
HCT: 35.9 % — ABNORMAL LOW (ref 39.0–52.0)
Hemoglobin: 12.2 g/dL — ABNORMAL LOW (ref 13.0–17.0)
MCHC: 33.9 g/dL (ref 30.0–36.0)
MCV: 95.9 fl (ref 78.0–100.0)
Monocytes Absolute: 0.5 10*3/uL (ref 0.1–1.0)
Neutro Abs: 3.8 10*3/uL (ref 1.4–7.7)
RBC: 3.75 Mil/uL — ABNORMAL LOW (ref 4.22–5.81)

## 2012-02-10 NOTE — Telephone Encounter (Signed)
I spoke with the patient again this am.  He did have the one BM with small amount of blood this am.  He states he feels well.  Discussed with Mike Gip PA patient to avoid all strenuous activity this weekend, he is to call back today if he has an increase in bleeding or go to the ER if after 5.  I will call him with the results of the CBC when they are available.

## 2012-02-10 NOTE — Telephone Encounter (Signed)
Pt seen in Coumadin clinic on 5/24.  States small black stool this AM but resolving.  INR 1.4.  We have instructed him to hold Lovenox today but restart Coumadin.  He will restart Lovenox tomorrow as long as no more signs of bleeding.

## 2012-02-10 NOTE — Telephone Encounter (Signed)
Spoke with patient and gave him results of Hgb 12.2. Per Mike Gip, PA, call us for bleeding, go to ED if significant bleeding. Scheduled patient with Dr. Leone Payor on 02/16/12 at 2:30 PM as per Mike Gip, PA

## 2012-02-10 NOTE — Telephone Encounter (Signed)
See phone notes from 02/09/12 for further documentation

## 2012-02-14 ENCOUNTER — Telehealth: Payer: Self-pay | Admitting: Internal Medicine

## 2012-02-14 ENCOUNTER — Ambulatory Visit (INDEPENDENT_AMBULATORY_CARE_PROVIDER_SITE_OTHER): Payer: Managed Care, Other (non HMO) | Admitting: Pharmacist

## 2012-02-14 DIAGNOSIS — Z7901 Long term (current) use of anticoagulants: Secondary | ICD-10-CM

## 2012-02-14 DIAGNOSIS — I4891 Unspecified atrial fibrillation: Secondary | ICD-10-CM

## 2012-02-14 DIAGNOSIS — I6350 Cerebral infarction due to unspecified occlusion or stenosis of unspecified cerebral artery: Secondary | ICD-10-CM

## 2012-02-14 DIAGNOSIS — I639 Cerebral infarction, unspecified: Secondary | ICD-10-CM

## 2012-02-14 NOTE — Telephone Encounter (Signed)
Dr. Leone Payor do you still need to see him if the bleeding has stopped?

## 2012-02-14 NOTE — Telephone Encounter (Signed)
Not necessary.

## 2012-02-15 ENCOUNTER — Encounter: Payer: Self-pay | Admitting: Internal Medicine

## 2012-02-15 DIAGNOSIS — Z86010 Personal history of colonic polyps: Secondary | ICD-10-CM | POA: Insufficient documentation

## 2012-02-15 NOTE — Progress Notes (Signed)
Quick Note:  1 cm and 2 mm adenomas Repeat colonoscopy about 01/2015 ______

## 2012-02-16 ENCOUNTER — Ambulatory Visit: Payer: Managed Care, Other (non HMO) | Admitting: Internal Medicine

## 2012-02-21 NOTE — Progress Notes (Signed)
Quick Note:  I called him and he is still doing well without further bleeding. He can have a repeat CBC on a routine basis when he follows up with primary care at some point I don't feel compelled to recheck that now since the bleeding stopped and it was only a slightly low hemoglobin. ______

## 2012-03-05 IMAGING — CT CT HEAD W/O CM
1 series · 16 of 30 positions shown, 20 images · non-contrast
Comparison: CT 08/24/2010

CLINICAL DATA: Stroke.  Post cerebral angiogram and TPA

CT HEAD WITHOUT CONTRAST
TECHNIQUE: Contiguous axial images were obtained from the base of
the skull through the vertex without contrast.

[Series 2: (id) head 4.8 h37s st · axial · 0.50mm/px · z∈[+1171,+1331]mm · 16 of 36 slices shown, 20 images]
[im 2/36  brain]
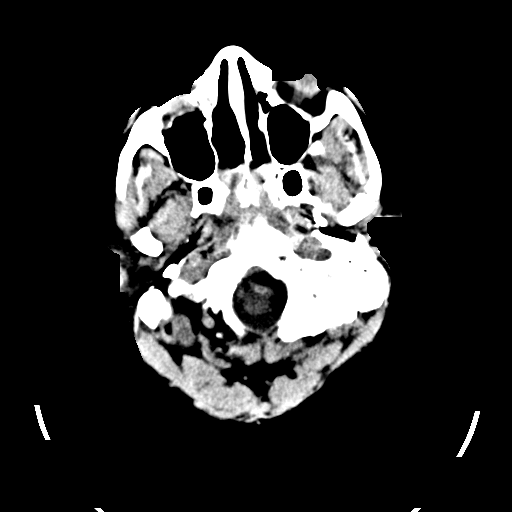
[im 2/36  bone]
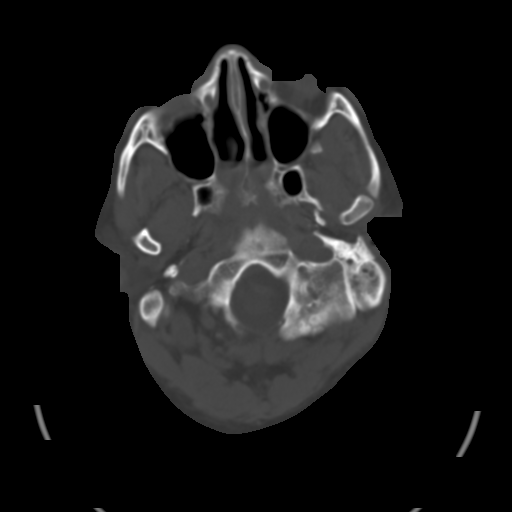
[im 4/36  brain]
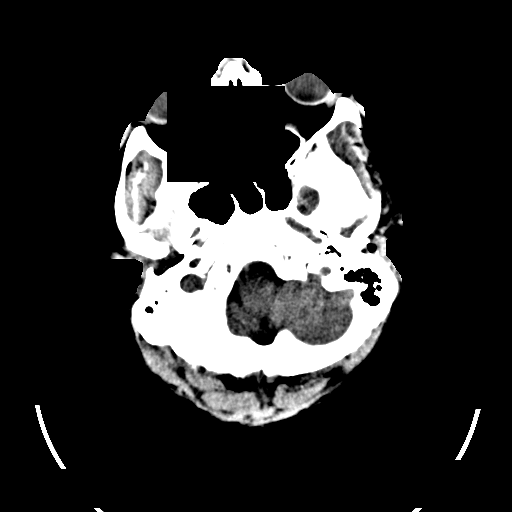
[im 7/36  brain]
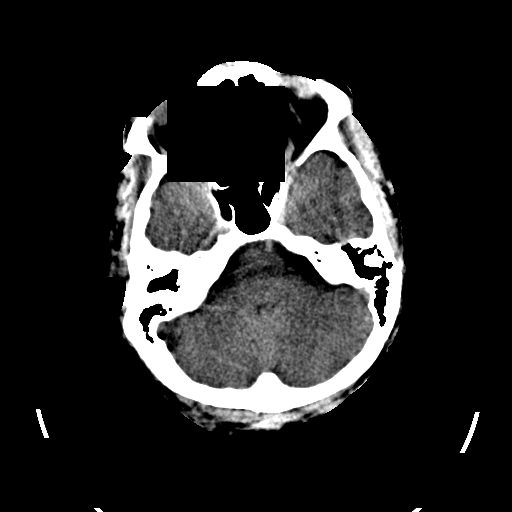
[im 9/36  brain]
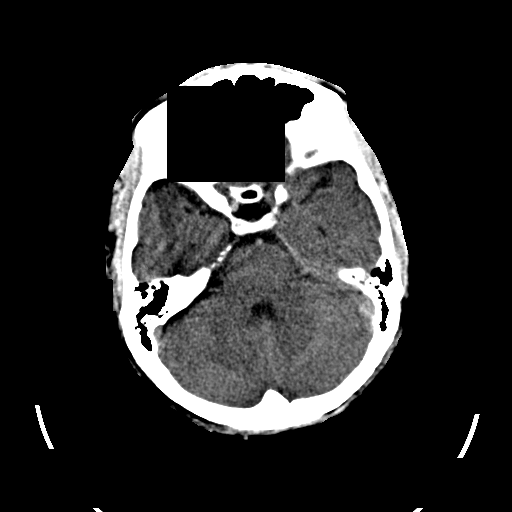
[im 10/36  brain]
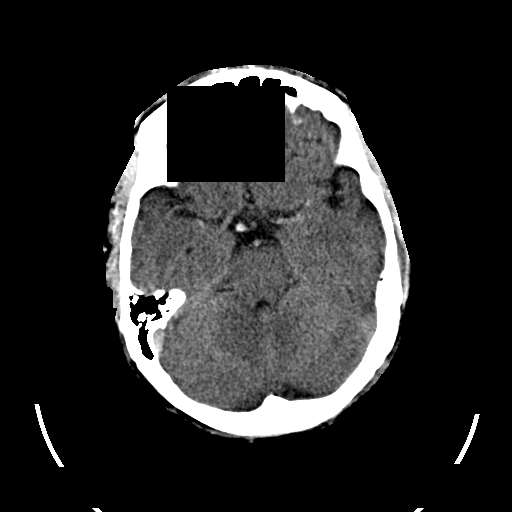
[im 10/36  bone]
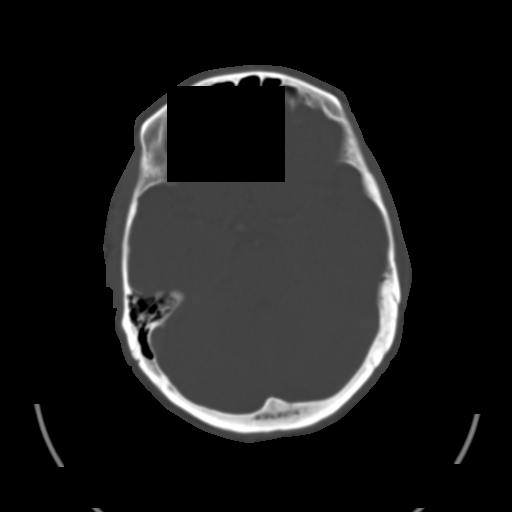
[im 13/36  brain]
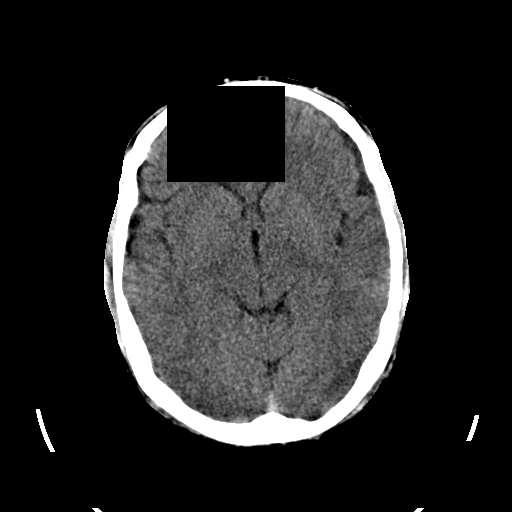
[im 15/36  brain]
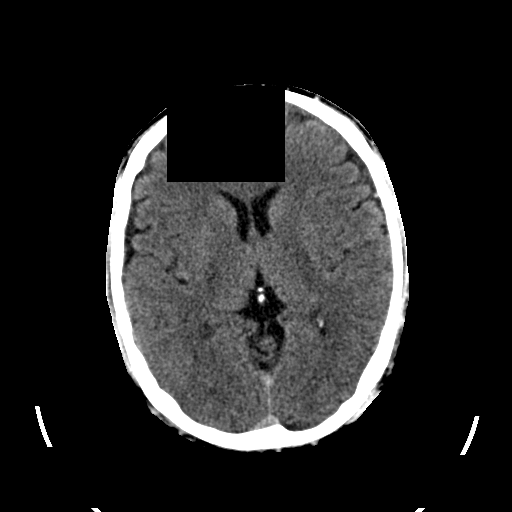
[im 17/36  brain]
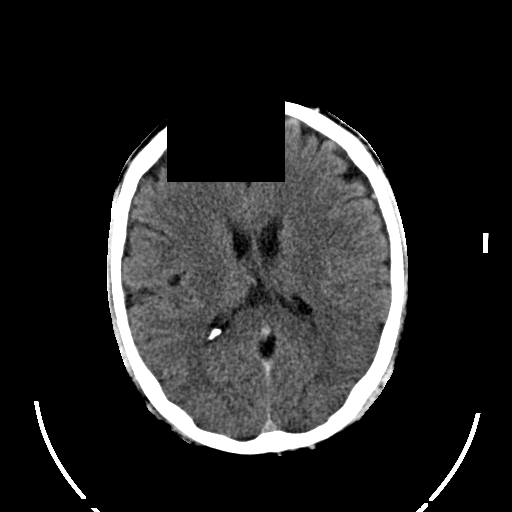
[im 19/36  brain]
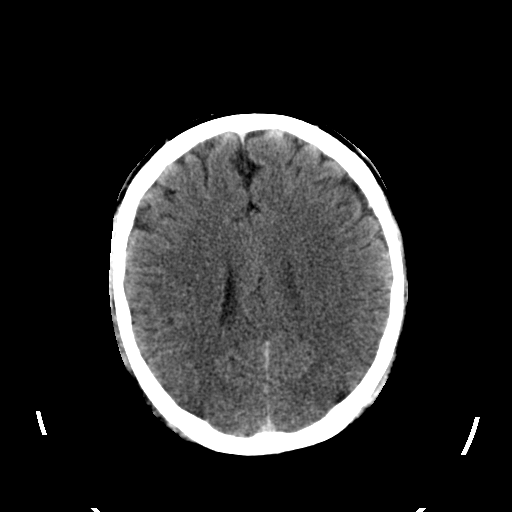
[im 19/36  bone]
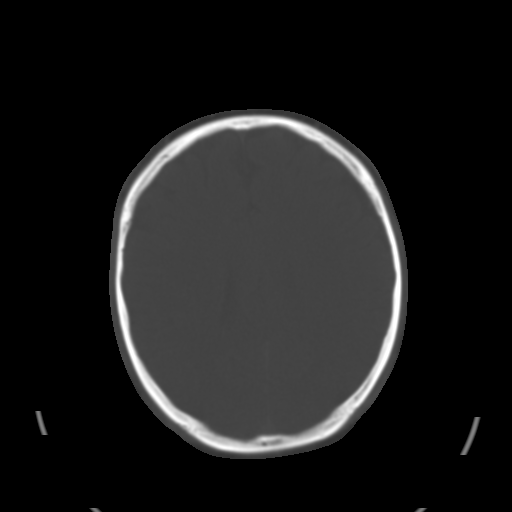
[im 21/36  brain]
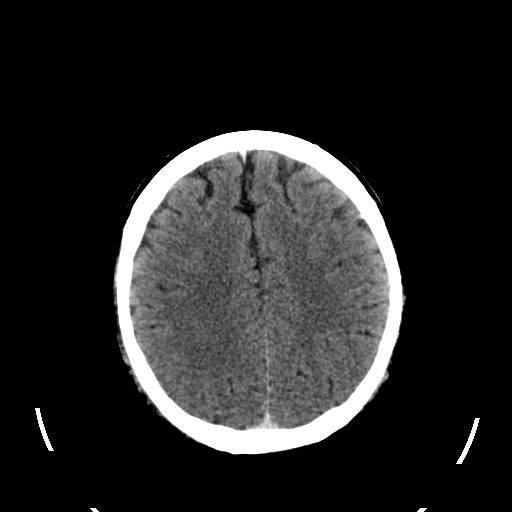
[im 23/36  brain]
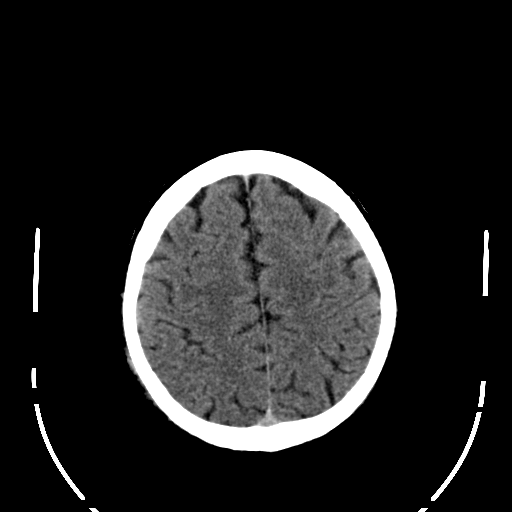
[im 26/36  brain]
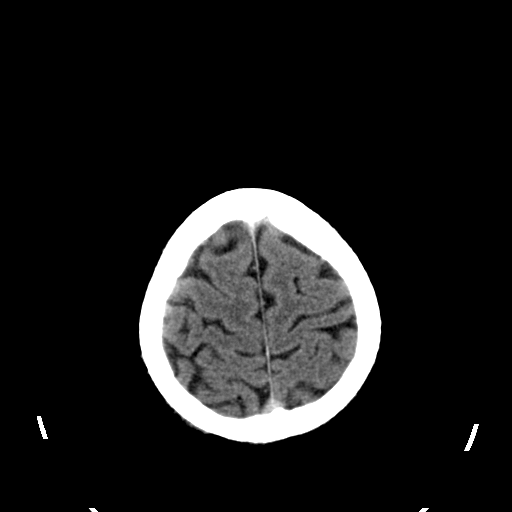
[im 27/36  brain]
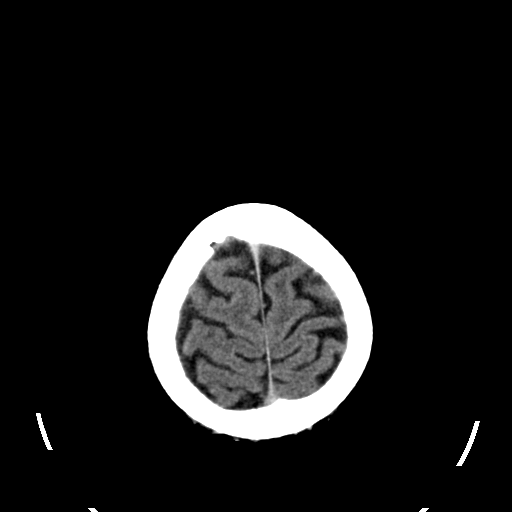
[im 27/36  bone]
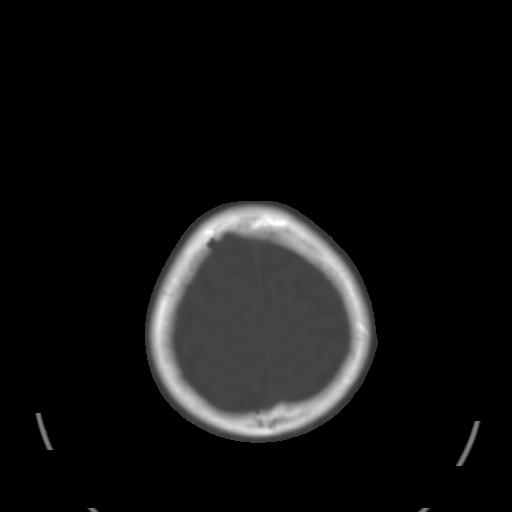
[im 29/36  brain]
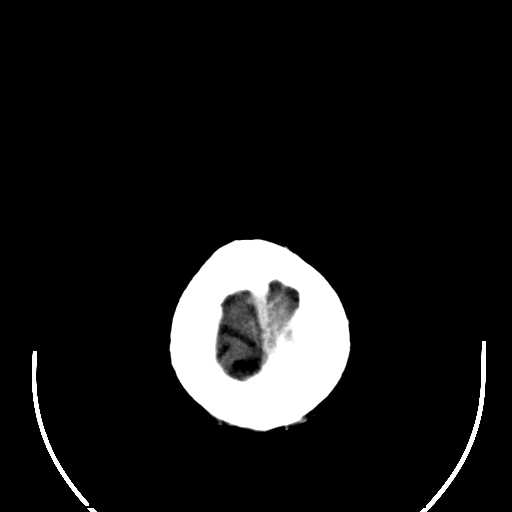
[im 32/36  brain]
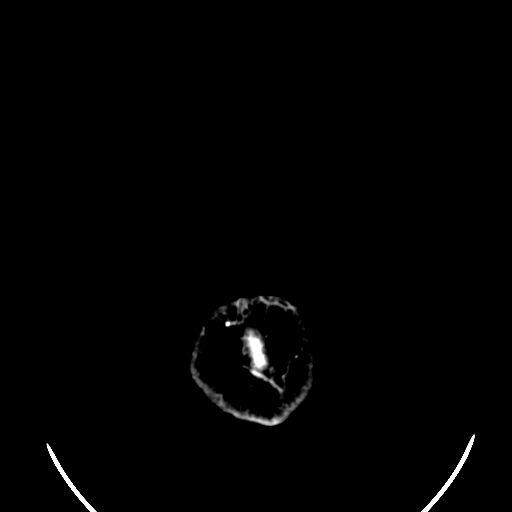
[im 34/36  brain]
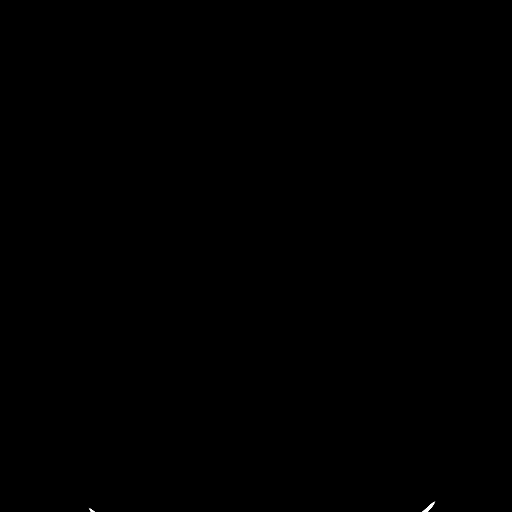

[16 of 30 positions shown; findings below may reference images not displayed]

FINDINGS: Negative for hemorrhage.  Dense left MCA has resolved.
No acute infarct.  Negative for edema.  No significant chronic
ischemia.  Ventricle size is normal.
IMPRESSION: Negative for acute hemorrhage or infarct following TPA.  Resolution
of dense left MCA branch following TPA infusion.

## 2012-03-05 IMAGING — XA IR ANGIO/CAROTID/CERV BI
1 series · 11 of 24 positions shown · IV contrast (IODINE)
Comparison: CT scan of the brain of 08/24/2010.

CLINICAL DATA: BILATERAL CAROTID ARTERIOGRAPHY AND LEFT VERTEBRAL ARTERY
ARTERIOGRAM FOLLOWED  BY ENDOVASCULAR REVASCULARIZATION OF OCCLUDED
PROMINENT INFERIOR DIVISION OF THE LEFT MIDDLE CEREBRAL ARTERY
USING SUPERSELECTIVE INTRA-ARTERIAL INTRACRANIAL INFUSION OF TPA
AND THE PENUMBRA THROMBECTOMY DEVICE

[Series 300: neuro · 11 of 302 slices shown]
[im 14/302]
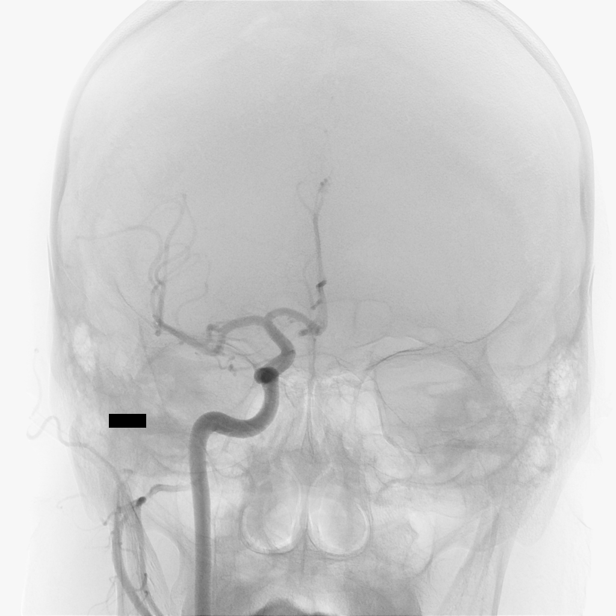
[im 40/302]
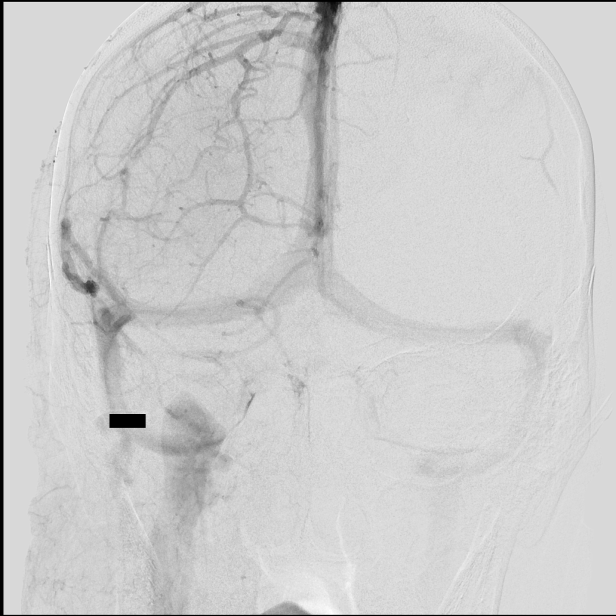
[im 66/302]
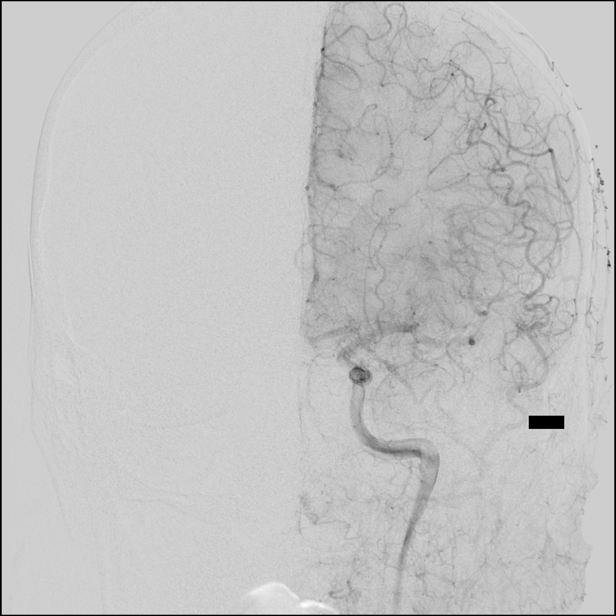
[im 92/302]
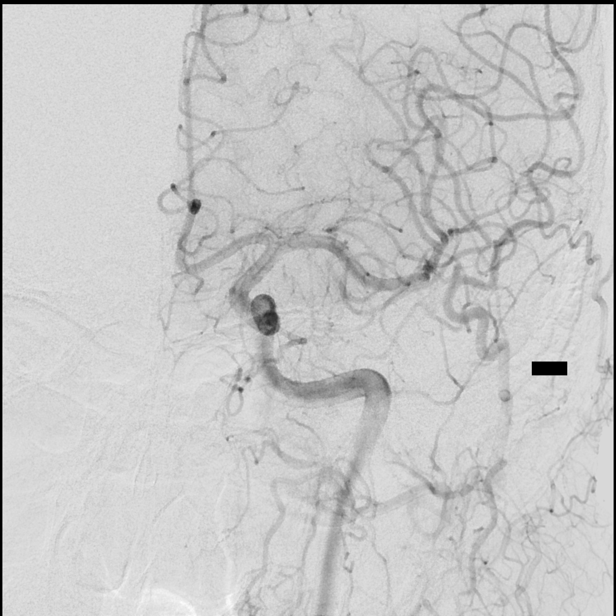
[im 118/302]
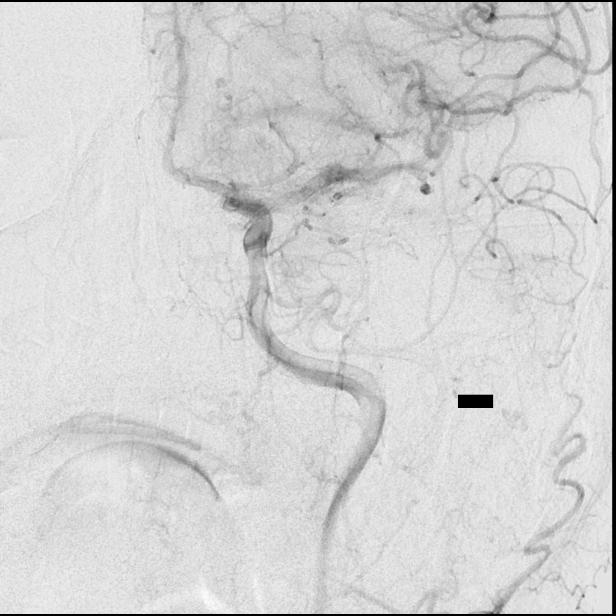
[im 158/302]
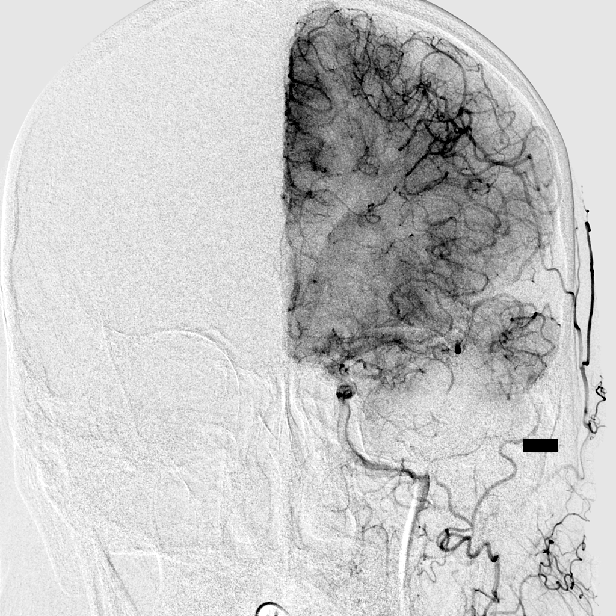
[im 184/302]
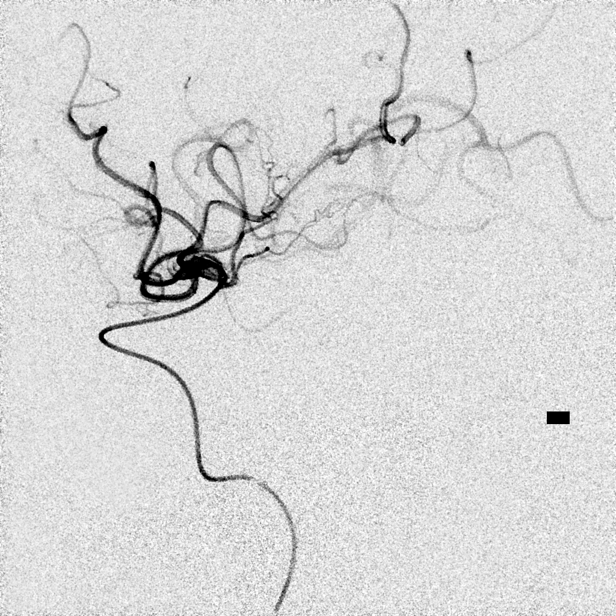
[im 210/302]
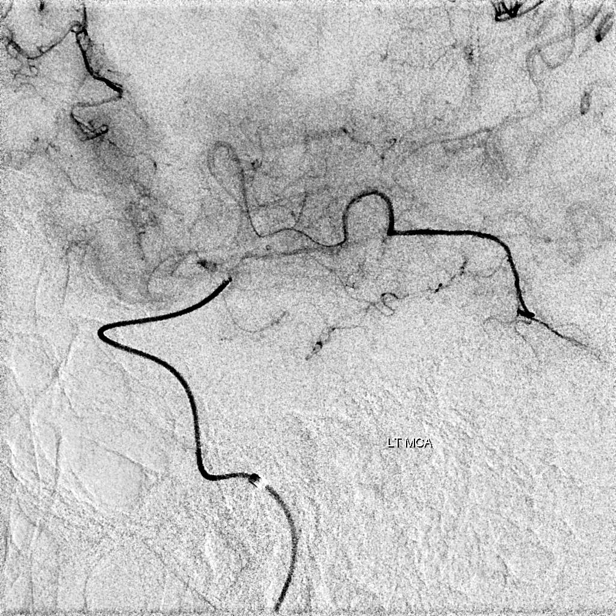
[im 236/302]
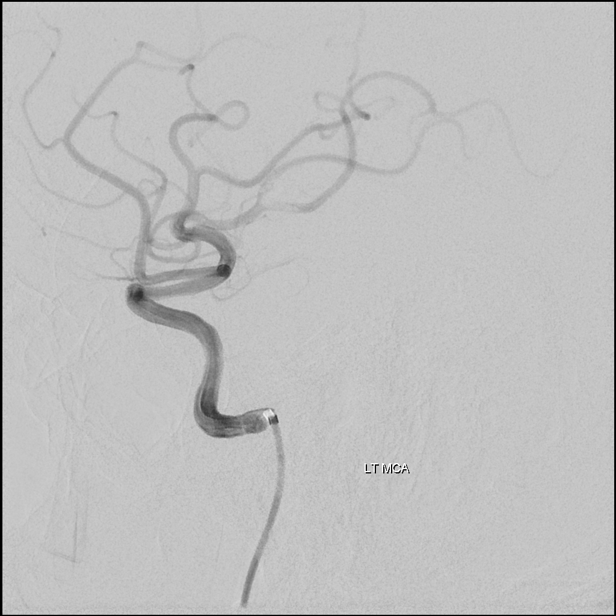
[im 262/302]
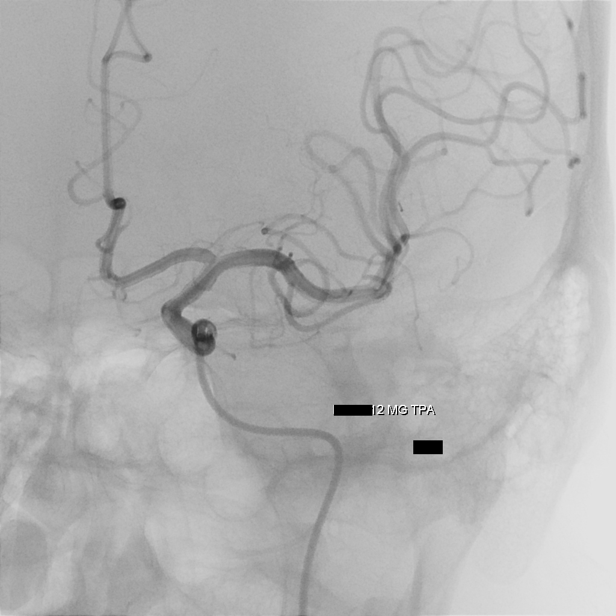
[im 288/302]
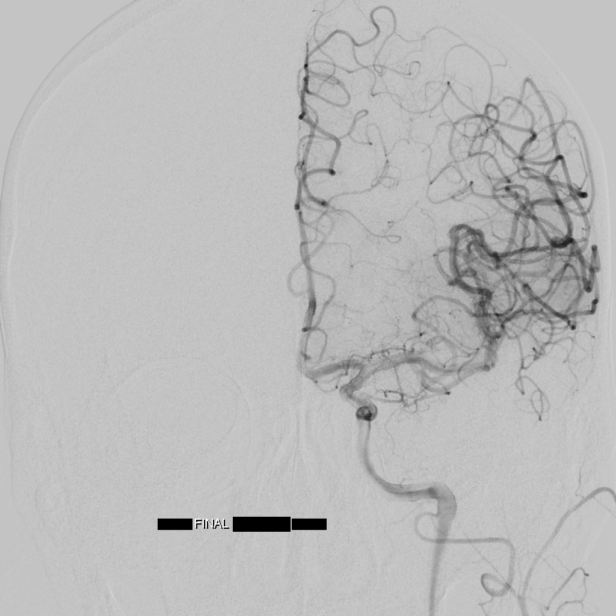

[11 of 24 positions shown; findings below may reference images not displayed]

Following a full explanation of the procedure along with the
potential associated complications, an informed witnessed consent
was obtained.

The right groin was prepped and draped in the usual sterile
fashion.  Thereafter using a modified Seldinger technique,
transfemoral access into the right common femoral artery was
obtained without difficulty.  Over a 0.035-inch guidewire, a 5-
French Pinnacle sheath was inserted.  Through this and also over a
0.035-inch guidewire, a 5-French JB1 catheter was advanced to the
aortic arch region and selectively positioned in the right common
carotid artery, the left common carotid artery, and the left
vertebral artery.

There were no acute complications.  The patient tolerated the
procedure well.

Medications utilized: Versed 0.5 mg IV.  Fentanyl 12.5 mcg IV.

Contrast: Emnipaque-0CC approximately 30 ml.
FINDINGS: The right common carotid arteriogram demonstrates the
right external carotid artery and its major branches to be normal.

The right internal carotid artery at the bulb to the cranial skull
base is also normally opacified.

The petrous, the cavernous and the supraclinoid segments are
normal.  The right middle and the right anterior cerebral arteries
are seen to opacify normally into the capillary and venous phases.
Transient opacification of the right posterior communicating artery
and subsequently the right posterior cerebral artery is noted.

The left common carotid arteriogram demonstrates the left external
carotid artery and its major branches to be normal.

The left internal carotid artery at the bulb to the cranial skull
base opacifies normally.

The petrous, cavernous and the supraclinoid segments are normally
opacified.

The left middle and the left anterior cerebral arteries opacify
normally into the capillary and venous phases.

There is truncation with complete occlusion of the prominent
inferior division of the left middle cerebral artery.  A large area
of absent to hypoperfusion is noted involving the entire left
parietal and part of the left occipital regions.

The left vertebral artery origin is normal.  The vessel opacifies
normally to the cranial skull base.  Normal opacification of the
left posterior inferior cerebellar artery and the left
vertebrobasilar junction is noted.

The basilar artery, the posterior cerebral arteries, superior
cerebellar arteries and the anterior inferior cerebellar arteries
opacify normally into the capillary and venous phases.

Attempt at retrograde leptomeningeal collateralization from the
distal left P3 segments is seen.
IMPRESSION: 1.  Complete angiographic occlusion of the prominent inferior
division of the left middle cerebral artery.

ENDOVASCULAR REVASCULARIZATION OF OCCLUDED LEFT MIDDLE CEREBRAL
ARTERY PROMINENT INFERIOR DIVISION USING SUPERSELECTIVE
INTRACRANIAL INTRA-ARTERIAL INFUSION OF TPA AND THE PENUMBRA
THROMBECTOMY DEVICE

The angiographic findings were discussed with the referring
neurologist.  The option of endovascular revascularization with a
view to preventing further neurological injury, and potentially to
improve subsequent neurological outcome was discussed with the
patient's spouse.  The risks of intracranial hemorrhage, worsening
neurological condition, ventilator dependency, death and inability
to revascularize were discussed with the patient's spouse.

After informed consent, under general anesthesia, the diagnostic 5-
French JB1 catheter in the left common carotid artery was exchanged
over a 0.035-inch 300 cm Rosen exchange guidewire using biplane
roadmap technique and constant fluoroscopic guidance, for a 6-
French 90 cm Kjell Arild Nheli.  Good aspiration was obtained
from the side port of the Tuohy-Borst on the Kjell Arild Nheli.
A gentle contrast injection demonstrated no evidence of spasm,
dissections or of intraluminal filling defects.

This was then connected to continuous heparinized saline infusion.

Through the 6-Curton, Fitzory combination of  054
Penumbra  reperfusion microcatheter and an 032  Penumbra
reperfusion microcatheter over a 0.014-inch Softip Synchro was
advanced to the distal end of the 6-Djameson Torcel.

The microguidewire leading with a J-tip configuration, to avoid
dissections or inducing spasm, the combination was navigated
without difficulty into the cavernous segment of the left internal
carotid artery.

The microguidewire was then manipulated using the torque device and
advanced into the left middle cerebral artery followed by the
combination of the two microcatheters.  The microcatheter was then
manipulated across the occluded prominent inferior division of the
left middle cerebral artery and advanced into the M3 region
followed by the microcatheter combination.

The distal end of the 032 reperfusion catheter was positioned
within the clot.  The microguidewire was then retrieved.
Significant resistance to aspiration was noticeable through the hub
of the 032.  Arteriogram performed through the 6-Gjeto Ke
Dem Deck Nazid Duuood demonstrated safe positioning of the tip of the
microcatheter.

In a controlled fashion, approximately 8 mg of intra-arterial TPA
was then infused in 8 ml of normal saline over approximately 8-10
minutes.

This demonstrated flow through the occluded inferior division with
multiple filling defects noted in the A2 main branches of this
vessel.

This prompted the advancement in a coaxial manner through the 054
reperfusion catheter of an 18L Merci microcatheter which had been
steam-shaped, over a 0.014-inch Softip Synchro microguidewire.  The
18L Merci microcatheter was then positioned just proximal to the
branch with the filling defects.

An additional 4 mg of intra-arterial TPA was then infused in 4 ml
of normal saline.

At the end of this a control arteriogram performed through the 054
reperfusion microcatheter demonstrated complete angiographic
vascularization and opacification of the previously hypoperfused
left parietal and the left occipital regions.

No filling defects were noted in the distal vasculature.

The reperfusion microcatheter and the Kjell Arild Nheli were then
retrieved into the abdominal aorta and exchanged over a J-tip
guidewire for a 7-French Pinnacle sheath.  This was then connected
to continuous heparinized saline infusion.

There were no acute changes noted in the patient's blood pressure
or neurological status.

The patient tolerated the procedure well.

The patient was then transferred extubated to the CT scanner for
postprocedural CT scan of the brain.

IMPRESSION
1.  Status post endovascular complete revascularization of occluded
prominent inferior division of the left middle cerebral artery
using superselective intracranial intra-arterial TPA infusion and
the Penumbra THROMBECTOMY DEVICE.

## 2012-03-06 ENCOUNTER — Ambulatory Visit (INDEPENDENT_AMBULATORY_CARE_PROVIDER_SITE_OTHER): Payer: Managed Care, Other (non HMO)

## 2012-03-06 DIAGNOSIS — I6350 Cerebral infarction due to unspecified occlusion or stenosis of unspecified cerebral artery: Secondary | ICD-10-CM

## 2012-03-06 DIAGNOSIS — I639 Cerebral infarction, unspecified: Secondary | ICD-10-CM

## 2012-03-06 DIAGNOSIS — I4891 Unspecified atrial fibrillation: Secondary | ICD-10-CM

## 2012-03-06 DIAGNOSIS — Z7901 Long term (current) use of anticoagulants: Secondary | ICD-10-CM

## 2012-03-15 ENCOUNTER — Other Ambulatory Visit: Payer: Self-pay | Admitting: Internal Medicine

## 2012-03-15 MED ORDER — SPIRONOLACTONE 25 MG PO TABS: 25.0000 mg | ORAL_TABLET | Freq: Every day | ORAL | Status: DC

## 2012-04-03 ENCOUNTER — Ambulatory Visit (INDEPENDENT_AMBULATORY_CARE_PROVIDER_SITE_OTHER): Payer: Managed Care, Other (non HMO) | Admitting: *Deleted

## 2012-04-03 DIAGNOSIS — Z7901 Long term (current) use of anticoagulants: Secondary | ICD-10-CM

## 2012-04-03 DIAGNOSIS — I6350 Cerebral infarction due to unspecified occlusion or stenosis of unspecified cerebral artery: Secondary | ICD-10-CM

## 2012-04-03 DIAGNOSIS — I639 Cerebral infarction, unspecified: Secondary | ICD-10-CM

## 2012-04-03 DIAGNOSIS — I4891 Unspecified atrial fibrillation: Secondary | ICD-10-CM

## 2012-04-06 ENCOUNTER — Telehealth: Payer: Self-pay | Admitting: *Deleted

## 2012-04-06 NOTE — Telephone Encounter (Signed)
I left a message for the patient to call and let him know his "Wellness" Form has been filled out. This is on Dr. Odessa Fleming cart.

## 2012-04-09 ENCOUNTER — Telehealth: Payer: Self-pay | Admitting: Internal Medicine

## 2012-04-09 NOTE — Telephone Encounter (Signed)
Patient returning nurse call, he can be reached at [phone]

## 2012-04-09 NOTE — Telephone Encounter (Signed)
Sherri Rad, RN 04/06/2012 6:21 PM Signed  I left a message for the patient to call and let him know his "Wellness" Form has been filled out. This is on Dr. Odessa Fleming cart.    Patient aware and will pick up forms tomorrow

## 2012-04-10 ENCOUNTER — Telehealth: Payer: Self-pay | Admitting: Internal Medicine

## 2012-04-10 NOTE — Telephone Encounter (Signed)
Pt picked up a copy of the completed wellness form.

## 2012-05-15 ENCOUNTER — Ambulatory Visit (INDEPENDENT_AMBULATORY_CARE_PROVIDER_SITE_OTHER): Payer: Managed Care, Other (non HMO)

## 2012-05-15 DIAGNOSIS — I4891 Unspecified atrial fibrillation: Secondary | ICD-10-CM

## 2012-05-15 DIAGNOSIS — I639 Cerebral infarction, unspecified: Secondary | ICD-10-CM

## 2012-05-15 DIAGNOSIS — Z7901 Long term (current) use of anticoagulants: Secondary | ICD-10-CM

## 2012-05-15 DIAGNOSIS — I6350 Cerebral infarction due to unspecified occlusion or stenosis of unspecified cerebral artery: Secondary | ICD-10-CM

## 2012-06-05 ENCOUNTER — Ambulatory Visit (INDEPENDENT_AMBULATORY_CARE_PROVIDER_SITE_OTHER): Payer: Managed Care, Other (non HMO) | Admitting: General Practice

## 2012-06-05 DIAGNOSIS — Z23 Encounter for immunization: Secondary | ICD-10-CM

## 2012-06-07 ENCOUNTER — Other Ambulatory Visit: Payer: Self-pay | Admitting: Internal Medicine

## 2012-06-12 ENCOUNTER — Ambulatory Visit (INDEPENDENT_AMBULATORY_CARE_PROVIDER_SITE_OTHER): Payer: Managed Care, Other (non HMO) | Admitting: *Deleted

## 2012-06-12 DIAGNOSIS — I4891 Unspecified atrial fibrillation: Secondary | ICD-10-CM

## 2012-06-12 DIAGNOSIS — Z7901 Long term (current) use of anticoagulants: Secondary | ICD-10-CM

## 2012-06-12 DIAGNOSIS — I6350 Cerebral infarction due to unspecified occlusion or stenosis of unspecified cerebral artery: Secondary | ICD-10-CM

## 2012-06-12 DIAGNOSIS — I639 Cerebral infarction, unspecified: Secondary | ICD-10-CM

## 2012-06-12 LAB — POCT INR: INR: 3

## 2012-07-10 ENCOUNTER — Ambulatory Visit (INDEPENDENT_AMBULATORY_CARE_PROVIDER_SITE_OTHER): Payer: Managed Care, Other (non HMO)

## 2012-07-10 DIAGNOSIS — I4891 Unspecified atrial fibrillation: Secondary | ICD-10-CM

## 2012-07-10 DIAGNOSIS — Z7901 Long term (current) use of anticoagulants: Secondary | ICD-10-CM

## 2012-07-10 DIAGNOSIS — I6350 Cerebral infarction due to unspecified occlusion or stenosis of unspecified cerebral artery: Secondary | ICD-10-CM

## 2012-07-10 DIAGNOSIS — I639 Cerebral infarction, unspecified: Secondary | ICD-10-CM

## 2012-08-08 ENCOUNTER — Encounter: Payer: Self-pay | Admitting: Internal Medicine

## 2012-08-08 ENCOUNTER — Ambulatory Visit (INDEPENDENT_AMBULATORY_CARE_PROVIDER_SITE_OTHER): Payer: Managed Care, Other (non HMO) | Admitting: Internal Medicine

## 2012-08-08 ENCOUNTER — Ambulatory Visit (INDEPENDENT_AMBULATORY_CARE_PROVIDER_SITE_OTHER): Payer: Managed Care, Other (non HMO) | Admitting: Pharmacist

## 2012-08-08 ENCOUNTER — Encounter: Payer: Self-pay | Admitting: *Deleted

## 2012-08-08 VITALS — BP 126/60 | HR 51 | Ht 75.6 in | Wt 207.6 lb

## 2012-08-08 DIAGNOSIS — I429 Cardiomyopathy, unspecified: Secondary | ICD-10-CM

## 2012-08-08 DIAGNOSIS — I4891 Unspecified atrial fibrillation: Secondary | ICD-10-CM

## 2012-08-08 DIAGNOSIS — Z7901 Long term (current) use of anticoagulants: Secondary | ICD-10-CM

## 2012-08-08 DIAGNOSIS — I6350 Cerebral infarction due to unspecified occlusion or stenosis of unspecified cerebral artery: Secondary | ICD-10-CM

## 2012-08-08 DIAGNOSIS — R7402 Elevation of levels of lactic acid dehydrogenase (LDH): Secondary | ICD-10-CM

## 2012-08-08 DIAGNOSIS — I639 Cerebral infarction, unspecified: Secondary | ICD-10-CM

## 2012-08-08 LAB — TSH: TSH: 1.11 u[IU]/mL (ref 0.35–5.50)

## 2012-08-08 LAB — COMPREHENSIVE METABOLIC PANEL
AST: 27 U/L (ref 0–37)
Albumin: 4.6 g/dL (ref 3.5–5.2)
Alkaline Phosphatase: 55 U/L (ref 39–117)
Glucose, Bld: 101 mg/dL — ABNORMAL HIGH (ref 70–99)
Potassium: 4.4 mEq/L (ref 3.5–5.1)
Sodium: 140 mEq/L (ref 135–145)
Total Protein: 8.8 g/dL — ABNORMAL HIGH (ref 6.0–8.3)

## 2012-08-08 LAB — CBC WITH DIFFERENTIAL/PLATELET
Eosinophils Absolute: 0 10*3/uL (ref 0.0–0.7)
MCHC: 33.9 g/dL (ref 30.0–36.0)
MCV: 95.8 fl (ref 78.0–100.0)
Monocytes Absolute: 0.5 10*3/uL (ref 0.1–1.0)
Neutrophils Relative %: 55.6 % (ref 43.0–77.0)
Platelets: 210 10*3/uL (ref 150.0–400.0)
RDW: 13 % (ref 11.5–14.6)

## 2012-08-08 LAB — APTT: aPTT: 34.2 s — ABNORMAL HIGH (ref 21.7–28.8)

## 2012-08-08 NOTE — Progress Notes (Signed)
Patient Care Team: Michael E Norins, MD as PCP - General (Internal Medicine) Michael E Norins, MD as Attending Physician (Internal Medicine)   HPI  Tyler Sims is a 55 y.o. male Se en in followup for nonischemic cardiomyopathy with which he presented with a stroke and atrial fibrillation. It was initially hoped that this was secondary to the rate. However, despite restoration of sinus rhythm, he has had persistent left ventricular dysfunction. After 3 months he refused to continue to wear his life Vest He also declined ICD implantation at that time ejection fraction remains below 25-30%. Echo sept 2012 40%     The patient denies chest pain, shortness of breath, nocturnal dyspnea, orthopnea or peripheral edema. There have been no palpitations, lightheadedness or syncope. He is currently in atrial fibrillation but has no palpitations   Past Medical History  Diagnosis Date  . Stroke 08/24/2010  . Systolic CHF   . Persistent atrial fibrillation 08/24/2010  . NICM (nonischemic cardiomyopathy)   . Bradycardia     nocturnal  . Ventricular tachycardia, non-sustained   . Mitral regurgitation   . Varicella   . Mumps   . Measles (roseola)   . Abnormal transaminases   . Acid reflux   . Hypertension   . Colon polyps 02/07/2012    Dr. Carl Gessner    Past Surgical History  Procedure Date  . Cardiac catheterization   . Colonoscopy w/ biopsies 02/07/2012    Dr. Carl Gessner    Current Outpatient Prescriptions  Medication Sig Dispense Refill  . famotidine (PEPCID) 20 MG tablet Take 20 mg by mouth daily.      . losartan (COZAAR) 25 MG tablet TAKE 1 TABLET ONCE DAILY  90 tablet  3  . metoprolol succinate (TOPROL-XL) 50 MG 24 hr tablet Take 25 mg by mouth daily.       . pravastatin (PRAVACHOL) 40 MG tablet Take one tablet by mouth twice a week  30 tablet  2  . spironolactone (ALDACTONE) 25 MG tablet Take 1/2 Tablet daily      . warfarin (COUMADIN) 5 MG tablet TAKE AS DIRECTED BY  ANTICOAGULATION CLINIC  45 tablet  3  . [DISCONTINUED] rosuvastatin (CRESTOR) 5 MG tablet Take one tablet by mouth twice a week.      . [DISCONTINUED] spironolactone (ALDACTONE) 25 MG tablet Take 1 tablet (25 mg total) by mouth daily.  30 tablet  0    Allergies  Allergen Reactions  . Lisinopril Rash  . Simvastatin Rash    Review of Systems negative except from HPI and PMH  Physical Exam BP 126/60  Pulse 51  Ht 6' 3.6" (1.92 m)  Wt 207 lb 9.6 oz (94.167 kg)  BMI 25.54 kg/m2  SpO2 98% Well developed and well nourished in no acute distress HENT normal E scleral and icterus clear Neck Supple JVP flat; carotids brisk and full Clear to ausculation Slow aand irregular rate and rhythm, no murmurs gallops or rub Soft with active bowel sounds No clubbing cyanosis none Edema Alert and oriented, grossly normal motor and sensory function Skin Warm and Dry  electrocardiogram demonstrates atrial fibrillation at 51 Intervals-/09/43 Axis IV Assessment and  Plan  

## 2012-08-08 NOTE — Assessment & Plan Note (Signed)
The patient has recurrent persistent atrial fibrillation. He is on Coumadin. We will check his INRs and then proceed with cardioversion. We had a lengthy discussion regarding the value of cardioversion. In the absence of the need for drug therapy I think the potential benefits far outweigh the risk. In the event that he has recurrence we also discussed the potential value of antiarrhythmic drug therapy with bowels as of sinus rhythm rate against arrhythmia. We also discussed the role of catheter ablation as part of  cabana trial

## 2012-08-08 NOTE — Assessment & Plan Note (Signed)
We'll repeat transaminases and continue his

## 2012-08-08 NOTE — Patient Instructions (Addendum)
LABS TODAY:  Cmet, cbc, pt/inr, ptt, tsh

## 2012-08-08 NOTE — Assessment & Plan Note (Signed)
He is on triple drug therapy. We'll check his metabolic profile

## 2012-08-15 ENCOUNTER — Encounter (HOSPITAL_COMMUNITY): Payer: Self-pay | Admitting: Certified Registered Nurse Anesthetist

## 2012-08-15 ENCOUNTER — Ambulatory Visit (HOSPITAL_COMMUNITY): Payer: Managed Care, Other (non HMO) | Admitting: Certified Registered Nurse Anesthetist

## 2012-08-15 ENCOUNTER — Ambulatory Visit (HOSPITAL_COMMUNITY)
Admission: RE | Admit: 2012-08-15 | Discharge: 2012-08-15 | Disposition: A | Discharge status: Home / Self Care | Payer: Managed Care, Other (non HMO) | Source: Ambulatory Visit | Attending: Internal Medicine | Admitting: Internal Medicine

## 2012-08-15 ENCOUNTER — Encounter (HOSPITAL_COMMUNITY)
Admission: RE | Disposition: A | Discharge status: Home / Self Care | Payer: Self-pay | Source: Ambulatory Visit | Attending: Internal Medicine

## 2012-08-15 ENCOUNTER — Encounter (HOSPITAL_COMMUNITY): Payer: Self-pay | Admitting: Gastroenterology

## 2012-08-15 DIAGNOSIS — I4891 Unspecified atrial fibrillation: Secondary | ICD-10-CM | POA: Insufficient documentation

## 2012-08-15 DIAGNOSIS — I10 Essential (primary) hypertension: Secondary | ICD-10-CM | POA: Insufficient documentation

## 2012-08-15 HISTORY — PX: CARDIOVERSION: SHX1299

## 2012-08-15 LAB — PROTIME-INR
INR: 2.24 — ABNORMAL HIGH (ref 0.00–1.49)
Prothrombin Time: 23.8 seconds — ABNORMAL HIGH (ref 11.6–15.2)

## 2012-08-15 SURGERY — CARDIOVERSION
Anesthesia: Monitor Anesthesia Care

## 2012-08-15 MED ORDER — PROPOFOL 10 MG/ML IV BOLUS
INTRAVENOUS | Status: DC | PRN
  Administered 2012-08-15: 140 mg via INTRAVENOUS

## 2012-08-15 MED ORDER — SODIUM CHLORIDE 0.9 % IV SOLN
INTRAVENOUS | Status: DC
  Administered 2012-08-15: 500 mL via INTRAVENOUS

## 2012-08-15 NOTE — Interval H&P Note (Signed)
History and Physical Interval Note:  08/15/2012 11:01 AM  Tyler Sims  has presented today for surgery, with the diagnosis of aFIB  The various methods of treatment have been discussed with the patient and family. After consideration of risks, benefits and other options for treatment, the patient has consented to  Procedure(s) (LRB) with comments: CARDIOVERSION (N/A) as a surgical intervention .  The patient's history has been reviewed, patient examined, no change in status, stable for surgery.  I have reviewed the patient's chart and labs.  Questions were answered to the patient's satisfaction.     Sherryl Manges

## 2012-08-15 NOTE — Op Note (Signed)
Preop Dx afib Post op DX  NSR   Procedure  DC Cardioversion   Pt was sedated by anesthesia receiving 140 mg Propafol  A synchronized shock 120 joules restored sinus Rhythm   Pt tolerated without difficulty   

## 2012-08-15 NOTE — Anesthesia Preprocedure Evaluation (Addendum)
Anesthesia Evaluation  Patient identified by MRN, date of birth, ID band Patient awake    Reviewed: Allergy & Precautions, H&P , NPO status , Patient's Chart, lab work & pertinent test results, reviewed documented beta blocker date and time   History of Anesthesia Complications Negative for: history of anesthetic complications  Airway Mallampati: II TM Distance: >3 FB Neck ROM: Full    Dental  (+) Teeth Intact and Dental Advisory Given   Pulmonary neg pulmonary ROS,    Pulmonary exam normal       Cardiovascular hypertension, Pt. on home beta blockers and Pt. on medications +CHF + dysrhythmias Atrial Fibrillation Rhythm:Irregular Rate:Bradycardia     Neuro/Psych CVA, No Residual Symptoms negative psych ROS   GI/Hepatic Neg liver ROS, GERD-  Medicated and Controlled,  Endo/Other  negative endocrine ROS  Renal/GU negative Renal ROS     Musculoskeletal   Abdominal Normal abdominal exam  (+)   Peds  Hematology   Anesthesia Other Findings   Reproductive/Obstetrics                         Anesthesia Physical Anesthesia Plan  ASA: III  Anesthesia Plan: General   Post-op Pain Management:    Induction: Intravenous  Airway Management Planned: Mask  Additional Equipment:   Intra-op Plan:   Post-operative Plan:   Informed Consent: I have reviewed the patients History and Physical, chart, labs and discussed the procedure including the risks, benefits and alternatives for the proposed anesthesia with the patient or authorized representative who has indicated his/her understanding and acceptance.   Dental advisory given  Plan Discussed with: Anesthesiologist and Surgeon  Anesthesia Plan Comments:         Anesthesia Quick Evaluation

## 2012-08-15 NOTE — Preoperative (Signed)
Beta Blockers   Reason not to administer Beta Blockers:Not Applicable, took metoprolol this am 

## 2012-08-15 NOTE — Anesthesia Postprocedure Evaluation (Signed)
  Anesthesia Post-op Note  Patient: Tyler Sims  Procedure(s) Performed: Procedure(s) (LRB) with comments: CARDIOVERSION (N/A)  Patient Location: Endoscopy Unit  Anesthesia Type:General  Level of Consciousness: awake, alert  and oriented  Airway and Oxygen Therapy: Patient Spontanous Breathing  Post-op Pain: none  Post-op Assessment: Post-op Vital signs reviewed, Patient's Cardiovascular Status Stable, Respiratory Function Stable, Patent Airway and No signs of Nausea or vomiting  Post-op Vital Signs: Reviewed and stable  Complications: No apparent anesthesia complications

## 2012-08-15 NOTE — H&P (View-Only) (Signed)
Patient Care Team: Jacques Navy, MD as PCP - General (Internal Medicine) Jacques Navy, MD as Attending Physician (Internal Medicine)   HPI  Tyler Sims is a 56 y.o. male Se en in followup for nonischemic cardiomyopathy with which he presented with a stroke and atrial fibrillation. It was initially hoped that this was secondary to the rate. However, despite restoration of sinus rhythm, he has had persistent left ventricular dysfunction. After 3 months he refused to continue to wear his life Vest He also declined ICD implantation at that time ejection fraction remains below 25-30%. Echo sept 2012 40%     The patient denies chest pain, shortness of breath, nocturnal dyspnea, orthopnea or peripheral edema. There have been no palpitations, lightheadedness or syncope. He is currently in atrial fibrillation but has no palpitations   Past Medical History  Diagnosis Date  . Stroke 08/24/2010  . Systolic CHF   . Persistent atrial fibrillation 08/24/2010  . NICM (nonischemic cardiomyopathy)   . Bradycardia     nocturnal  . Ventricular tachycardia, non-sustained   . Mitral regurgitation   . Varicella   . Mumps   . Measles (roseola)   . Abnormal transaminases   . Acid reflux   . Hypertension   . Colon polyps 02/07/2012    Dr. Stan Head    Past Surgical History  Procedure Date  . Cardiac catheterization   . Colonoscopy w/ biopsies 02/07/2012    Dr. Stan Head    Current Outpatient Prescriptions  Medication Sig Dispense Refill  . famotidine (PEPCID) 20 MG tablet Take 20 mg by mouth daily.      Marland Kitchen losartan (COZAAR) 25 MG tablet TAKE 1 TABLET ONCE DAILY  90 tablet  3  . metoprolol succinate (TOPROL-XL) 50 MG 24 hr tablet Take 25 mg by mouth daily.       . pravastatin (PRAVACHOL) 40 MG tablet Take one tablet by mouth twice a week  30 tablet  2  . spironolactone (ALDACTONE) 25 MG tablet Take 1/2 Tablet daily      . warfarin (COUMADIN) 5 MG tablet TAKE AS DIRECTED BY  ANTICOAGULATION CLINIC  45 tablet  3  . [DISCONTINUED] rosuvastatin (CRESTOR) 5 MG tablet Take one tablet by mouth twice a week.      . [DISCONTINUED] spironolactone (ALDACTONE) 25 MG tablet Take 1 tablet (25 mg total) by mouth daily.  30 tablet  0    Allergies  Allergen Reactions  . Lisinopril Rash  . Simvastatin Rash    Review of Systems negative except from HPI and PMH  Physical Exam BP 126/60  Pulse 51  Ht 6' 3.6" (1.92 m)  Wt 207 lb 9.6 oz (94.167 kg)  BMI 25.54 kg/m2  SpO2 98% Well developed and well nourished in no acute distress HENT normal E scleral and icterus clear Neck Supple JVP flat; carotids brisk and full Clear to ausculation Slow aand irregular rate and rhythm, no murmurs gallops or rub Soft with active bowel sounds No clubbing cyanosis none Edema Alert and oriented, grossly normal motor and sensory function Skin Warm and Dry  electrocardiogram demonstrates atrial fibrillation at 51 Intervals-/09/43 Axis IV Assessment and  Plan

## 2012-08-15 NOTE — Transfer of Care (Signed)
Immediate Anesthesia Transfer of Care Note  Patient: Tyler Sims  Procedure(s) Performed: Procedure(s) (LRB) with comments: CARDIOVERSION (N/A)  Patient Location: Endoscopy Unit  Anesthesia Type:General  Level of Consciousness: awake, alert  and oriented  Airway & Oxygen Therapy: Patient Spontanous Breathing  Post-op Assessment: Report given to PACU RN, Post -op Vital signs reviewed and stable and Patient moving all extremities  Post vital signs: Reviewed and stable  Complications: No apparent anesthesia complications

## 2012-08-20 ENCOUNTER — Encounter (HOSPITAL_COMMUNITY): Payer: Self-pay | Admitting: Internal Medicine

## 2012-08-29 ENCOUNTER — Ambulatory Visit (INDEPENDENT_AMBULATORY_CARE_PROVIDER_SITE_OTHER): Payer: Managed Care, Other (non HMO) | Admitting: *Deleted

## 2012-08-29 DIAGNOSIS — I4891 Unspecified atrial fibrillation: Secondary | ICD-10-CM

## 2012-08-29 DIAGNOSIS — I6350 Cerebral infarction due to unspecified occlusion or stenosis of unspecified cerebral artery: Secondary | ICD-10-CM

## 2012-08-29 DIAGNOSIS — I639 Cerebral infarction, unspecified: Secondary | ICD-10-CM

## 2012-08-29 DIAGNOSIS — Z7901 Long term (current) use of anticoagulants: Secondary | ICD-10-CM

## 2012-09-03 ENCOUNTER — Telehealth: Payer: Self-pay | Admitting: Internal Medicine

## 2012-09-03 NOTE — Telephone Encounter (Signed)
New Problem:    Patient called in and left his number in case anyone wanted to give him a call about his appointment he canceled.

## 2012-09-03 NOTE — Telephone Encounter (Signed)
Noted  

## 2012-09-05 ENCOUNTER — Encounter: Payer: Managed Care, Other (non HMO) | Admitting: Nurse Practitioner

## 2012-09-25 ENCOUNTER — Other Ambulatory Visit: Payer: Self-pay | Admitting: Internal Medicine

## 2012-09-26 ENCOUNTER — Ambulatory Visit (INDEPENDENT_AMBULATORY_CARE_PROVIDER_SITE_OTHER): Payer: Managed Care, Other (non HMO) | Admitting: *Deleted

## 2012-09-26 ENCOUNTER — Other Ambulatory Visit: Payer: Self-pay | Admitting: *Deleted

## 2012-09-26 DIAGNOSIS — I639 Cerebral infarction, unspecified: Secondary | ICD-10-CM

## 2012-09-26 DIAGNOSIS — I4891 Unspecified atrial fibrillation: Secondary | ICD-10-CM

## 2012-09-26 DIAGNOSIS — I429 Cardiomyopathy, unspecified: Secondary | ICD-10-CM

## 2012-09-26 DIAGNOSIS — I6350 Cerebral infarction due to unspecified occlusion or stenosis of unspecified cerebral artery: Secondary | ICD-10-CM

## 2012-09-26 DIAGNOSIS — Z7901 Long term (current) use of anticoagulants: Secondary | ICD-10-CM

## 2012-09-26 MED ORDER — PRAVASTATIN SODIUM 40 MG PO TABS: ORAL_TABLET | ORAL | Status: DC

## 2012-09-26 NOTE — Telephone Encounter (Signed)
Refilled Pravastatin

## 2012-09-27 ENCOUNTER — Other Ambulatory Visit: Payer: Self-pay | Admitting: Internal Medicine

## 2012-09-27 ENCOUNTER — Other Ambulatory Visit: Payer: Self-pay | Admitting: Pharmacist

## 2012-09-27 MED ORDER — WARFARIN SODIUM 5 MG PO TABS: 5.0000 mg | ORAL_TABLET | ORAL | Status: DC

## 2012-10-03 ENCOUNTER — Other Ambulatory Visit: Payer: Self-pay | Admitting: Cardiovascular Disease

## 2012-10-03 ENCOUNTER — Other Ambulatory Visit (INDEPENDENT_AMBULATORY_CARE_PROVIDER_SITE_OTHER): Payer: Managed Care, Other (non HMO)

## 2012-10-03 ENCOUNTER — Ambulatory Visit (INDEPENDENT_AMBULATORY_CARE_PROVIDER_SITE_OTHER): Payer: Managed Care, Other (non HMO) | Admitting: Internal Medicine

## 2012-10-03 ENCOUNTER — Encounter: Payer: Self-pay | Admitting: Internal Medicine

## 2012-10-03 VITALS — BP 122/72 | HR 78 | Temp 98.6°F | Resp 12 | Ht 75.0 in | Wt 202.2 lb

## 2012-10-03 DIAGNOSIS — R7402 Elevation of levels of lactic acid dehydrogenase (LDH): Secondary | ICD-10-CM

## 2012-10-03 DIAGNOSIS — I4891 Unspecified atrial fibrillation: Secondary | ICD-10-CM

## 2012-10-03 DIAGNOSIS — I429 Cardiomyopathy, unspecified: Secondary | ICD-10-CM

## 2012-10-03 DIAGNOSIS — Z7901 Long term (current) use of anticoagulants: Secondary | ICD-10-CM

## 2012-10-03 DIAGNOSIS — Z23 Encounter for immunization: Secondary | ICD-10-CM

## 2012-10-03 DIAGNOSIS — I6350 Cerebral infarction due to unspecified occlusion or stenosis of unspecified cerebral artery: Secondary | ICD-10-CM

## 2012-10-03 DIAGNOSIS — E785 Hyperlipidemia, unspecified: Secondary | ICD-10-CM

## 2012-10-03 DIAGNOSIS — Z0000 Encounter for general adult medical examination without abnormal findings: Secondary | ICD-10-CM

## 2012-10-03 DIAGNOSIS — I639 Cerebral infarction, unspecified: Secondary | ICD-10-CM

## 2012-10-03 LAB — COMPREHENSIVE METABOLIC PANEL
ALT: 23 U/L (ref 0–53)
Albumin: 4.4 g/dL (ref 3.5–5.2)
CO2: 29 mEq/L (ref 19–32)
GFR: 103.29 mL/min (ref >60.00)
Glucose, Bld: 90 mg/dL (ref 70–99)
Potassium: 4.4 mEq/L (ref 3.5–5.1)
Sodium: 139 mEq/L (ref 135–145)
Total Bilirubin: 1.4 mg/dL — ABNORMAL HIGH (ref 0.3–1.2)
Total Protein: 8.5 g/dL — ABNORMAL HIGH (ref 6.0–8.3)

## 2012-10-03 LAB — HEPATIC FUNCTION PANEL
ALT: 23 U/L (ref 0–53)
AST: 28 U/L (ref 0–37)
Albumin: 4.4 g/dL (ref 3.5–5.2)

## 2012-10-03 LAB — LIPID PANEL
Total CHOL/HDL Ratio: 3
Triglycerides: 75 mg/dL (ref 0.0–149.0)

## 2012-10-03 NOTE — Progress Notes (Signed)
Subjective:    Patient ID: Tyler Sims, male    DOB: [birth year], 57 y.o.   MRN: [id number]  HPI The patient is here for annual wellness examination and management of other chronic and acute problems.  CC: he is starting to have leg and foot pain, mostly on the right 90% of the time - seemed to start aft Nov 27th [facility]: it is like joint related pain at the knee that is connected to the foot. It is also a numb like feeling.  Interval history: he did revert to A. Fib with [facility] Nov 27th. He has had follow up with neurology and is stable.   Neurology does not connect the leg pain to a CNS problem.  Colonoscopy - 1 cm adenomatous polyp transverse colon. Recall 3 years.    The risk factors are reflected in the social history.  The roster of all physicians providing medical care to patient - is listed in the Snapshot section of the chart.  Activities of daily living:  The patient is 100% inedpendent in all ADLs: dressing, toileting, feeding as well as independent mobility  Home safety : The patient has smoke detectors in the home. Falls - none. They wear seatbelts. No firearms at home  There is no risks for hepatitis, STDs or HIV. There is no   history of blood transfusion. They have no travel history to infectious disease endemic areas of the world.  The patient has  not seen their dentist in the last six month. They have seen their eye doctor in the last year. They deny any hearing difficulty and have not had audiologic testing in the last year.    They do not  have excessive sun exposure. Discussed the need for sun protection: hats, long sleeves and use of sunscreen if there is significant sun exposure.   Diet: the importance of a healthy diet is discussed. They do have a healthy diet.  The patient has a regular exercise program.  The benefits of regular aerobic exercise were discussed.  Depression screen: there are no signs or vegative symptoms of depression- irritability, change in  appetite, anhedonia, sadness/tearfullness.  Cognitive assessment: the patient manages all their financial and personal affairs and is actively engaged.   The following portions of the patient's history were reviewed and updated as appropriate: allergies, current medications, past family history, past medical history,  past surgical history, past social history  and problem list.  Past Medical History  Diagnosis Date  . Stroke 08/24/2010  . Systolic CHF   . Persistent atrial fibrillation 08/24/2010  . NICM (nonischemic cardiomyopathy)   . Bradycardia     nocturnal  . Ventricular tachycardia, non-sustained   . Mitral regurgitation   . Varicella   . Mumps   . Measles (roseola)   . Abnormal transaminases   . Acid reflux   . Hypertension   . Colon polyps 02/07/2012    Dr. Stan Head   Past Surgical History  Procedure Date  . Cardiac catheterization   . Colonoscopy w/ biopsies 02/07/2012    Dr. Stan Head  . Cardioversion 08/15/2012    Procedure: CARDIOVERSION;  Surgeon: Duke Salvia, MD;  Location[facility] ENDOSCOPY;  Service: Cardiovascular;  Laterality: N/A;   Family History  Problem Relation Age of Onset  . Alzheimer's disease Mother   . Lung cancer Brother     died from  . Arrhythmia Brother   . Hyperlipidemia Brother   . Hypertension Sister     X35  .  Heart disease Father     CAD/MI  . Hypertension Father   . Mental illness Father     h/o depression requiring hospitalization  . Hyperlipidemia Sister   . Hypertension Brother     X3  . Heart disease Brother     cardiomyopathy, a. fib  . Colon polyps Brother    History   Social History  . Marital Status: Married    Spouse Name: N/A    Number of Children: 2  . Years of Education: 16   Occupational History  . Energy manager   .     Social History Main Topics  . Smoking status: Never Smoker   . Smokeless tobacco: Never Used  . Alcohol Use: No  . Drug Use: No  . Sexually Active: Yes -- Male  partner(s)   Other Topics Concern  . Not on file   Social History Narrative   HSG, [place]. Married '82 - 2 dtrs '85, '91. 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     Current Outpatient Prescriptions on File Prior to Visit  Medication Sig Dispense Refill  . famotidine (PEPCID) 20 MG tablet Take 20 mg by mouth daily.      Marland Kitchen losartan (COZAAR) 25 MG tablet TAKE 1 TABLET ONCE DAILY  90 tablet  3  . metoprolol succinate (TOPROL-XL) 50 MG 24 hr tablet Take 1 tablet (50 mg total) by mouth daily.  15 tablet  6  . pravastatin (PRAVACHOL) 40 MG tablet Take one tablet by mouth twice a week  30 tablet  3  . spironolactone (ALDACTONE) 25 MG tablet Take 1/2 Tablet daily      . warfarin (COUMADIN) 5 MG tablet Take 1 tablet (5 mg total) by mouth as directed.  45 tablet  3  . [DISCONTINUED] rosuvastatin (CRESTOR) 5 MG tablet Take one tablet by mouth twice a week.         Vision, hearing, body mass index were assessed and reviewed.   During the course of the visit the patient was educated and counseled about appropriate screening and preventive services including : fall prevention , diabetes screening, nutrition counseling, colorectal cancer screening, and recommended immunizations.    Review of Systems Constitutional:  Negative for fever, chills, activity change and unexpected weight change.  HEENT:  Negative for hearing loss, ear pain, congestion, neck stiffness and postnasal drip. Negative for sore throat or swallowing problems. Negative for dental complaints.   Eyes: Negative for vision loss or change in visual acuity.  Respiratory: Negative for chest tightness and wheezing. Negative for DOE.   Cardiovascular: Negative for chest pain since cardioversion in Nov '13. Decreased exercise tolerance, noticed since [employer] Gastrointestinal: No change in bowel habit. No bloating or gas. No reflux or indigestion Genitourinary: Negative for urgency, frequency, flank pain and difficulty  urinating.  Musculoskeletal: Negative for myalgias, back pain, arthralgias and gait problem.  Neurological: Negative for dizziness, tremors, weakness and headaches.  Hematological: Negative for adenopathy.  Psychiatric/Behavioral: Negative for behavioral problems and dysphoric mood.       Objective:   Physical Exam Filed Vitals:   10/03/12 1316  BP: 122/72  Pulse: 78  Temp: 98.6 F (37 C)  Resp: 12   Wt Readings from Last 3 Encounters:  10/03/12 202 lb 3.2 oz (91.717 kg)  08/08/12 207 lb 9.6 oz (94.167 kg)  02/07/12 200 lb (90.719 kg)   Gen'l: Well nourished well developed AAmale in no acute distress  HEENT: Head:  Normocephalic and atraumatic. Mild droop of the right eye.  Right Ear: External ear normal. EAC - with cerumen. Left Ear: External ear normal.  EAC with cereumen. Nose: Nose normal. Mouth/Throat: Oropharynx is clear and moist. Dentition - native, in poor repair: several broken teeth with visible fragments, caries.  No gingivitis or signs of infection. No buccal or palatal lesions. Posterior pharynx clear. Eyes: Conjunctivae and sclera clear. EOM intact. Pupils are equal, round, and reactive to light. Right eye exhibits no discharge. Left eye exhibits no discharge. Neck: Normal range of motion. Neck supple. No JVD present. No tracheal deviation present. No thyromegaly present.  Cardiovascular: Normal rate, regular rhythm, no gallop, no friction rub, no murmur heard.      Quiet precordium. 2+ radial and DP pulses . No carotid bruits Pulmonary/Chest: Effort normal. No respiratory distress or increased WOB, no wheezes, no rales. No chest wall deformity or CVAT. Abdomen: Soft. Bowel sounds are normal in all quadrants. He exhibits no distension, no tenderness, no rebound or guarding, No heptosplenomegaly  Genitourinary:  deferred Musculoskeletal: Normal range of motion. He exhibits no edema and no tenderness.       Small and large joints without redness, synovial thickening or  deformity. Full range of motion preserved about all small, median and large joints.  Lymphadenopathy:    He has no cervical or supraclavicular adenopathy.  Neurological: He is alert and oriented to person, place, and time. CN II-XII intact. DTRs 2+ and symmetrical biceps, radial and patellar tendons. Cerebellar function normal with no tremor, rigidity, normal gait and station.  Skin: Skin is warm and dry. No rash noted. No erythema.  Psychiatric: He has a normal mood and affect. His behavior is normal. Thought content normal.   Lab Results  Component Value Date   WBC 4.3* 08/08/2012   HGB 15.1 08/08/2012   HCT 44.4 08/08/2012   PLT 210.0 08/08/2012   GLUCOSE 90 10/03/2012   CHOL 201* 10/03/2012   TRIG 75.0 10/03/2012   HDL 63.50 10/03/2012   LDLDIRECT 110.4 10/03/2012   LDLCALC 91 01/24/2012        ALT 23 10/03/2012   AST 28 10/03/2012        NA 139 10/03/2012   K 4.4 10/03/2012   CL 104 10/03/2012   CREATININE 1.0 10/03/2012   BUN 11 10/03/2012   CO2 29 10/03/2012   TSH 1.11 08/08/2012   PSA 1.82 08/23/2011   INR 2.0 09/26/2012   HGBA1C  Value: 4.7  08/28/2010          Assessment & Plan:  Leg pain - on exam no obvious deformity right knee, good ROM. Good peripheral circulation at rest. Normal gait  Plan Stretch and flex exercise: i.e. Toe-touches, calisthenics in general  First line mediation - Tylenol 500-1,000 mg three times a day  For persistent pain - reevaluation

## 2012-10-03 NOTE — Patient Instructions (Addendum)
Thanks for coming to see me. Give my regards to [place].  You need to have those teeth tended to.  We will sign you up for coag clinic here  Lab today. Results will be in the note you receive and you can look them up on MyChart

## 2012-10-03 NOTE — Assessment & Plan Note (Addendum)
Interval history notable for recurrent a. Fib requiring DCC; colonoscopy with adenomatous polyp. Physical exam unremarkable, no neuro deficits, normal circulation. Immunizations brought up to date. He is current with colonoscopy - recall in 3 years. Lab results reviewed - in normal range except for LDL cholesterol greater than goal. Discussed pros and cons of prostate cancer screening (USPHCTF recommendations reviewed and ACU April '13 recommendations) and he defers evaluation at this time with last PSA in '12 and due in '15.  In summary - a very nice man who is medically stable. He will return for follow up lab as above.

## 2012-10-04 MED ORDER — PRAVASTATIN SODIUM 80 MG PO TABS: ORAL_TABLET | ORAL | Status: DC

## 2012-10-04 NOTE — Assessment & Plan Note (Signed)
On full dose warfarin. Will be monitored at the [facility] coag clinic.  He does need extensive dental work/surgery and will require bridging for this.

## 2012-10-04 NOTE — Assessment & Plan Note (Signed)
Doing very well - fully functional. Is fully anticoagulated and other risk factors are controlled.  Plan - continue present regimen

## 2012-10-04 NOTE — Assessment & Plan Note (Signed)
No recent testing since Sept '12. Normal level of activity. Good risk factor modification.  Plan  Follow up 2 D echo - will be scheduled

## 2012-10-04 NOTE — Assessment & Plan Note (Signed)
LDL 110 - above goal of 80 due to history of CVA; HDL is robust and protective at 63.  Plan  Increase Pravachol to 80 mg (new Rx to pharmacy)  Follow up lab 1 month after starting new dose (order entered)

## 2012-10-04 NOTE — Assessment & Plan Note (Signed)
Follows closely with Dr. Graciela Husbands. Last [facility] Nov 27th , '13. Remains fully anticoagulated.  Rate stable and hemodynamically stable at today's exam.

## 2012-10-12 ENCOUNTER — Ambulatory Visit (HOSPITAL_COMMUNITY): Payer: Managed Care, Other (non HMO) | Attending: Internal Medicine | Admitting: Radiology

## 2012-10-12 DIAGNOSIS — I509 Heart failure, unspecified: Secondary | ICD-10-CM | POA: Insufficient documentation

## 2012-10-12 DIAGNOSIS — I379 Nonrheumatic pulmonary valve disorder, unspecified: Secondary | ICD-10-CM | POA: Insufficient documentation

## 2012-10-12 DIAGNOSIS — I369 Nonrheumatic tricuspid valve disorder, unspecified: Secondary | ICD-10-CM | POA: Insufficient documentation

## 2012-10-12 DIAGNOSIS — I428 Other cardiomyopathies: Secondary | ICD-10-CM | POA: Insufficient documentation

## 2012-10-12 DIAGNOSIS — I4891 Unspecified atrial fibrillation: Secondary | ICD-10-CM | POA: Insufficient documentation

## 2012-10-12 DIAGNOSIS — I10 Essential (primary) hypertension: Secondary | ICD-10-CM | POA: Insufficient documentation

## 2012-10-12 DIAGNOSIS — I429 Cardiomyopathy, unspecified: Secondary | ICD-10-CM

## 2012-10-12 NOTE — Progress Notes (Signed)
Echocardiogram performed.  

## 2012-10-21 ENCOUNTER — Encounter: Payer: Self-pay | Admitting: Internal Medicine

## 2012-10-24 ENCOUNTER — Ambulatory Visit (INDEPENDENT_AMBULATORY_CARE_PROVIDER_SITE_OTHER): Payer: Managed Care, Other (non HMO) | Admitting: General Practice

## 2012-10-24 DIAGNOSIS — I6350 Cerebral infarction due to unspecified occlusion or stenosis of unspecified cerebral artery: Secondary | ICD-10-CM

## 2012-10-24 DIAGNOSIS — Z7901 Long term (current) use of anticoagulants: Secondary | ICD-10-CM

## 2012-10-24 DIAGNOSIS — I4891 Unspecified atrial fibrillation: Secondary | ICD-10-CM

## 2012-10-24 DIAGNOSIS — I639 Cerebral infarction, unspecified: Secondary | ICD-10-CM

## 2012-10-25 ENCOUNTER — Other Ambulatory Visit: Payer: Self-pay | Admitting: Internal Medicine

## 2012-10-30 ENCOUNTER — Other Ambulatory Visit: Payer: Self-pay | Admitting: Cardiology

## 2012-10-30 MED ORDER — LOSARTAN POTASSIUM 25 MG PO TABS: 25.0000 mg | ORAL_TABLET | Freq: Every day | ORAL | Status: DC

## 2012-11-21 ENCOUNTER — Ambulatory Visit (INDEPENDENT_AMBULATORY_CARE_PROVIDER_SITE_OTHER): Payer: Managed Care, Other (non HMO) | Admitting: General Practice

## 2012-11-21 DIAGNOSIS — I4891 Unspecified atrial fibrillation: Secondary | ICD-10-CM

## 2012-11-21 DIAGNOSIS — Z7901 Long term (current) use of anticoagulants: Secondary | ICD-10-CM

## 2012-11-21 DIAGNOSIS — I6350 Cerebral infarction due to unspecified occlusion or stenosis of unspecified cerebral artery: Secondary | ICD-10-CM

## 2012-11-21 DIAGNOSIS — I639 Cerebral infarction, unspecified: Secondary | ICD-10-CM

## 2012-11-21 LAB — POCT INR: INR: 2.5

## 2012-11-26 ENCOUNTER — Other Ambulatory Visit: Payer: Self-pay | Admitting: Internal Medicine

## 2013-01-02 ENCOUNTER — Ambulatory Visit (INDEPENDENT_AMBULATORY_CARE_PROVIDER_SITE_OTHER): Payer: Managed Care, Other (non HMO) | Admitting: General Practice

## 2013-01-02 DIAGNOSIS — I6350 Cerebral infarction due to unspecified occlusion or stenosis of unspecified cerebral artery: Secondary | ICD-10-CM

## 2013-01-02 DIAGNOSIS — Z7901 Long term (current) use of anticoagulants: Secondary | ICD-10-CM

## 2013-01-02 DIAGNOSIS — I639 Cerebral infarction, unspecified: Secondary | ICD-10-CM

## 2013-01-02 DIAGNOSIS — I4891 Unspecified atrial fibrillation: Secondary | ICD-10-CM

## 2013-01-02 LAB — POCT INR: INR: 2.4

## 2013-02-05 ENCOUNTER — Encounter: Payer: Self-pay | Admitting: Internal Medicine

## 2013-02-05 ENCOUNTER — Ambulatory Visit (INDEPENDENT_AMBULATORY_CARE_PROVIDER_SITE_OTHER): Payer: Managed Care, Other (non HMO) | Admitting: Internal Medicine

## 2013-02-05 VITALS — BP 140/70 | HR 62 | Ht 75.0 in | Wt 203.8 lb

## 2013-02-05 DIAGNOSIS — I429 Cardiomyopathy, unspecified: Secondary | ICD-10-CM

## 2013-02-05 DIAGNOSIS — I4891 Unspecified atrial fibrillation: Secondary | ICD-10-CM

## 2013-02-05 DIAGNOSIS — R5381 Other malaise: Secondary | ICD-10-CM

## 2013-02-05 DIAGNOSIS — I498 Other specified cardiac arrhythmias: Secondary | ICD-10-CM

## 2013-02-05 DIAGNOSIS — I493 Ventricular premature depolarization: Secondary | ICD-10-CM | POA: Insufficient documentation

## 2013-02-05 DIAGNOSIS — R5383 Other fatigue: Secondary | ICD-10-CM

## 2013-02-05 DIAGNOSIS — R001 Bradycardia, unspecified: Secondary | ICD-10-CM

## 2013-02-05 DIAGNOSIS — I4949 Other premature depolarization: Secondary | ICD-10-CM

## 2013-02-05 LAB — CBC WITH DIFFERENTIAL/PLATELET
Basophils Absolute: 0.1 10*3/uL (ref 0.0–0.1)
Basophils Relative: 0.9 % (ref 0.0–3.0)
Eosinophils Relative: 0.8 % (ref 0.0–5.0)
HCT: 42.2 % (ref 39.0–52.0)
Hemoglobin: 14.8 g/dL (ref 13.0–17.0)
Lymphs Abs: 1.4 10*3/uL (ref 0.7–4.0)
Monocytes Relative: 10.7 % (ref 3.0–12.0)
Neutro Abs: 3.6 10*3/uL (ref 1.4–7.7)
RDW: 13 % (ref 11.5–14.6)

## 2013-02-05 NOTE — Assessment & Plan Note (Addendum)
No recurrent atrial fibrillation of which he is aware. He does have occasional irregularities consistent with PVCs  We also discussed the NOACs for the relative risks and benefits we will look into cost

## 2013-02-05 NOTE — Assessment & Plan Note (Signed)
No evidence of heart rate limitation. However, again if he is not explained by CBC stress testing for chronotropic competence would be appropriate

## 2013-02-05 NOTE — Assessment & Plan Note (Signed)
Asymptomatic; have reviewed the impact on his pulse

## 2013-02-05 NOTE — Patient Instructions (Addendum)
Your physician recommends that you have lab work today: cbc/sed rate  Your physician has recommended you make the following change in your medication:  1) Hold pravastatin for 1 month then call Dr. Odessa Fleming office at [phone] to let him know how you are doing.  Your physician wants you to follow-up in: 1 year. You will receive a reminder letter in the mail two months in advance. If you don't receive a letter, please call our office to schedule the follow-up appointment.

## 2013-02-05 NOTE — Assessment & Plan Note (Signed)
Continue current medications. There is no evidence of volume overload to suggest that there is worsening of left ventricular function; however, if his blood work is not come back abnormal we'll consider the repeat echo

## 2013-02-05 NOTE — Assessment & Plan Note (Signed)
He has fatigue. He has a history of anemia. We will recheck his CBC. He does not have symptoms to support a diagnosis of sleep apnea.

## 2013-02-05 NOTE — Progress Notes (Signed)
Patient Care Team: Jacques Navy, MD as PCP - General (Internal Medicine) Jacques Navy, MD as Attending Physician (Internal Medicine)   HPI  Tyler Sims is a 57 y.o. male Seen in followup for nonischemic cardiomyopathy with which he presented with a stroke and atrial fibrillation. It was initially hoped that this was secondary to the rate. However, despite restoration of sinus rhythm, he has had persistent left ventricular dysfunction.  Eection fraction remains below 25-30%. Echo sept 2012 40%  The patient denies chest pain, shortness of breath, nocturnal dyspnea, orthopnea or peripheral edema.  There is a sense of fatigue however that is new and turning. He wonders whether he is in atrial fibrillation  Blood work was reviewed. His last hemoglobin was one year ago as well. A year before that it had been15.  Past Medical History  Diagnosis Date  . Stroke 08/24/2010  . Systolic CHF   . Persistent atrial fibrillation 08/24/2010  . NICM (nonischemic cardiomyopathy)   . Bradycardia     nocturnal  . Ventricular tachycardia, non-sustained   . Mitral regurgitation   . Varicella   . Mumps   . Measles (roseola)   . Abnormal transaminases   . Acid reflux   . Hypertension   . Colon polyps 02/07/2012    Dr. Stan Head    Past Surgical History  Procedure Laterality Date  . Cardiac catheterization    . Colonoscopy w/ biopsies  02/07/2012    Dr. Stan Head  . Cardioversion  08/15/2012    Procedure: CARDIOVERSION;  Surgeon: Duke Salvia, MD;  Location: [facility] ENDOSCOPY;  Service: Cardiovascular;  Laterality: N/A;    Current Outpatient Prescriptions  Medication Sig Dispense Refill  . famotidine (PEPCID) 20 MG tablet Take 20 mg by mouth daily.      Marland Kitchen losartan (COZAAR) 25 MG tablet Take 1 tablet (25 mg total) by mouth daily.  90 tablet  3  . metoprolol succinate (TOPROL-XL) 50 MG 24 hr tablet Take 1 tablet (50 mg total) by mouth daily.  15 tablet  6  . pravastatin (PRAVACHOL) 80  MG tablet Take one tablet by mouth twice a week  30 tablet  5  . spironolactone (ALDACTONE) 25 MG tablet Take 1/2 Tablet daily      . warfarin (COUMADIN) 5 MG tablet Take 1 tablet (5 mg total) by mouth as directed.  45 tablet  3  . warfarin (COUMADIN) 5 MG tablet TAKE AS DIRECTED BY ANTICOAGULATION CLINIC  45 tablet  3  . [DISCONTINUED] rosuvastatin (CRESTOR) 5 MG tablet Take one tablet by mouth twice a week.       No current facility-administered medications for this visit.    Allergies  Allergen Reactions  . Lisinopril Rash  . Simvastatin Rash    Review of Systems negative except from HPI and PMH  Physical Exam BP 140/70  Pulse 62  Ht 6\' 3"  (1.905 m)  Wt 203 lb 12.8 oz (92.443 kg)  BMI 25.47 kg/m2 Well developed and well nourished in no acute distress HENT normal E scleral and icterus clear Neck Supple JVP flat; carotids brisk and full Clear to ausculation  Regular rate and rhythm, no murmurs gallops or rub Soft with active bowel sounds No clubbing cyanosis none Edema Alert and oriented, grossly normal motor and sensory function Skin Warm and Dry  Electrocardiogram demonstrates sinus rhythm at 62 intervals 18/08/43 Occasional PVC RSR prime  Assessment and  Plan

## 2013-02-06 ENCOUNTER — Telehealth: Payer: Self-pay | Admitting: *Deleted

## 2013-02-06 DIAGNOSIS — I4891 Unspecified atrial fibrillation: Secondary | ICD-10-CM

## 2013-02-06 NOTE — Telephone Encounter (Signed)
Patient calling to have coumadin switched to Xarelto. This was talked about with Dr Graciela Husbands in yesterday's office visit (02/05/13 10:30am) about possibly switching from coumadin. I let patient know that I will forward this message to his nurse for more instructions b/c we must get approval and dosage from Dr Graciela Husbands to start thiis change. He agreed and asked for [place] to call his cell phone [phone] when this decision made.    Micki Riley, CMA

## 2013-02-06 NOTE — Telephone Encounter (Signed)
Spoke with pt, he will need to come by the office for bmp. Per kim in the CVRR clinic pt instructed to take his last dose of warfarin Saturday. They will check his INR wed and based on that result and the result of his bmp he will be able to make the change. Pt voiced understanding.

## 2013-02-08 ENCOUNTER — Other Ambulatory Visit (INDEPENDENT_AMBULATORY_CARE_PROVIDER_SITE_OTHER): Payer: Managed Care, Other (non HMO)

## 2013-02-08 DIAGNOSIS — I4891 Unspecified atrial fibrillation: Secondary | ICD-10-CM

## 2013-02-08 LAB — BASIC METABOLIC PANEL
BUN: 15 mg/dL (ref 6–23)
CO2: 27 mEq/L (ref 19–32)
Chloride: 104 mEq/L (ref 96–112)
Creatinine, Ser: 0.9 mg/dL (ref 0.4–1.5)
Glucose, Bld: 98 mg/dL (ref 70–99)

## 2013-02-13 ENCOUNTER — Other Ambulatory Visit: Payer: Self-pay | Admitting: General Practice

## 2013-02-13 ENCOUNTER — Ambulatory Visit (INDEPENDENT_AMBULATORY_CARE_PROVIDER_SITE_OTHER): Payer: Managed Care, Other (non HMO) | Admitting: General Practice

## 2013-02-13 DIAGNOSIS — Z7901 Long term (current) use of anticoagulants: Secondary | ICD-10-CM

## 2013-02-13 DIAGNOSIS — I6350 Cerebral infarction due to unspecified occlusion or stenosis of unspecified cerebral artery: Secondary | ICD-10-CM

## 2013-02-13 DIAGNOSIS — I639 Cerebral infarction, unspecified: Secondary | ICD-10-CM

## 2013-02-13 DIAGNOSIS — I4891 Unspecified atrial fibrillation: Secondary | ICD-10-CM

## 2013-02-13 MED ORDER — RIVAROXABAN 20 MG PO TABS: 20.0000 mg | ORAL_TABLET | Freq: Every day | ORAL | Status: DC

## 2013-02-19 ENCOUNTER — Telehealth: Payer: Self-pay | Admitting: *Deleted

## 2013-02-19 NOTE — Telephone Encounter (Signed)
Pt informed of MD's advisement-appointment scheduled for 02/26/2013 at 4:15pm.

## 2013-02-19 NOTE — Telephone Encounter (Signed)
Last ov Jan '14. Need to know the heart rate(s). If too slow can consider making change in dose or in medication. OV would also be appropriate for follow up.

## 2013-02-19 NOTE — Telephone Encounter (Signed)
Pt's spouse called to asking MD's advisement on whether pt's Metoprolol dosage needs to be changed. Pt is very tired all of the time and she thinks the Metoprolol may be slowing his heart rate down too much. Pt has recently started taking Xarelto-previously was on Coumadin.

## 2013-02-26 ENCOUNTER — Ambulatory Visit: Payer: Managed Care, Other (non HMO) | Admitting: Internal Medicine

## 2013-02-26 ENCOUNTER — Encounter: Payer: Self-pay | Admitting: Neurology

## 2013-02-26 ENCOUNTER — Ambulatory Visit (INDEPENDENT_AMBULATORY_CARE_PROVIDER_SITE_OTHER): Payer: Managed Care, Other (non HMO) | Admitting: Neurology

## 2013-02-26 VITALS — BP 149/80 | HR 60 | Temp 97.5°F | Ht 73.0 in | Wt 203.0 lb

## 2013-02-26 DIAGNOSIS — I6350 Cerebral infarction due to unspecified occlusion or stenosis of unspecified cerebral artery: Secondary | ICD-10-CM

## 2013-02-26 DIAGNOSIS — I4891 Unspecified atrial fibrillation: Secondary | ICD-10-CM | POA: Insufficient documentation

## 2013-02-26 DIAGNOSIS — I639 Cerebral infarction, unspecified: Secondary | ICD-10-CM

## 2013-02-26 MED ORDER — ROSUVASTATIN CALCIUM 10 MG PO TABS: 10.0000 mg | ORAL_TABLET | Freq: Every day | ORAL | Status: DC

## 2013-02-26 NOTE — Patient Instructions (Addendum)
We will order Carotid Doppler Study. Stop Pravachol. Replace with Crestor 10mg  daily and CoQ10 200mg  daily.  Follow up visit in 6 months.  STROKE/TIA INSTRUCTIONS SMOKING Cigarette smoking nearly doubles your risk of having a stroke & is the single most alterable risk factor  If you smoke or have smoked in the last 12 months, you are advised to quit smoking for your health.  Most of the excess cardiovascular risk related to smoking disappears within a year of stopping.  Ask you doctor about anti-smoking medications  [place] Quit Line: 1-800-QUIT NOW  Free Smoking Cessation Classes ([phone]  CHOLESTEROL Know your levels; limit fat & cholesterol in your diet  Lab Results  Component Value Date   CHOL 201* 10/03/2012   HDL 63.50 10/03/2012   LDLCALC 91 01/24/2012   LDLDIRECT 110.4 10/03/2012   TRIG 75.0 10/03/2012   CHOLHDL 3 10/03/2012      Many patients benefit from treatment even if their cholesterol is at goal.  Goal: Total Cholesterol less than 160  Goal:  LDL less than 100  Goal:  HDL greater than 40  Goal:  Triglycerides less than 150  BLOOD PRESSURE American Stroke Association blood pressure target is less that 120/80 mm/Hg  Your discharge blood pressure is:  BP: 149/80 mmHg  Monitor your blood pressure  Limit your salt and alcohol intake  Many individuals will require more than one medication for high blood pressure  DIABETES (A1c is a blood sugar average for last 3 months) Goal A1c is under 7% (A1c is blood sugar average for last 3 months)  Diabetes: No known diagnosis of diabetes    Lab Results  Component Value Date   HGBA1C  Value: 4.7 (NOTE)                                                                       According to the ADA Clinical Practice Recommendations for 2011, when HbA1c is used as a screening test:   >=6.5%   Diagnostic of Diabetes Mellitus           (if abnormal result  is confirmed)  5.7-6.4%   Increased risk of developing Diabetes Mellitus   References:Diagnosis and Classification of Diabetes Mellitus,Diabetes Care,2011,34(Suppl 1):S62-S69 and Standards of Medical Care in         Diabetes - 2011,Diabetes Care,2011,34  (Suppl 1):S11-S61. 08/28/2010    Your A1c can be lowered with medications, healthy diet, and exercise.  Check your blood sugar as directed by your physician  Call your physician if you experience unexplained or low blood sugars.  PHYSICAL ACTIVITY/REHABILITATION Goal is 30 minutes at least 4 days per week    Activity decreases your risk of heart attack and stroke and makes your heart stronger.  It helps control your weight and blood pressure; helps you relax and can improve your mood.  Participate in a regular exercise program.  Talk with your doctor about the best form of exercise for you (dancing, walking, swimming, cycling).  DIET/WEIGHT Goal is to maintain a healthy weight  Your height is:  Height: 6\' 1"  (185.4 cm) Your current weight is: Weight: 203 lb (92.08 kg) Your body Mass Index (BMI) is:  BMI (Calculated): 26.8  Following the type of diet  specifically designed for you will help prevent another stroke.  Your goal Body Mass Index (BMI) is 19-24.  Healthy food habits can help reduce 3 risk factors for stroke:  High cholesterol, hypertension, and excess weight.

## 2013-02-26 NOTE — Progress Notes (Signed)
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 Patient is in quite well with near complete recovery and full functional independence.  Returns for followup since last visit on 03/15/2012. He has noticed right knee pain with some numbness to great toe and second, third toe in the last month. He does work standing for long periods. Denies back pain, tingling or weakness. Continues warfarin tolerating well, has had mild fluctuations of his INR. He also had cardioversion for atrial fibrillation in which he is converted to a regular rhythm on 08/15/2012. Blood pressure at home ranges 120s to 130s systolic; today in office 168/86. Labs done, his LDLs 91, total cholesterol 174, next appointment with PCP January 2014. He has had a 5 pound weight gain since his last visit.   04/05/2012 transcranial Doppler is consistent with signs of mild stenosis involving the proximal EA. An abnormally elevated CBFV's  in the right/left M1s, A1s, C1s and proximal BA, possibly due to minor stenotic narrowing of these vessels.   04/05/2012 carotid Doppler showed an abnormally elevated blood flow velocities seen in the right/left proximal CCAs and consistent with 50-69% stenosis of these vessels, but this finding is not supported by presence of plaques.   02/26/2013  Patient here for routine followup. States he's doing well since last visit and has recently been changed from Coumadin to Xarelto by cardiologist Sherryl Manges.  Recent cholesterol test elevated LDL 110, total 210, PCP increased  Pravachol to 80mg .  He c/o leg muscle/joint aches, and PCP is having him hold statin x 1 month to see if it makes a difference.  It has been 2 weeks with no change.  Patient states his BP's usually run 120-130's systolic, is 149/80 in office today.  Patient is not diabetic.  Patient denies medication side effects, with no signs of bleeding.  No new neurological symptoms.  REVIEW OF SYSTEMS: Full 14 system review of systems performed and notable only for: constitutional: Fatigue  musculoskeletal: Joint pain, aching muscles  neurological: numbness sleep: Sleepiness  psychiatric: Decreased energy   ALLERGIES: Allergies  Allergen Reactions  . Lisinopril Rash  . Simvastatin Rash    HOME MEDICATIONS: Outpatient Prescriptions Prior to Visit  Medication Sig Dispense Refill  . famotidine (PEPCID) 20 MG tablet Take 20 mg by mouth daily.      Marland Kitchen losartan (COZAAR) 25 MG tablet Take 1 tablet (25 mg total) by mouth daily.  90 tablet  3  . metoprolol succinate (TOPROL-XL) 50 MG 24 hr tablet Take 1 tablet (50 mg total) by mouth daily.  15 tablet  6  . Rivaroxaban (XARELTO) 20 MG TABS Take 1 tablet (20 mg total) by mouth daily with supper.  30 tablet  2  . spironolactone (ALDACTONE) 25 MG tablet Take 1/2 Tablet daily      . pravastatin (PRAVACHOL) 80 MG tablet Take one tablet by mouth twice a week  30 tablet  5  . warfarin (COUMADIN) 5 MG tablet Take 1 tablet (5 mg total) by mouth as directed.  45 tablet  3  . warfarin (COUMADIN) 5 MG tablet TAKE AS DIRECTED BY  ANTICOAGULATION CLINIC  45 tablet  3   No facility-administered medications prior to visit.    PAST MEDICAL HISTORY: Past Medical History  Diagnosis Date  . Stroke 08/24/2010  . Systolic CHF   . Persistent atrial fibrillation 08/24/2010  . NICM (nonischemic cardiomyopathy)   . Bradycardia     nocturnal  . Ventricular tachycardia, non-sustained   . Mitral regurgitation   . Varicella   . Mumps   . Measles (roseola)   . Abnormal  transaminases   . Acid reflux   . Hypertension   . Colon polyps 02/07/2012    Dr. Stan Head    PAST SURGICAL HISTORY: Past Surgical History  Procedure Laterality Date  . Cardiac catheterization    . Colonoscopy w/ biopsies  02/07/2012    Dr. Stan Head  . Cardioversion  08/15/2012    Procedure: CARDIOVERSION;  Surgeon: Duke Salvia, MD;  Location: [facility] ENDOSCOPY;  Service: Cardiovascular;  Laterality: N/A;    FAMILY HISTORY: Family History  Problem Relation Age of Onset  . Alzheimer's disease Mother   . Lung cancer Brother     died from  . Arrhythmia Brother   . Hyperlipidemia Brother   . Hypertension Sister     X78  . Heart disease Father     CAD/MI  . Hypertension Father   . Mental illness Father     h/o depression requiring hospitalization  . Hyperlipidemia Sister   . Hypertension Brother     X3  . Heart disease Brother     cardiomyopathy, a. fib  . Colon polyps Brother     SOCIAL HISTORY: History   Social History  . Marital Status: Married    Spouse Name: N/A    Number of Children: 2  . Years of Education: 16   Occupational History  . Energy manager   .     Social History Main Topics  . Smoking status: Never Smoker   . Smokeless tobacco: Never Used  . Alcohol Use: No  . Drug Use: No  . Sexually Active: Yes -- Male partner(s)   Other Topics Concern  . Not on file   Social History Narrative   HS[employer], [employer]. Married '82 - 2 dtrs '85, '91. 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      PHYSICAL EXAM  Filed Vitals:   02/26/13 1624  BP: 149/80  Pulse: 60  Temp: 97.5 F (36.4 C)  TempSrc: Oral  Height: 6\' 1"  (1.854 m)  Weight: 203 lb (92.08 kg)   Body mass index is 26.79 kg/(m^2).  GENERAL EXAM: Patient is in no distress, well developed and well groomed. HEAD: Symmetric facial features. EARS, NOSE, and THROAT: Normal.  NECK: Supple, no JVD RESPIRATORY: Lungs CTA. CARDIOVASCULAR: Regular rate and  rhythm, no murmurs, no carotid bruits SKIN: No rash, no bruising  NEUROLOGIC: MENTAL STATUS: awake, alert and oriented to person, place and time, language fluent, comprehension intact, naming intact CRANIAL NERVE:  pupils equal and reactive to light, visual fields full to confrontation, extraocular muscles intact, no nystagmus, facial sensation and strength symmetric, uvula midline, shoulder shrug symmetric, tongue midline. MOTOR: normal bulk and tone, full strength in the BUE, BLE,  fine finger movements normal SENSORY: normal and symmetric to light touch, pinprick, temperature, vibration  COORDINATION: finger-nose-finger and heel-shin normal REFLEXES: deep tendon reflexes present and symmetric 2+,  GAIT/STATION: narrow based gait; able to walk on toes, heels and tandem; romberg is negative. No assistive device.  DIAGNOSTIC DATA (LABS, IMAGING, TESTING) - I reviewed patient records, labs, notes, testing and imaging myself where available.  Lab Results  Component Value Date   WBC 5.8 02/05/2013   HGB 14.8 02/05/2013   HCT 42.2 02/05/2013   MCV 93.9 02/05/2013   PLT 194.0 02/05/2013      Component Value Date/Time   NA 137 02/08/2013 0839   K 4.1 02/08/2013 0839   CL 104 02/08/2013 0839   CO2 27 02/08/2013 0839   GLUCOSE 98 02/08/2013 0839   BUN 15 02/08/2013 0839   CREATININE 0.9 02/08/2013 0839   CALCIUM 9.2 02/08/2013 0839   PROT 8.5* 10/03/2012 1440   PROT 8.5* 10/03/2012 1440   ALBUMIN 4.4 10/03/2012 1440   ALBUMIN 4.4 10/03/2012 1440   AST 28 10/03/2012 1440   AST 28 10/03/2012 1440   ALT 23 10/03/2012 1440   ALT 23 10/03/2012 1440   ALKPHOS 58 10/03/2012 1440   ALKPHOS 58 10/03/2012 1440   BILITOT 1.4* 10/03/2012 1440   BILITOT 1.4* 10/03/2012 1440   GFRNONAA >60 10/28/2010 1047   GFRAA  Value: >60        The eGFR has been calculated using the MDRD equation. This calculation has not been validated in all clinical situations. eGFR's persistently <60 mL/min signify possible Chronic Kidney  Disease. 10/28/2010 1047   Lab Results  Component Value Date   CHOL 201* 10/03/2012   HDL 63.50 10/03/2012   LDLCALC 91 01/24/2012   LDLDIRECT 110.4 10/03/2012   TRIG 75.0 10/03/2012   CHOLHDL 3 10/03/2012   Lab Results  Component Value Date   HGBA1C  Value: 4.7 (NOTE)                                                                       According to the ADA Clinical Practice Recommendations for 2011, when HbA1c is used as a screening test:   >=6.5%   Diagnostic of Diabetes Mellitus           (if abnormal result  is confirmed)  5.7-6.4%   Increased risk of developing Diabetes Mellitus  References:Diagnosis and Classification of Diabetes Mellitus,Diabetes Care,2011,34(Suppl 1):S62-S69 and Standards of Medical Care in         Diabetes - 2011,Diabetes Care,2011,34  (Suppl 1):S11-S61. 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 Vascular risk factors include atrial fibrillation, hypertension, carotid artery disease, and hyperlipidemia. Complains of muscle aches in both legs, on advice of Cardiology he has stopped taking statin (Pravachol 80mg ) for 1 month, it has been 2 weeks already, with no difference.  We will stop Pravachol and start Crestor 10mg  with daily CoQ10.  Continue Xarelto 20 mg  for secondary stroke prevention and maintain strict control of hypertension with blood pressure goal below 130/90, diabetes with hemoglobin A1c goal below 6.5% and lipids with LDL cholesterol goal below 100 mg/dL.  Stop Pravachol due to muscle aches.  Replace with Crestor 10mg  daily and CoQ10 200mg  daily. We will order  Carotid dopplers today. Lipid panel to be checked every 6 months. Followup in 6 months.   LYNN LAM NP-C 02/26/2013, 5:04 PM  [facility] [address], [address] [place], [place] [address] [phone]  I have personally examined this patient, reviewed pertinent data, developed plan of care and discussed with patient and agree with above. Delia Heady, MD

## 2013-03-12 ENCOUNTER — Ambulatory Visit (INDEPENDENT_AMBULATORY_CARE_PROVIDER_SITE_OTHER): Payer: Managed Care, Other (non HMO)

## 2013-03-12 DIAGNOSIS — I6350 Cerebral infarction due to unspecified occlusion or stenosis of unspecified cerebral artery: Secondary | ICD-10-CM

## 2013-03-12 DIAGNOSIS — I639 Cerebral infarction, unspecified: Secondary | ICD-10-CM

## 2013-03-14 ENCOUNTER — Telehealth: Payer: Self-pay | Admitting: Nurse Practitioner

## 2013-03-14 NOTE — Telephone Encounter (Signed)
Called and left message at Mr. Bethel's home number that his Carotid doppler study was normal.

## 2013-03-22 ENCOUNTER — Other Ambulatory Visit: Payer: Self-pay | Admitting: Internal Medicine

## 2013-04-01 ENCOUNTER — Telehealth: Payer: Self-pay | Admitting: *Deleted

## 2013-04-01 NOTE — Telephone Encounter (Signed)
Stop Xarelto. Contact Dr. Odessa Fleming office about this problem.

## 2013-04-01 NOTE — Telephone Encounter (Signed)
Spoke with pt advised him of MD message.

## 2013-04-01 NOTE — Telephone Encounter (Signed)
Pt called states he has began experiencing gum bleeding through out the night.  He states it is getting progressively worse.  Pt further states this started since he began the Xarelto 20mg .  Please advise pt

## 2013-04-02 ENCOUNTER — Telehealth: Payer: Self-pay | Admitting: Internal Medicine

## 2013-04-02 NOTE — Telephone Encounter (Signed)
Called patient back. He states that he wakes up with blood in his mouth. Uses a soft toothbrush and has some dental problems (needs some teeth pulled) Does not use dental floss and not sure of the state of his gums. Has an appointment with his dentist in September. Patient states that he had no problems with bleeding when he was taking Coumadin 5mg . He would like to change back to Coumadin and have monitored at Dr.Norins office. Advised will ask Dr.Klein for directions on switching back and call him back.  IE amount of days of crossover or need for Lovenox due to history of stroke.

## 2013-04-02 NOTE — Telephone Encounter (Signed)
New Problem  Pt states that his gums at night have started bleeding a lot since he has started taking Xarelto.  He said he was advised by his PCP to give [place] a call.

## 2013-04-02 NOTE — Telephone Encounter (Signed)
Per Dr. Graciela Husbands, the patient will need Xarelto + Coumadin for 2 days, then Lovenox + Coumadin for 2 days, then an INR check. Will forward to [facility] to help with dosing of lovenox.

## 2013-04-03 MED ORDER — ENOXAPARIN SODIUM 100 MG/ML ~~LOC~~ SOLN: 100.0000 mg | Freq: Two times a day (BID) | SUBCUTANEOUS | Status: DC

## 2013-04-03 NOTE — Telephone Encounter (Signed)
Spoke with pt.  He stopped Xarelto 2 days ago per Dr. Jodelle Green instructions.  Will go ahead and just start Lovenox bridge today.  Weight- 92 kg; SCr- 0.9  7/16- Lovenox 100mg  in PM and Warfarin 7.5mg   7/17- Lovenox 100mg  in AM and PM and warfarin 7.5mg   7/18- Lovenox 100mg  in AM and PM and warfarin 7.5mg   7/19- Lovenox 100mg  in AM and PM and warfarin 5mg   7/20- Lovenox 100mg  in AM and PM and warfarin 5mg   7/21- Check INR   Appt made at [facility] st clinic on 7/21 but will follow up at [facility] office thereafter.

## 2013-04-03 NOTE — Telephone Encounter (Signed)
°  Follow Up ° °Pt calling following up on call from yesterday. Please call. °

## 2013-04-08 ENCOUNTER — Ambulatory Visit (INDEPENDENT_AMBULATORY_CARE_PROVIDER_SITE_OTHER): Payer: Managed Care, Other (non HMO) | Admitting: *Deleted

## 2013-04-08 DIAGNOSIS — I6350 Cerebral infarction due to unspecified occlusion or stenosis of unspecified cerebral artery: Secondary | ICD-10-CM

## 2013-04-08 DIAGNOSIS — Z7901 Long term (current) use of anticoagulants: Secondary | ICD-10-CM | POA: Insufficient documentation

## 2013-04-08 DIAGNOSIS — I4891 Unspecified atrial fibrillation: Secondary | ICD-10-CM

## 2013-04-08 DIAGNOSIS — I639 Cerebral infarction, unspecified: Secondary | ICD-10-CM

## 2013-04-08 LAB — POCT INR: INR: 1.9

## 2013-04-16 ENCOUNTER — Ambulatory Visit (INDEPENDENT_AMBULATORY_CARE_PROVIDER_SITE_OTHER): Payer: Managed Care, Other (non HMO) | Admitting: General Practice

## 2013-04-16 DIAGNOSIS — Z7901 Long term (current) use of anticoagulants: Secondary | ICD-10-CM

## 2013-04-16 DIAGNOSIS — I639 Cerebral infarction, unspecified: Secondary | ICD-10-CM

## 2013-04-16 DIAGNOSIS — I6350 Cerebral infarction due to unspecified occlusion or stenosis of unspecified cerebral artery: Secondary | ICD-10-CM

## 2013-04-16 DIAGNOSIS — I4891 Unspecified atrial fibrillation: Secondary | ICD-10-CM

## 2013-05-14 ENCOUNTER — Ambulatory Visit (INDEPENDENT_AMBULATORY_CARE_PROVIDER_SITE_OTHER): Payer: Managed Care, Other (non HMO) | Admitting: General Practice

## 2013-05-14 ENCOUNTER — Other Ambulatory Visit: Payer: Self-pay | Admitting: Cardiology

## 2013-05-14 DIAGNOSIS — I6350 Cerebral infarction due to unspecified occlusion or stenosis of unspecified cerebral artery: Secondary | ICD-10-CM

## 2013-05-14 DIAGNOSIS — I4891 Unspecified atrial fibrillation: Secondary | ICD-10-CM

## 2013-05-14 DIAGNOSIS — Z7901 Long term (current) use of anticoagulants: Secondary | ICD-10-CM

## 2013-05-14 DIAGNOSIS — I639 Cerebral infarction, unspecified: Secondary | ICD-10-CM

## 2013-05-28 ENCOUNTER — Ambulatory Visit (INDEPENDENT_AMBULATORY_CARE_PROVIDER_SITE_OTHER): Payer: Managed Care, Other (non HMO)

## 2013-05-28 DIAGNOSIS — Z23 Encounter for immunization: Secondary | ICD-10-CM

## 2013-06-11 ENCOUNTER — Ambulatory Visit (INDEPENDENT_AMBULATORY_CARE_PROVIDER_SITE_OTHER): Payer: Managed Care, Other (non HMO) | Admitting: General Practice

## 2013-06-11 DIAGNOSIS — Z7901 Long term (current) use of anticoagulants: Secondary | ICD-10-CM

## 2013-06-11 DIAGNOSIS — I639 Cerebral infarction, unspecified: Secondary | ICD-10-CM

## 2013-06-11 DIAGNOSIS — I4891 Unspecified atrial fibrillation: Secondary | ICD-10-CM

## 2013-06-11 DIAGNOSIS — I6350 Cerebral infarction due to unspecified occlusion or stenosis of unspecified cerebral artery: Secondary | ICD-10-CM

## 2013-06-16 ENCOUNTER — Other Ambulatory Visit: Payer: Self-pay | Admitting: Internal Medicine

## 2013-06-17 ENCOUNTER — Other Ambulatory Visit: Payer: Self-pay | Admitting: General Practice

## 2013-06-17 MED ORDER — WARFARIN SODIUM 5 MG PO TABS: ORAL_TABLET | ORAL | Status: DC

## 2013-06-17 NOTE — Telephone Encounter (Signed)
Arline Asp, Please review this refill, thanks, Addison Lank, RN Patient Advocate

## 2013-06-25 ENCOUNTER — Other Ambulatory Visit: Payer: Self-pay | Admitting: Internal Medicine

## 2013-07-16 ENCOUNTER — Ambulatory Visit (INDEPENDENT_AMBULATORY_CARE_PROVIDER_SITE_OTHER): Payer: Managed Care, Other (non HMO) | Admitting: General Practice

## 2013-07-16 DIAGNOSIS — I4891 Unspecified atrial fibrillation: Secondary | ICD-10-CM

## 2013-07-16 DIAGNOSIS — I639 Cerebral infarction, unspecified: Secondary | ICD-10-CM

## 2013-07-16 DIAGNOSIS — Z7901 Long term (current) use of anticoagulants: Secondary | ICD-10-CM

## 2013-07-16 DIAGNOSIS — I6350 Cerebral infarction due to unspecified occlusion or stenosis of unspecified cerebral artery: Secondary | ICD-10-CM

## 2013-08-13 ENCOUNTER — Ambulatory Visit (INDEPENDENT_AMBULATORY_CARE_PROVIDER_SITE_OTHER): Payer: Managed Care, Other (non HMO) | Admitting: General Practice

## 2013-08-13 DIAGNOSIS — I4891 Unspecified atrial fibrillation: Secondary | ICD-10-CM

## 2013-08-13 DIAGNOSIS — Z7901 Long term (current) use of anticoagulants: Secondary | ICD-10-CM

## 2013-08-13 DIAGNOSIS — I6350 Cerebral infarction due to unspecified occlusion or stenosis of unspecified cerebral artery: Secondary | ICD-10-CM

## 2013-08-13 DIAGNOSIS — I639 Cerebral infarction, unspecified: Secondary | ICD-10-CM

## 2013-08-13 LAB — POCT INR: INR: 2.1

## 2013-08-13 NOTE — Progress Notes (Signed)
Pre-visit discussion using our clinic review tool. No additional management support is needed unless otherwise documented below in the visit note.  

## 2013-08-20 ENCOUNTER — Ambulatory Visit: Payer: Managed Care, Other (non HMO) | Admitting: Nurse Practitioner

## 2013-08-22 ENCOUNTER — Encounter: Payer: Self-pay | Admitting: Nurse Practitioner

## 2013-08-22 ENCOUNTER — Encounter (INDEPENDENT_AMBULATORY_CARE_PROVIDER_SITE_OTHER): Payer: Self-pay

## 2013-08-22 ENCOUNTER — Ambulatory Visit (INDEPENDENT_AMBULATORY_CARE_PROVIDER_SITE_OTHER): Payer: Managed Care, Other (non HMO) | Admitting: Nurse Practitioner

## 2013-08-22 VITALS — BP 136/77 | HR 56 | Temp 98.4°F | Ht 73.0 in | Wt 205.0 lb

## 2013-08-22 DIAGNOSIS — G609 Hereditary and idiopathic neuropathy, unspecified: Secondary | ICD-10-CM

## 2013-08-22 DIAGNOSIS — I6350 Cerebral infarction due to unspecified occlusion or stenosis of unspecified cerebral artery: Secondary | ICD-10-CM

## 2013-08-22 NOTE — Patient Instructions (Addendum)
Continue Coumadin for atrial fibrillation and secondary stroke prevention and maintain strict control of hypertension with blood pressure goal below 130/90, diabetes with hemoglobin A1c goal below 6.5% and lipids with LDL cholesterol goal below 100 mg/dL.   Continue Crestor 10mg  daily and CoQ10 200mg  daily.  Lipid panel to be checked every 6 months.  Followup in 6 months, sooner as needed.  Contact the office if you like to try Gabapentin for the burning sensation in your legs.

## 2013-08-22 NOTE — Progress Notes (Signed)
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 Patient is in quite well with near complete recovery and full functional independence.  Returns for followup since last visit on 03/15/2012. He has noticed right knee pain with some numbness to great toe and second, third toe in the last month. He does work standing for long periods. Denies back pain, tingling or weakness. Continues warfarin tolerating well, has had mild fluctuations of his INR. He also had cardioversion for atrial fibrillation in which he is converted to a regular rhythm on 08/15/2012. Blood pressure at home ranges 120s to 130s systolic; today in office 168/86. Labs done, his LDLs 91, total cholesterol 174, next appointment with PCP January 2014. He has had a 5 pound weight gain since his last visit.     02/26/2013 Patient here for routine followup. States he's doing well since last visit and has recently been changed from Coumadin to Xarelto by cardiologist Sherryl Manges. Recent cholesterol test elevated LDL 110, total 210, PCP increased Pravachol to 80mg . He c/o leg muscle/joint aches, and PCP is having him hold statin x 1 month to see if it makes a difference. It has been 2 weeks with no change. Patient states his BP's usually run 120-130's systolic, is 149/80 in office today. Patient is not diabetic. Patient denies medication side effects, with no signs of bleeding. No new neurological symptoms.  08/22/13 (LL): Patient returns for stroke follow up. Carotid Doppler on 03/12/13 was negative.  He states that BP is well controlled, in office today is  136/77.  He started having frequent bleeding of his gums and was switched from Xarelto back to Coumadin.  Patient denies medication side effects, with no signs of bleeding. No new neurological symptoms, but still complains of burning/sore sensation in his lower legs and feet.  He does not want to try any medication for it at this time.  REVIEW OF SYSTEMS: Full 14 system review of systems performed and notable only for:  Constitutional: fatigue  Cardiovascular: chest pain (dr. Graciela Husbands aware) Musculoskeletal: legs and feet feel hot, knee joint pain Sleep: restless legs, sleepiness   ALLERGIES: Allergies  Allergen Reactions  . Lisinopril Rash  . Simvastatin Rash    HOME MEDICATIONS: Outpatient Prescriptions Prior to Visit  Medication Sig Dispense Refill  . COD LIVER OIL PO Take by mouth daily.      Marland Kitchen losartan (COZAAR) 25 MG tablet Take 1 tablet (25 mg total) by mouth daily.  90 tablet  3  . metoprolol succinate (TOPROL-XL) 50 MG 24 hr tablet TAKE 1 TABLET (50 MG TOTAL) BY MOUTH DAILY.  30 tablet  3  . rosuvastatin (CRESTOR) 10 MG tablet Take 1 tablet (10 mg total) by mouth at bedtime.  30 tablet  12  . spironolactone (ALDACTONE) 25 MG tablet Take 1/2 Tablet daily      . warfarin (COUMADIN) 5 MG tablet Take as directed by anticoagulation clinic  40 tablet  2  . enoxaparin (LOVENOX) 100 MG/ML injection Inject 1 mL (100 mg total) into the skin every 12 (twelve) hours.  10 Syringe  0  . famotidine (PEPCID) 20 MG tablet Take 20  mg by mouth daily.      Marland Kitchen spironolactone (ALDACTONE) 25 MG tablet TAKE 1/2 TABLET BY MOUTH DAILY  30 tablet  5   No facility-administered medications prior to visit.    PAST MEDICAL HISTORY: Past Medical History  Diagnosis Date  . Stroke 08/24/2010  . Systolic CHF   . Persistent atrial fibrillation 08/24/2010  . NICM (nonischemic cardiomyopathy)   . Bradycardia     nocturnal  . Ventricular tachycardia, non-sustained   . Mitral regurgitation   . Varicella   .  Mumps   . Measles (roseola)   . Abnormal transaminases   . Acid reflux   . Hypertension   . Colon polyps 02/07/2012    Dr. Stan Head    PAST SURGICAL HISTORY: Past Surgical History  Procedure Laterality Date  . Cardiac catheterization    . Colonoscopy w/ biopsies  02/07/2012    Dr. Stan Head  . Cardioversion  08/15/2012    Procedure: CARDIOVERSION;  Surgeon: Duke Salvia, MD;  Location: [facility]COPY;  Service: Cardiovascular;  Laterality: N/A;    FAMILY HISTORY: Family History  Problem Relation Age of Onset  . Alzheimer's disease Mother   . Lung cancer Brother     died from  . Arrhythmia Brother   . Hyperlipidemia Brother   . Hypertension Sister     X20  . Heart disease Father     CAD/MI  . Hypertension Father   . Mental illness Father     h/o depression requiring hospitalization  . Hyperlipidemia Sister   . Hypertension Brother     X3  . Heart disease Brother     cardiomyopathy, a. fib  . Colon polyps Brother     SOCIAL HISTORY: History   Social History  . Marital Status: Married    Spouse Name: N/A    Number of Children: 2  . Years of Education: 16   Occupational History  . Energy manager   .     Social History Main Topics  . Smoking status: Never Smoker   . Smokeless tobacco: Never Used  . Alcohol Use: No  . Drug Use: No  . Sexual Activity: Yes    Partners: Female   Other Topics Concern  . Not on file   Social History Narrative   HSG, [place]. Married '82 - 2 dtrs '85, '91. 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      PHYSICAL EXAM  Filed Vitals:   08/22/13 1426  BP: 136/77  Pulse: 56  Temp: 98.4 F (36.9 C)  TempSrc: Oral  Height: 6\' 1"  (1.854 m)  Weight: 205 lb (92.987 kg)   Body mass index is 27.05 kg/(m^2).  GENERAL EXAM: Patient is in no distress, well developed and well groomed.  HEAD: Symmetric facial features.  EARS, NOSE, and THROAT: Normal.  NECK: Supple, no JVD  RESPIRATORY:  Lungs CTA.  CARDIOVASCULAR: Regular rate and rhythm, no murmurs, no carotid bruits  SKIN: No rash, no bruising   NEUROLOGIC:  MENTAL STATUS: awake, alert and oriented to person, place and time, language fluent, comprehension intact, naming intact  CRANIAL NERVE: pupils equal and reactive to light, visual fields full to confrontation, extraocular muscles intact, no nystagmus, facial sensation and strength symmetric, uvula midline, shoulder shrug symmetric, tongue midline.  MOTOR: normal bulk and tone, full strength in the BUE, BLE, fine finger movements normal  SENSORY: normal and symmetric to light touch, pinprick, temperature, vibration  COORDINATION: finger-nose-finger and  heel-shin normal  REFLEXES: deep tendon reflexes present and symmetric 2+,  GAIT/STATION: narrow based gait; able to walk on toes, heels and tandem; romberg is negative. No assistive device.   DIAGNOSTIC DATA (LABS, IMAGING, TESTING) - I reviewed patient records, labs, notes, testing and imaging myself where available.  Lab Results  Component Value Date   WBC 5.8 02/05/2013   HGB 14.8 02/05/2013   HCT 42.2 02/05/2013   MCV 93.9 02/05/2013   PLT 194.0 02/05/2013      Component Value Date/Time   NA 137 02/08/2013 0839   K 4.1 02/08/2013 0839   CL 104 02/08/2013 0839   CO2 27 02/08/2013 0839   GLUCOSE 98 02/08/2013 0839   BUN 15 02/08/2013 0839   CREATININE 0.9 02/08/2013 0839   CALCIUM 9.2 02/08/2013 0839   PROT 8.5* 10/03/2012 1440   PROT 8.5* 10/03/2012 1440   ALBUMIN 4.4 10/03/2012 1440   ALBUMIN 4.4 10/03/2012 1440   AST 28 10/03/2012 1440   AST 28 10/03/2012 1440   ALT 23 10/03/2012 1440   ALT 23 10/03/2012 1440   ALKPHOS 58 10/03/2012 1440   ALKPHOS 58 10/03/2012 1440   BILITOT 1.4* 10/03/2012 1440   BILITOT 1.4* 10/03/2012 1440   GFRNONAA >60 10/28/2010 1047   GFRAA  Value: >60         10/28/2010 1047   Lab Results  Component Value Date   CHOL 201* 10/03/2012   HDL 63.50 10/03/2012   LDLDIRECT 110.4 10/03/2012   TRIG  75.0 10/03/2012   CHOLHDL 3 10/03/2012   Lab Results  Component Value Date   ESRSEDRATE 18 02/05/2013    ASSESSMENT AND PLAN 57 year old African American male with 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 Vascular risk factors include atrial fibrillation, hypertension, carotid artery disease, and hyperlipidemia.   Complains of muscle aches/burning in both legs, had stopped taking statin (Pravachol 80mg ) for 1 month, after 3 weeks there was no difference. He was started on Crestor 10mg  with daily CoQ10.  If muscle aches/burning sensation becomes intolerable, he is to call office for trial of Gabapentin.  Continue Coumadin for atrial fibrillation and secondary stroke prevention and maintain strict control of hypertension with blood pressure goal below 130/90, diabetes with hemoglobin A1c goal below 6.5% and lipids with LDL cholesterol goal below 100 mg/dL.  Continue Crestor 10mg  daily and CoQ10 200mg  daily.  Patient was referred for the [facility] Pharmacogenetic Testing study. Followup in 6 months, sooner as needed.  Ronal Fear, MSN, NP-C 08/22/2013, 2:41 PM [facility] [address], [address] [place], [place] [address] [phone]  Note: This document was prepared with digital dictation and possible smart phrase technology. Any transcriptional errors that result from this process are unintentional.

## 2013-09-17 ENCOUNTER — Other Ambulatory Visit: Payer: Self-pay | Admitting: Cardiology

## 2013-09-22 ENCOUNTER — Other Ambulatory Visit: Payer: Self-pay | Admitting: Internal Medicine

## 2013-09-23 ENCOUNTER — Other Ambulatory Visit: Payer: Self-pay | Admitting: General Practice

## 2013-09-23 MED ORDER — WARFARIN SODIUM 5 MG PO TABS: ORAL_TABLET | ORAL | Status: DC

## 2013-09-24 ENCOUNTER — Ambulatory Visit (INDEPENDENT_AMBULATORY_CARE_PROVIDER_SITE_OTHER): Payer: Managed Care, Other (non HMO) | Admitting: General Practice

## 2013-09-24 DIAGNOSIS — I639 Cerebral infarction, unspecified: Secondary | ICD-10-CM

## 2013-09-24 DIAGNOSIS — Z7901 Long term (current) use of anticoagulants: Secondary | ICD-10-CM

## 2013-09-24 DIAGNOSIS — I6350 Cerebral infarction due to unspecified occlusion or stenosis of unspecified cerebral artery: Secondary | ICD-10-CM

## 2013-09-24 DIAGNOSIS — I4891 Unspecified atrial fibrillation: Secondary | ICD-10-CM

## 2013-09-24 LAB — POCT INR: INR: 2

## 2013-09-24 NOTE — Progress Notes (Signed)
Pre-visit discussion using our clinic review tool. No additional management support is needed unless otherwise documented below in the visit note.  

## 2013-10-03 ENCOUNTER — Telehealth: Payer: Self-pay | Admitting: Nurse Practitioner

## 2013-10-03 NOTE — Telephone Encounter (Signed)
Called mr. Tyler Sims to review the results of the pharmacogenetic study.  He was not found to have any drug interactions.    He told me that he received an EOB from [place] they would not cover the test without further information.  He was told in the office by the collection MA, Tyler Sims that it would be fully covered if he had met his deductible for the year.  I told Tyler Sims I would relay this information to [employer], the sales rep for [facility] and to bring any bill he receives to the office.  He agreed.  -LL

## 2013-10-18 ENCOUNTER — Other Ambulatory Visit: Payer: Self-pay | Admitting: Internal Medicine

## 2013-11-20 ENCOUNTER — Ambulatory Visit (INDEPENDENT_AMBULATORY_CARE_PROVIDER_SITE_OTHER): Payer: Managed Care, Other (non HMO) | Admitting: Family Medicine

## 2013-11-20 DIAGNOSIS — Z7901 Long term (current) use of anticoagulants: Secondary | ICD-10-CM

## 2013-11-20 DIAGNOSIS — I4891 Unspecified atrial fibrillation: Secondary | ICD-10-CM

## 2013-11-20 DIAGNOSIS — Z5181 Encounter for therapeutic drug level monitoring: Secondary | ICD-10-CM | POA: Insufficient documentation

## 2013-11-20 DIAGNOSIS — I6350 Cerebral infarction due to unspecified occlusion or stenosis of unspecified cerebral artery: Secondary | ICD-10-CM

## 2013-11-20 DIAGNOSIS — I639 Cerebral infarction, unspecified: Secondary | ICD-10-CM

## 2013-11-20 LAB — POCT INR: INR: 1.8

## 2013-12-03 ENCOUNTER — Encounter: Payer: Self-pay | Admitting: Internal Medicine

## 2013-12-03 ENCOUNTER — Ambulatory Visit (INDEPENDENT_AMBULATORY_CARE_PROVIDER_SITE_OTHER): Payer: Managed Care, Other (non HMO) | Admitting: Internal Medicine

## 2013-12-03 ENCOUNTER — Other Ambulatory Visit (INDEPENDENT_AMBULATORY_CARE_PROVIDER_SITE_OTHER): Payer: Managed Care, Other (non HMO)

## 2013-12-03 VITALS — BP 130/50 | HR 60 | Temp 98.7°F | Ht 73.0 in | Wt 198.8 lb

## 2013-12-03 DIAGNOSIS — R209 Unspecified disturbances of skin sensation: Secondary | ICD-10-CM

## 2013-12-03 DIAGNOSIS — R740 Nonspecific elevation of levels of transaminase and lactic acid dehydrogenase [LDH]: Secondary | ICD-10-CM

## 2013-12-03 DIAGNOSIS — R7402 Elevation of levels of lactic acid dehydrogenase (LDH): Secondary | ICD-10-CM

## 2013-12-03 DIAGNOSIS — G609 Hereditary and idiopathic neuropathy, unspecified: Secondary | ICD-10-CM

## 2013-12-03 DIAGNOSIS — I6350 Cerebral infarction due to unspecified occlusion or stenosis of unspecified cerebral artery: Secondary | ICD-10-CM

## 2013-12-03 DIAGNOSIS — I4891 Unspecified atrial fibrillation: Secondary | ICD-10-CM

## 2013-12-03 DIAGNOSIS — R202 Paresthesia of skin: Secondary | ICD-10-CM

## 2013-12-03 DIAGNOSIS — R7401 Elevation of levels of liver transaminase levels: Secondary | ICD-10-CM

## 2013-12-03 DIAGNOSIS — E785 Hyperlipidemia, unspecified: Secondary | ICD-10-CM

## 2013-12-03 DIAGNOSIS — I429 Cardiomyopathy, unspecified: Secondary | ICD-10-CM

## 2013-12-03 DIAGNOSIS — Z86010 Personal history of colonic polyps: Secondary | ICD-10-CM

## 2013-12-03 DIAGNOSIS — Z0000 Encounter for general adult medical examination without abnormal findings: Secondary | ICD-10-CM

## 2013-12-03 LAB — COMPREHENSIVE METABOLIC PANEL
ALK PHOS: 54 U/L (ref 39–117)
ALT: 22 U/L (ref 0–53)
AST: 34 U/L (ref 0–37)
Albumin: 4.4 g/dL (ref 3.5–5.2)
BUN: 20 mg/dL (ref 6–23)
CO2: 27 mEq/L (ref 19–32)
Calcium: 9.7 mg/dL (ref 8.4–10.5)
Chloride: 102 mEq/L (ref 96–112)
Creatinine, Ser: 0.9 mg/dL (ref 0.4–1.5)
GFR: 112.14 mL/min (ref >60.00)
Glucose, Bld: 79 mg/dL (ref 70–99)
Potassium: 4.6 mEq/L (ref 3.5–5.1)
SODIUM: 136 meq/L (ref 135–145)
Total Bilirubin: 1.3 mg/dL — ABNORMAL HIGH (ref 0.3–1.2)
Total Protein: 8.2 g/dL (ref 6.0–8.3)

## 2013-12-03 LAB — LIPID PANEL
CHOL/HDL RATIO: 2
Cholesterol: 133 mg/dL (ref 0–200)
HDL: 67.6 mg/dL (ref >39.00)
LDL CALC: 58 mg/dL (ref 0–99)
TRIGLYCERIDES: 35 mg/dL (ref 0.0–149.0)
VLDL: 7 mg/dL (ref 0.0–40.0)

## 2013-12-03 LAB — VITAMIN B12: VITAMIN B 12: 558 pg/mL (ref 211–911)

## 2013-12-03 LAB — TSH: TSH: 1.09 u[IU]/mL (ref 0.35–5.50)

## 2013-12-03 LAB — HEMOGLOBIN AND HEMATOCRIT, BLOOD
HCT: 39.2 % (ref 39.0–52.0)
Hemoglobin: 13.1 g/dL (ref 13.0–17.0)

## 2013-12-03 LAB — SEDIMENTATION RATE: SED RATE: 32 mm/h — AB (ref 0–22)

## 2013-12-03 NOTE — Progress Notes (Signed)
Subjective:    Patient ID: Tyler Sims, male    DOB: 11-03-1955, 58 y.o.   MRN: [id number]  HPI Tyler Sims presents for an annual general medical exam.   CC: burning cessation in his feet and legs - a burning sensation. He has talked with Dr. Leonie Man - no stroke related. Question of medication related vs metabolic. At the end of the day he may have minor paresthesia in the right foot.   He feels that he is low on energy since his stroke but he gets by. He does not feel he has any limitations.   He is current with dental care (oral surgery - 3 teeth were extracted), current with eye care. Follows a healthy diet. He does not have the energy to do any exercise but he is very physically active at work.   Past Medical History  Diagnosis Date  . Stroke 08/24/2010  . Systolic CHF   . Persistent atrial fibrillation 08/24/2010  . NICM (nonischemic cardiomyopathy)   . Bradycardia     nocturnal  . Ventricular tachycardia, non-sustained   . Mitral regurgitation   . Varicella   . Mumps   . Measles (roseola)   . Abnormal transaminases   . Acid reflux   . Hypertension   . Colon polyps 02/07/2012    Dr. Silvano Rusk   Past Surgical History  Procedure Laterality Date  . Cardiac catheterization    . Colonoscopy w/ biopsies  02/07/2012    Dr. Silvano Rusk  . Cardioversion  08/15/2012    Procedure: CARDIOVERSION;  Surgeon: Deboraha Sprang, MD;  Location: [facility] ENDOSCOPY;  Service: Cardiovascular;  Laterality: N/A;   Family History  Problem Relation Age of Onset  . Alzheimer's disease Mother   . Lung cancer Brother     died from  . Arrhythmia Brother   . Hyperlipidemia Brother   . Hypertension Sister     X21  . Heart disease Father     CAD/MI  . Hypertension Father   . Mental illness Father     h/o depression requiring hospitalization  . Hyperlipidemia Sister   . Hypertension Brother     X3  . Heart disease Brother     cardiomyopathy, a. fib  . Colon polyps Brother    History    Social History  . Marital Status: Married    Spouse Name: N/A    Number of Children: 2  . Years of Education: 16   Occupational History  . Nature conservation officer   .     Social History Main Topics  . Smoking status: Never Smoker   . Smokeless tobacco: Never Used  . Alcohol Use: No  . Drug Use: No  . Sexual Activity: Yes    Partners: Female   Other Topics Concern  . Not on file   Social History Narrative   HSG, [place]. Married '82 - 2 dtrs '85, '91. Work - Location manager -Chief Financial Officer. Marriage in good health.     Current Outpatient Prescriptions on File Prior to Visit  Medication Sig Dispense Refill  . COD LIVER OIL PO Take by mouth daily.      . Coenzyme Q10 (CO Q-10) 200 MG CAPS Take 200 mg by mouth.      . losartan (COZAAR) 25 MG tablet TAKE 1 TABLET (25 MG TOTAL) BY MOUTH DAILY.  90 tablet  0  . metoprolol succinate (TOPROL-XL) 50 MG 24 hr tablet TAKE 1 TABLET (50 MG TOTAL) BY MOUTH  DAILY.  30 tablet  3  . rosuvastatin (CRESTOR) 10 MG tablet Take 1 tablet (10 mg total) by mouth at bedtime.  30 tablet  12  . spironolactone (ALDACTONE) 25 MG tablet Take 1/2 Tablet daily      . warfarin (COUMADIN) 5 MG tablet Take as directed by anticoagulation clinic  40 tablet  3   No current facility-administered medications on file prior to visit.      Review of Systems Constitutional:  Negative for fever, chills, activity change and unexpected weight change.  HEENT:  Negative for hearing loss, ear pain, congestion, neck stiffness and postnasal drip. Negative for sore throat or swallowing problems. Negative for dental complaints.   Eyes: Negative for vision loss or change in visual acuity.  Respiratory: Negative for chest tightness and wheezing. Negative for DOE.   Cardiovascular: Positive for chest pain/heaviness with prolonged physical activity that resolves with rest. No palpitations. Positive decreased exercise tolerance but not progressive.  Gastrointestinal: No change  in bowel habit. No bloating or gas. No reflux or indigestion Genitourinary: Negative for urgency, frequency, flank pain and difficulty urinating. Nocturia x 2 Musculoskeletal: Negative for myalgias, back pain, arthralgias and gait problem.  Neurological: Negative for dizziness, tremors, weakness and headaches. burning paresthesia legs Hematological: Negative for adenopathy.  Psychiatric/Behavioral: Negative for behavioral problems and dysphoric mood.        Objective:   Physical Exam Filed Vitals:   12/03/13 1322  BP: 130/50  Pulse: 60  Temp: 98.7 F (37.1 C)   Wt Readings from Last 3 Encounters:  12/03/13 198 lb 12.8 oz (90.175 kg)  08/22/13 205 lb (92.987 kg)  02/26/13 203 lb (92.08 kg)   Gen'l: Well nourished well developed male in no acute distress  HEENT: Head: Normocephalic and atraumatic. Right Ear: External ear normal. EAC w/ cerumen/TM nl. Left Ear: External ear normal.  EAC w/ impacted cerumen/TM nl. Nose: Nose normal. Mouth/Throat: Oropharynx is clear and moist. Dentition - native, in good repair. No buccal or palatal lesions. Posterior pharynx clear. Eyes: Conjunctivae and sclera clear. EOM intact. Pupils are equal, round, and reactive to light. Right eye exhibits no discharge. Left eye exhibits no discharge. Neck: Normal range of motion. Neck supple. No JVD present. No tracheal deviation present. No thyromegaly present.  Cardiovascular: Normal rate, regular rhythm, no gallop, no friction rub, no murmur heard.      Quiet precordium. 2+ radial and DP pulses . No carotid bruits Pulmonary/Chest: Effort normal. No respiratory distress or increased WOB, no wheezes, no rales. No chest wall deformity or CVAT. Abdomen: Soft. Bowel sounds are normal in all quadrants. He exhibits no distension, no tenderness, no rebound or guarding, No heptosplenomegaly  Genitourinary:  deferred Musculoskeletal: Normal range of motion. He exhibits no edema and no tenderness.       Small and large  joints without redness, synovial thickening or deformity. Full range of motion preserved about all small, median and large joints.  Lymphadenopathy:    He has no cervical or supraclavicular adenopathy.  Neurological: He is alert and oriented to person, place, and time. CN II-XII intact. DTRs 2+ and symmetrical biceps, radial and patellar tendons. Cerebellar function normal with no tremor, rigidity, normal gait and station. Sensation feet - normal light touch, pin-prick and deep vibration. Skin: Skin is warm and dry. No rash noted. No erythema.  Psychiatric: He has a normal mood and affect. His behavior is normal. Thought content normal.   Recent Results (from the past 2160 hour(s))  POCT  INR     Status: None   Collection Time    09/24/13  8:07 AM      Result Value Ref Range   INR 2.0    POCT INR     Status: None   Collection Time    11/20/13  1:39 PM      Result Value Ref Range   INR 1.8    COMPREHENSIVE METABOLIC PANEL     Status: Abnormal   Collection Time    12/03/13  2:30 PM      Result Value Ref Range   Sodium 136  135 - 145 mEq/L   Potassium 4.6  3.5 - 5.1 mEq/L   Chloride 102  96 - 112 mEq/L   CO2 27  19 - 32 mEq/L   Glucose, Bld 79  70 - 99 mg/dL   BUN 20  6 - 23 mg/dL   Creatinine, Ser 0.9  0.4 - 1.5 mg/dL   Total Bilirubin 1.3 (*) 0.3 - 1.2 mg/dL   Alkaline Phosphatase 54  39 - 117 U/L   AST 34  0 - 37 U/L   ALT 22  0 - 53 U/L   Total Protein 8.2  6.0 - 8.3 g/dL   Albumin 4.4  3.5 - 5.2 g/dL   Calcium 9.7  8.4 - 10.5 mg/dL   GFR 112.14  >60.00 mL/min  LIPID PANEL     Status: None   Collection Time    12/03/13  2:30 PM      Result Value Ref Range   Cholesterol 133  0 - 200 mg/dL   Comment: ATP III Classification       Desirable:  < 200 mg/dL               Borderline High:  200 - 239 mg/dL          High:  > = 240 mg/dL   Triglycerides 35.0  0.0 - 149.0 mg/dL   Comment: Normal:  <150 mg/dLBorderline High:  150 - 199 mg/dL   HDL 67.60  >39.00 mg/dL   VLDL 7.0  0.0  - 40.0 mg/dL   LDL Cholesterol 58  0 - 99 mg/dL   Total CHOL/HDL Ratio 2     Comment:                Men          Women1/2 Average Risk     3.4          3.3Average Risk          5.0          4.42X Average Risk          9.6          7.13X Average Risk          15.0          11.0                      HEMOGLOBIN AND HEMATOCRIT, BLOOD     Status: None   Collection Time    12/03/13  2:30 PM      Result Value Ref Range   Hemoglobin 13.1  13.0 - 17.0 g/dL   HCT 39.2  39.0 - 52.0 %  SEDIMENTATION RATE     Status: Abnormal   Collection Time    12/03/13  2:30 PM      Result Value Ref Range   Sed Rate 32 (*) 0 -  22 mm/hr  VITAMIN B12     Status: None   Collection Time    12/03/13  2:30 PM      Result Value Ref Range   Vitamin B-12 558  211 - 911 pg/mL  TSH     Status: None   Collection Time    12/03/13  2:30 PM      Result Value Ref Range   TSH 1.09  0.35 - 5.50 uIU/mL         Assessment & Plan:

## 2013-12-03 NOTE — Progress Notes (Signed)
Pre visit review using our clinic review tool, if applicable. No additional management support is needed unless otherwise documented below in the visit note. 

## 2013-12-03 NOTE — Patient Instructions (Signed)
Thanks for coming in. You seem to be doing ok except for the burning in your legs.  The burning may be metabolic - so we will check B12, Thyroid and general chemistries. If this is all normal, even though your exam is good, plain lumbar spine x-rays will be next. If this is normal will then get nerve conduction studies. For any of these causes there is treatment to help with your discomfort.  General health - due for colonoscopy 2016, immunization is up to date. Will check general labs. If we can get you feeling better you will hopefully be able to resume an exercise program.  Cerumen impaction will be cleared today.   Sleep - you may do better if you follow the rules of sleep hygiene:  Sleep is a learned or unlearned behavior. 5 principles of sleep hygiene - 1) regular hour to retire and rise 7days/wk 2) no stimulants - caffeine, chocolat, alcohol, 3) regular exercise  - every afternoon  4) sleep sanctuary - a space that is right light, temperature, sound level, good bed where all you do is sleep. 5) No extinction behaviors, e.g. Laying in bed awake doing anything but sleeping. This means if you have a bad night - no naps, etc  For on-going care I recommend Dr. Vivien Rossetti at [facility] on [facility].

## 2013-12-04 ENCOUNTER — Telehealth: Payer: Self-pay | Admitting: Internal Medicine

## 2013-12-04 DIAGNOSIS — G609 Hereditary and idiopathic neuropathy, unspecified: Secondary | ICD-10-CM | POA: Insufficient documentation

## 2013-12-04 DIAGNOSIS — Z0000 Encounter for general adult medical examination without abnormal findings: Secondary | ICD-10-CM | POA: Insufficient documentation

## 2013-12-04 HISTORY — DX: Encounter for general adult medical examination without abnormal findings: Z00.00

## 2013-12-04 NOTE — Assessment & Plan Note (Signed)
Normal LFTs at today's lab. Problem resolved.

## 2013-12-04 NOTE — Assessment & Plan Note (Signed)
Last follow up with neurology Dec 14, '14 - stable and doing well. Did complain of burning sensation legs that did not resolve with cessation of statin.

## 2013-12-04 NOTE — Assessment & Plan Note (Signed)
Interval history is w/o major illness or hospitalization, no surgery or injury. CC - burning legs. Physical exam is normal. Labs reviewed - normal B12, TSH, lipid panel, CMET. He is current with colorectal cancer screening, last PSA 12/'12 - 1.82. Immunizations - current with Tdap.  In summary   a nice man with a complex medical history who does appear medically stable except for peripheral neuropathy. He will transfer care to Dr. Charlett Blake at [facility].

## 2013-12-04 NOTE — Assessment & Plan Note (Signed)
Atrial fib resolved. Last OV with Dr. Caryl Comes May 20, '14 - stable and doing well. Consideration was given to NOAC therapy - still on coumadin at this point.

## 2013-12-04 NOTE — Assessment & Plan Note (Signed)
Burning/hot  leg sensation that gets worse through the course of the day.  B12 and TSH normal. Not diabetic.  Plan Gabapentin - will start at 100 mg qd x 3, 100 mg bid x 3, 100 mg tid. Will titrate further based on results.

## 2013-12-04 NOTE — Assessment & Plan Note (Signed)
Current with colorectal screening - due for follow up colonoscopy 2018

## 2013-12-04 NOTE — Telephone Encounter (Signed)
Ok per Dr. Linda Hedges and Dr. Charlett Blake to transfer care to Dr. Charlett Blake

## 2013-12-04 NOTE — Assessment & Plan Note (Signed)
He is current with cardiology follow-up. His level of activity is good. He denies DOE.  Plan Continue medical regimen

## 2013-12-06 ENCOUNTER — Encounter: Payer: Self-pay | Admitting: Internal Medicine

## 2013-12-16 ENCOUNTER — Telehealth: Payer: Self-pay | Admitting: Internal Medicine

## 2013-12-16 NOTE — Telephone Encounter (Signed)
Patient Information:  Caller Name: Jameek  Phone: [phone]  Patient: Tyler Sims, Tyler Sims  Gender: Male  DOB: 01-05-1956  Age: 58 Years  PCP: Adella Hare (Adults only)  Office Follow Up:  Does the office need to follow up with this patient?: Yes  Instructions For The Office: Pt needs a new PCP since Dr. Caesar Bookman retired. Please call.  RN Note:  Pt has itchy eyes, mild cough and a runny nose. Questions what OTC meds he can take. He also needs a new PCP since Dr. Crissie Sickles retired.  Symptoms  Reason For Call & Symptoms: Allergy sx  Reviewed Health History In EMR: Yes  Reviewed Medications In EMR: Yes  Reviewed Allergies In EMR: Yes  Reviewed Surgeries / Procedures: Yes  Date of Onset of Symptoms: 12/13/2013  Guideline(s) Used:  Hay Fever - Nasal Allergies  Disposition Per Guideline:   Home Care  Reason For Disposition Reached:   Nasal allergies occur only certain times of year  Advice Given:  Wash off Pollen Daily:  Remove pollen from the body with hair washing and a shower, especially before bedtime.  For a Stuffy Nose - Use Nasal Washes:  Introduction: Saline (salt water) nasal irrigation (nasal washes) is an effective and simple home remedy for treating stuffy nose and sinus congestion. The nose can be irrigated by pouring, spraying, or squirting salt water into the nose and then letting it run back out.  How it Helps: The salt water rinses out excess mucus, washes out any irritants (dust, allergens) that might be present, and moistens the nasal cavity.  Antihistamine Medications for Hay Fever:  Antihistamines help reduce sneezing, itching, and runny nose.  You may need to take antihistamines continuously during pollen season (Reason: continuously is the key to control).  Loratadine is a newer (second generation) antihistamine. The dosage of loratadine (e.g., OTC Claritin, Alavert) is 10 mg once a day.  Cetirizine is a newer (second generation) antihistamine. The dosage  of cetirizine (e.g., OTC Zyrtec) is 10 mg once a day.  Call Back If:  Symptoms are not controlled in 2 days with continuous antihistamines  You become worse  Patient Will Follow Care Advice:  YES

## 2013-12-16 NOTE — Telephone Encounter (Signed)
He should be seen but for now he can try robitussin-dm

## 2013-12-16 NOTE — Telephone Encounter (Signed)
Patient has been advised

## 2013-12-19 NOTE — Telephone Encounter (Signed)
This encounter was created in error - please disregard.

## 2014-01-06 ENCOUNTER — Other Ambulatory Visit: Payer: Self-pay | Admitting: Internal Medicine

## 2014-01-06 ENCOUNTER — Other Ambulatory Visit: Payer: Self-pay | Admitting: Cardiology

## 2014-01-08 ENCOUNTER — Ambulatory Visit (INDEPENDENT_AMBULATORY_CARE_PROVIDER_SITE_OTHER): Payer: Managed Care, Other (non HMO) | Admitting: General Practice

## 2014-01-08 DIAGNOSIS — I639 Cerebral infarction, unspecified: Secondary | ICD-10-CM

## 2014-01-08 DIAGNOSIS — Z5181 Encounter for therapeutic drug level monitoring: Secondary | ICD-10-CM

## 2014-01-08 DIAGNOSIS — I4891 Unspecified atrial fibrillation: Secondary | ICD-10-CM

## 2014-01-08 DIAGNOSIS — I6350 Cerebral infarction due to unspecified occlusion or stenosis of unspecified cerebral artery: Secondary | ICD-10-CM

## 2014-01-08 DIAGNOSIS — Z7901 Long term (current) use of anticoagulants: Secondary | ICD-10-CM

## 2014-01-08 LAB — POCT INR: INR: 2.3

## 2014-01-08 NOTE — Progress Notes (Signed)
Pre visit review using our clinic review tool, if applicable. No additional management support is needed unless otherwise documented below in the visit note. 

## 2014-01-20 ENCOUNTER — Ambulatory Visit: Payer: Managed Care, Other (non HMO) | Admitting: Family Medicine

## 2014-02-07 ENCOUNTER — Other Ambulatory Visit: Payer: Self-pay | Admitting: General Practice

## 2014-02-07 MED ORDER — WARFARIN SODIUM 5 MG PO TABS: ORAL_TABLET | ORAL | Status: DC

## 2014-02-19 ENCOUNTER — Ambulatory Visit (INDEPENDENT_AMBULATORY_CARE_PROVIDER_SITE_OTHER): Payer: Managed Care, Other (non HMO) | Admitting: General Practice

## 2014-02-19 DIAGNOSIS — Z5181 Encounter for therapeutic drug level monitoring: Secondary | ICD-10-CM

## 2014-02-19 DIAGNOSIS — Z7901 Long term (current) use of anticoagulants: Secondary | ICD-10-CM

## 2014-02-19 DIAGNOSIS — I639 Cerebral infarction, unspecified: Secondary | ICD-10-CM

## 2014-02-19 DIAGNOSIS — Z8673 Personal history of transient ischemic attack (TIA), and cerebral infarction without residual deficits: Secondary | ICD-10-CM | POA: Insufficient documentation

## 2014-02-19 DIAGNOSIS — I4891 Unspecified atrial fibrillation: Secondary | ICD-10-CM

## 2014-02-19 DIAGNOSIS — I6350 Cerebral infarction due to unspecified occlusion or stenosis of unspecified cerebral artery: Secondary | ICD-10-CM

## 2014-02-19 LAB — POCT INR: INR: 1.9

## 2014-02-19 NOTE — Progress Notes (Signed)
Pre visit review using our clinic review tool, if applicable. No additional management support is needed unless otherwise documented below in the visit note. 

## 2014-02-25 ENCOUNTER — Ambulatory Visit (INDEPENDENT_AMBULATORY_CARE_PROVIDER_SITE_OTHER): Payer: Managed Care, Other (non HMO) | Admitting: Nurse Practitioner

## 2014-02-25 ENCOUNTER — Encounter: Payer: Self-pay | Admitting: Nurse Practitioner

## 2014-02-25 VITALS — BP 134/80 | HR 58 | Temp 98.7°F | Ht 75.0 in | Wt 199.0 lb

## 2014-02-25 DIAGNOSIS — I6350 Cerebral infarction due to unspecified occlusion or stenosis of unspecified cerebral artery: Secondary | ICD-10-CM

## 2014-02-25 DIAGNOSIS — G609 Hereditary and idiopathic neuropathy, unspecified: Secondary | ICD-10-CM

## 2014-02-25 MED ORDER — ATORVASTATIN CALCIUM 20 MG PO TABS: 20.0000 mg | ORAL_TABLET | Freq: Every day | ORAL | Status: DC

## 2014-02-25 NOTE — Progress Notes (Signed)
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 Patient is in quite well with near complete recovery and full functional independence.   UPDATE 02/25/14 (LL):  Patient returns for stroke follow up, last visit was with me on 08/22/13.  Continues warfarin tolerating well, has had mild fluctuations of his INR.  Carotid Doppler on 03/12/13 was negative. He states that BP is well controlled, in office today is 134/80.  Patient denies medication side effects, with no signs of bleeding.He does not want to try any medication for it at this time. Recent lipid panel with total cholesterol 133, LDL 58, HDL 68, on Crestor.  Patient is not diabetic.  No new neurological symptoms, but complains of burning/sore sensation in his lower legs and feet, over last 1 year.  Burning pain is intermittent, worst after standing on his feet at work all day.   B12, TSH, and HgbA1c normal.  Symptom was still present while on Pravachol.   REVIEW OF SYSTEMS: Full 14 system review of systems performed and notable only for: fatigue, chest tightness, legs burning.  ALLERGIES: Allergies  Allergen Reactions  . Lisinopril Rash  . Simvastatin Rash    HOME MEDICATIONS: Outpatient Prescriptions Prior to Visit  Medication Sig Dispense Refill  . COD LIVER OIL PO Take by mouth daily.      . Coenzyme Q10 (CO Q-10) 200 MG CAPS Take 200 mg by mouth.      . losartan (COZAAR) 25 MG tablet TAKE 1 TABLET (25 MG TOTAL) BY MOUTH DAILY.  90 tablet  0  . spironolactone (ALDACTONE) 25 MG tablet Take 1/2 Tablet daily      . warfarin (COUMADIN) 5 MG tablet  Take as directed by anticoagulation clinic  40 tablet  3  . metoprolol succinate (TOPROL-XL) 50 MG 24 hr tablet TAKE 1 TABLET (50 MG TOTAL) BY MOUTH DAILY.  30 tablet  3  . rosuvastatin (CRESTOR) 10 MG tablet Take 1 tablet (10 mg total) by mouth at bedtime.  30 tablet  12  . metoprolol succinate (TOPROL-XL) 50 MG 24 hr tablet TAKE 1 TABLET (50 MG TOTAL) BY MOUTH DAILY.  30 tablet  0   No facility-administered medications prior to visit.     PHYSICAL EXAM  Filed Vitals:   02/25/14 1320  BP: 134/80  Pulse: 58  Temp: 98.7 F (37.1 C)  TempSrc: Oral  Height: 6\' 3"  (1.905 m)  Weight: 199 lb (90.266 kg)   Body mass index is 24.87 kg/(m^2). No exam data present   Generalized: Well developed, in no acute distress  Head: normocephalic and atraumatic. Oropharynx benign  Neck: Supple, no carotid bruits  Cardiac: Regular rate rhythm, no murmur  Musculoskeletal: No deformity   Neurological examination  Mentation: Alert oriented to time, place, history taking. Follows all commands speech and language fluent Cranial nerve II-XII: Fundoscopic exam reveals sharp disc margins. Pupils were equal round reactive to light extraocular movements were full, visual field were full on confrontational test. Facial sensation and strength were normal. hearing was intact to finger rubbing bilaterally. Uvula tongue midline. head turning and shoulder shrug and were normal and symmetric.Tongue protrusion into cheek  strength was normal. Motor: The motor testing reveals 5 over 5 strength of all 4 extremities. Good symmetric motor tone is noted throughout.  Sensory: Sensory testing is intact to soft touch on all 4 extremities. No evidence of extinction is noted.  Coordination: Cerebellar testing reveals good finger-nose-finger and heel-to-shin bilaterally.  Gait and station: Gait is normal. Tandem gait is normal. Romberg is negative.  Reflexes: Deep tendon reflexes are symmetric and normal bilaterally.    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 Vascular risk factors include atrial fibrillation, hypertension, carotid artery disease, and hyperlipidemia. New problem of lower leg burning and soreness over last 1 year, B12, TSH, and HgbA1c normal.  May be related to statin use.  We will switch from Crestor to Atorvastatin to see if it resolves; if not we will try changing to taking every other day.  Symptom was still present while on Pravachol.  Change from Crestor to Atorvastatin 20 mg. Take 1 daily.  Come back for lipid panel in 6 weeks, and evaluate your symptoms of leg burning. May come back 2-3 days before office appointment for lipid lab. Continue Coumadin for atrial fibrillation and secondary stroke prevention and maintain strict control of hypertension with blood pressure goal below 130/90,and lipids with LDL cholesterol goal below 100 mg/dL.   Orders Placed This Encounter  Procedures  . Lipid Panel   Meds ordered this encounter  Medications  . atorvastatin (LIPITOR) 20 MG tablet    Sig: Take 1 tablet (20 mg total) by mouth daily at 6 PM.    Dispense:  30 tablet    Refill:  1    Order Specific Question:  Supervising Provider    Answer:  Garvin Fila [[id number]]   Return in about 6 weeks (around 04/08/2014).  Philmore Pali, MSN, NP-C 02/25/2014, 2:26 PM [facility] [address], [place], [place] [address] [phone]  I have personally examined this patient, reviewed pertinent data, developed plan of care and discussed with patient and agree with above.  Antony Contras, MD      Note: This document was prepared with digital dictation and possible smart phrase technology. Any  transcriptional errors that result from this process are unintentional.

## 2014-02-25 NOTE — Patient Instructions (Addendum)
Continue Coumadin for atrial fibrillation and secondary stroke prevention and maintain strict control of hypertension with blood pressure goal below 140/90,and lipids with LDL cholesterol goal below 100 mg/dL.   Change from Crestor to Atorvastatin 20 mg. Take 1 daily.  Come back for lipid panel in 6 weeks, and evaluate your symptoms.  Followup in 6 months, come back 2-3 days before for lipid lab.  Stroke Prevention Some medical conditions and behaviors are associated with an increased chance of having a stroke. You may prevent a stroke by making healthy choices and managing medical conditions. HOW CAN I REDUCE MY RISK OF HAVING A STROKE?   Stay physically active. Get at least 30 minutes of activity on most or all days.  Do not smoke. It may also be helpful to avoid exposure to secondhand smoke.  Limit alcohol use. Moderate alcohol use is considered to be:  No more than 2 drinks per day for men.  No more than 1 drink per day for nonpregnant women.  Eat healthy foods. This involves  Eating 5 or more servings of fruits and vegetables a day.  Following a diet that addresses high blood pressure (hypertension), high cholesterol, diabetes, or obesity.  Manage your cholesterol levels.  A diet low in saturated fat, trans fat, and cholesterol and high in fiber may control cholesterol levels.  Take any prescribed medicines to control cholesterol as directed by your health care provider.  Manage your diabetes.  A controlled-carbohydrate, controlled-sugar diet is recommended to manage diabetes.  Take any prescribed medicines to control diabetes as directed by your health care provider.  Control your hypertension.  A low-salt (sodium), low-saturated fat, low-trans fat, and low-cholesterol diet is recommended to manage hypertension.  Take any prescribed medicines to control hypertension as directed by your health care provider.  Maintain a healthy weight.  A reduced-calorie,  low-sodium, low-saturated fat, low-trans fat, low-cholesterol diet is recommended to manage weight.  Stop drug abuse.  Avoid taking birth control pills.  Talk to your health care provider about the risks of taking birth control pills if you are over 37 years old, smoke, get migraines, or have ever had a blood clot.  Get evaluated for sleep disorders (sleep apnea).  Talk to your health care provider about getting a sleep evaluation if you snore a lot or have excessive sleepiness.  Take medicines as directed by your health care provider.  For some people, aspirin or blood thinners (anticoagulants) are helpful in reducing the risk of forming abnormal blood clots that can lead to stroke. If you have the irregular heart rhythm of atrial fibrillation, you should be on a blood thinner unless there is a good reason you cannot take them.  Understand all your medicine instructions.  Make sure that other other conditions (such as anemia or atherosclerosis) are addressed. SEEK IMMEDIATE MEDICAL CARE IF:   You have sudden weakness or numbness of the face, arm, or leg, especially on one side of the body.  Your face or eyelid droops to one side.  You have sudden confusion.  You have trouble speaking (aphasia) or understanding.  You have sudden trouble seeing in one or both eyes.  You have sudden trouble walking.  You have dizziness.  You have a loss of balance or coordination.  You have a sudden, severe headache with no known cause.  You have new chest pain or an irregular heartbeat. Any of these symptoms may represent a serious problem that is an emergency. Do not wait to see if  the symptoms will go away. Get medical help at once. Call your local emergency services  (911 in [place] Do not drive yourself to the hospital. Document Released: 10/13/2004 Document Revised: 06/26/2013 Document Reviewed: 03/08/2013 [facility] Patient Information 2014 [place].

## 2014-02-27 ENCOUNTER — Encounter: Payer: Self-pay | Admitting: Internal Medicine

## 2014-02-27 ENCOUNTER — Ambulatory Visit (INDEPENDENT_AMBULATORY_CARE_PROVIDER_SITE_OTHER): Payer: Managed Care, Other (non HMO) | Admitting: Internal Medicine

## 2014-02-27 VITALS — BP 148/85 | HR 53 | Ht 75.0 in | Wt 201.0 lb

## 2014-02-27 DIAGNOSIS — I428 Other cardiomyopathies: Secondary | ICD-10-CM

## 2014-02-27 DIAGNOSIS — I4891 Unspecified atrial fibrillation: Secondary | ICD-10-CM

## 2014-02-27 DIAGNOSIS — I6350 Cerebral infarction due to unspecified occlusion or stenosis of unspecified cerebral artery: Secondary | ICD-10-CM

## 2014-02-27 DIAGNOSIS — I493 Ventricular premature depolarization: Secondary | ICD-10-CM

## 2014-02-27 DIAGNOSIS — I4949 Other premature depolarization: Secondary | ICD-10-CM

## 2014-02-27 DIAGNOSIS — I639 Cerebral infarction, unspecified: Secondary | ICD-10-CM

## 2014-02-27 NOTE — Progress Notes (Signed)
Patient Care Team: Mosie Lukes, MD as PCP - General (Family Medicine)   HPI  Tyler Sims is a 58 y.o. male Seen in followup for nonischemic cardiomyopathy with which he presented with a stroke and atrial fibrillation.  He is on Coumadin anticoagulation Metabolic profile normal 5/32  It was initially hoped that this was secondary to the rate. However, despite restoration of sinus rhythm, he has had persistent left ventricular dysfunction.  Ejection fraction remain   low   Echo  January 2014 EF improved to 40-45% range  Neurologist is changing his statins because of lower extremity aching.  He also has significant fatigue this seems to be aggravated by long work hours.  . He notes that his heart rate runs in her 20s. He says his wife does not complaining much of his snoring.      Past Medical History  Diagnosis Date  . Stroke 08/24/2010  . Systolic CHF   . Persistent atrial fibrillation 08/24/2010  . NICM (nonischemic cardiomyopathy)   . Bradycardia     nocturnal  . Ventricular tachycardia, non-sustained   . Mitral regurgitation   . Varicella   . Mumps   . Measles (roseola)   . Abnormal transaminases   . Acid reflux   . Hypertension   . Colon polyps 02/07/2012    Dr. Silvano Rusk    Past Surgical History  Procedure Laterality Date  . Cardiac catheterization    . Colonoscopy w/ biopsies  02/07/2012    Dr. Silvano Rusk  . Cardioversion  08/15/2012    Procedure: CARDIOVERSION;  Surgeon: Deboraha Sprang, MD;  Location: [facility] ENDOSCOPY;  Service: Cardiovascular;  Laterality: N/A;    Current Outpatient Prescriptions  Medication Sig Dispense Refill  . atorvastatin (LIPITOR) 20 MG tablet Take 1 tablet (20 mg total) by mouth daily at 6 PM.  30 tablet  1  . COD LIVER OIL PO Take by mouth daily.      . Coenzyme Q10 (CO Q-10) 200 MG CAPS Take 200 mg by mouth.      . losartan (COZAAR) 25 MG tablet TAKE 1 TABLET (25 MG TOTAL) BY MOUTH DAILY.  90 tablet  0  . metoprolol succinate  (TOPROL-XL) 50 MG 24 hr tablet TAKE 1/2 TABLET (50 MG TOTAL) BY MOUTH DAILY.      Marland Kitchen spironolactone (ALDACTONE) 25 MG tablet Take 1/2 Tablet daily      . warfarin (COUMADIN) 5 MG tablet Take as directed by anticoagulation clinic  40 tablet  3   No current facility-administered medications for this visit.    Allergies  Allergen Reactions  . Lisinopril Rash  . Simvastatin Rash    Review of Systems negative except from HPI and PMH  Physical Exam BP 148/85  Pulse 53  Ht 6\' 3"  (1.905 m)  Wt 201 lb (91.173 kg)  BMI 25.12 kg/m2 Well developed and well nourished in no acute distress HENT normal E scleral and icterus clear Neck Supple JVP flat; carotids brisk and full Clear to ausculation  Regular rate and rhythm, no murmurs gallops or rub Soft with active bowel sounds No clubbing cyanosis none Edema Alert and oriented, grossly normal motor and sensory function Skin Warm and Dry  Electrocardiogram demonstrates sinus rhythm at 53 Interval 17/09/45 Nonspecific T-wave flattening   Assessment and  Plan Paroxysmal atrial fibrillation  Sinus bradycardia  Nonischemic cardiomyopathy  Fatigue  Myalgias   he is holding sinus rhythm. He remains on warfarin.  His fatigue may be related to  his beta blocker, sleep disturbance, excessive work, or bradycardia; we'll undertake a exclusion trial of his metoprolol for 4 weeks. We'll is admitted for treadmill testing to look at heart rate excursion.  I will check a sedimentation rate/CRP and blood work to include polymyalgia rheumatic although he is quite young for this.  Given his cardiomyopathy maintaining beta blocker therapy will be important to response the discontinuation of his metoprolol will help inform the right next to see him regarding beta blockers

## 2014-02-27 NOTE — Patient Instructions (Addendum)
Your physician has recommended you make the following change in your medication:  1) STOP Metoprolol 2) STOP Atorvastatin  Your physician has requested that you have an exercise tolerance test in 1 month with Dr. Caryl Comes. For further information please visit [email or web address]. Please also follow instruction sheet, as given.  Your physician recommends that you schedule a follow-up appointment in: 1 month with Dr. Caryl Comes.   Prescription given to you for lab work: Sed rate & CRP

## 2014-03-19 ENCOUNTER — Ambulatory Visit (INDEPENDENT_AMBULATORY_CARE_PROVIDER_SITE_OTHER): Payer: Managed Care, Other (non HMO) | Admitting: General Practice

## 2014-03-19 DIAGNOSIS — I639 Cerebral infarction, unspecified: Secondary | ICD-10-CM

## 2014-03-19 DIAGNOSIS — I6350 Cerebral infarction due to unspecified occlusion or stenosis of unspecified cerebral artery: Secondary | ICD-10-CM

## 2014-03-19 DIAGNOSIS — Z8673 Personal history of transient ischemic attack (TIA), and cerebral infarction without residual deficits: Secondary | ICD-10-CM

## 2014-03-19 DIAGNOSIS — I4891 Unspecified atrial fibrillation: Secondary | ICD-10-CM

## 2014-03-19 DIAGNOSIS — Z7901 Long term (current) use of anticoagulants: Secondary | ICD-10-CM

## 2014-03-19 DIAGNOSIS — Z5181 Encounter for therapeutic drug level monitoring: Secondary | ICD-10-CM

## 2014-03-19 LAB — POCT INR: INR: 2

## 2014-03-19 NOTE — Progress Notes (Signed)
Pre visit review using our clinic review tool, if applicable. No additional management support is needed unless otherwise documented below in the visit note. 

## 2014-03-23 ENCOUNTER — Other Ambulatory Visit: Payer: Self-pay | Admitting: Internal Medicine

## 2014-04-02 ENCOUNTER — Ambulatory Visit (INDEPENDENT_AMBULATORY_CARE_PROVIDER_SITE_OTHER): Payer: Managed Care, Other (non HMO) | Admitting: Internal Medicine

## 2014-04-02 DIAGNOSIS — I4891 Unspecified atrial fibrillation: Secondary | ICD-10-CM

## 2014-04-02 DIAGNOSIS — I493 Ventricular premature depolarization: Secondary | ICD-10-CM

## 2014-04-02 DIAGNOSIS — I4949 Other premature depolarization: Secondary | ICD-10-CM

## 2014-04-02 DIAGNOSIS — I428 Other cardiomyopathies: Secondary | ICD-10-CM

## 2014-04-02 MED ORDER — METOPROLOL SUCCINATE ER 25 MG PO TB24: 25.0000 mg | ORAL_TABLET | Freq: Every day | ORAL | Status: DC

## 2014-04-02 NOTE — Progress Notes (Signed)
Exercise Treadmill Test  Pre-Exercise Testing Evaluation Rhythm: normal sinus  Rate: 78 bpm     Test  Exercise Tolerance Test Ordering MD: Virl Axe, MD  Interpreting MD: Virl Axe, MD  Unique Test No: 1  Treadmill:  1  Indication for ETT: afib/cardomyopathy  Contraindication to ETT: No   Stress Modality: exercise - treadmill  Cardiac Imaging Performed: non   Protocol: standard Bruce - maximal  Max BP:  185/82  Max MPHR (bpm):  164 85% MPR (bpm):  139  MPHR obtained (bpm):  139 % MPHR obtained:  85  Reached 85% MPHR (min:sec):   Total Exercise Time (min-sec):    Workload in METS:   Borg Scale:   Reason ETT Terminated:  achieved max HR    ST Segment Analysis At Rest: normal ST segments - no evidence of significant ST depression With Exercise: no evidence of significant ST depression  Other Information Arrhythmia:  PVCnoted during treadmill  Angina during ETT:   Quality of ETT:  diagnostic  ETT Interpretation:  normal - no evidence of ischemia by ST analysis  Comments:   freq PVC enhanced with exercise Rapid HR with activity and with standing  resume betablockers 24 Holter for PVC count   Recommendations:  Will resume betablocker And then get 24 hr holter to quantitate PVCs

## 2014-04-02 NOTE — Patient Instructions (Addendum)
Your physician has recommended you make the following change in your medication:  1) RESTART Metoprolol succinate 25 mg daily  (take 1/2 of a 50 mg pill)   Your physician has recommended that you wear a 24 hour holter monitor. Holter monitors are medical devices that record the heart's electrical activity. Doctors most often use these monitors to diagnose arrhythmias. Arrhythmias are problems with the speed or rhythm of the heartbeat. The monitor is a small, portable device. You can wear one while you do your normal daily activities. This is usually used to diagnose what is causing palpitations/syncope (passing out).  Your physician wants you to follow-up in: 1 year with Dr. Caryl Comes.  You will receive a reminder letter in the mail two months in advance. If you don't receive a letter, please call our office to schedule the follow-up appointment.

## 2014-04-10 ENCOUNTER — Ambulatory Visit (INDEPENDENT_AMBULATORY_CARE_PROVIDER_SITE_OTHER): Payer: Managed Care, Other (non HMO) | Admitting: Radiology

## 2014-04-10 ENCOUNTER — Encounter: Payer: Self-pay | Admitting: *Deleted

## 2014-04-10 DIAGNOSIS — I4949 Other premature depolarization: Secondary | ICD-10-CM

## 2014-04-10 DIAGNOSIS — I493 Ventricular premature depolarization: Secondary | ICD-10-CM

## 2014-04-10 NOTE — Progress Notes (Signed)
Patient ID: Tyler Sims, male   DOB: 04/17/1956, 58 y.o.   MRN: [id number] E-Cardio 24 hour holter monitor applied to patient.

## 2014-04-10 NOTE — Progress Notes (Signed)
Patient ID: Tyler Sims, male   DOB: [birth year], 58 y.o.   MRN: [id number] E-Cardio 24 hour holter monitor applied to patient.

## 2014-04-11 ENCOUNTER — Other Ambulatory Visit: Payer: Self-pay | Admitting: Internal Medicine

## 2014-04-16 ENCOUNTER — Ambulatory Visit (INDEPENDENT_AMBULATORY_CARE_PROVIDER_SITE_OTHER): Payer: Managed Care, Other (non HMO) | Admitting: General Practice

## 2014-04-16 DIAGNOSIS — Z5181 Encounter for therapeutic drug level monitoring: Secondary | ICD-10-CM

## 2014-04-16 DIAGNOSIS — I6350 Cerebral infarction due to unspecified occlusion or stenosis of unspecified cerebral artery: Secondary | ICD-10-CM

## 2014-04-16 DIAGNOSIS — I639 Cerebral infarction, unspecified: Secondary | ICD-10-CM

## 2014-04-16 DIAGNOSIS — I4891 Unspecified atrial fibrillation: Secondary | ICD-10-CM

## 2014-04-16 DIAGNOSIS — Z7901 Long term (current) use of anticoagulants: Secondary | ICD-10-CM

## 2014-04-16 DIAGNOSIS — Z8673 Personal history of transient ischemic attack (TIA), and cerebral infarction without residual deficits: Secondary | ICD-10-CM

## 2014-04-16 LAB — POCT INR: INR: 2.1

## 2014-04-16 NOTE — Progress Notes (Signed)
Pre visit review using our clinic review tool, if applicable. No additional management support is needed unless otherwise documented below in the visit note. 

## 2014-04-17 ENCOUNTER — Other Ambulatory Visit (INDEPENDENT_AMBULATORY_CARE_PROVIDER_SITE_OTHER): Payer: Managed Care, Other (non HMO)

## 2014-04-17 ENCOUNTER — Other Ambulatory Visit: Payer: Self-pay | Admitting: Internal Medicine

## 2014-04-17 DIAGNOSIS — Z0289 Encounter for other administrative examinations: Secondary | ICD-10-CM

## 2014-04-17 DIAGNOSIS — G609 Hereditary and idiopathic neuropathy, unspecified: Secondary | ICD-10-CM

## 2014-04-17 DIAGNOSIS — I6350 Cerebral infarction due to unspecified occlusion or stenosis of unspecified cerebral artery: Secondary | ICD-10-CM

## 2014-04-17 LAB — SEDIMENTATION RATE: Sed Rate: 3 mm/hr (ref 0–30)

## 2014-04-18 ENCOUNTER — Encounter: Payer: Managed Care, Other (non HMO) | Admitting: Internal Medicine

## 2014-04-18 LAB — LIPID PANEL
Chol/HDL Ratio: 2.6 ratio units (ref 0.0–5.0)
Cholesterol, Total: 226 mg/dL — ABNORMAL HIGH (ref 100–199)
HDL: 87 mg/dL (ref >39)
LDL Calculated: 126 mg/dL — ABNORMAL HIGH (ref 0–99)
Triglycerides: 64 mg/dL (ref 0–149)
VLDL Cholesterol Cal: 13 mg/dL (ref 5–40)

## 2014-04-18 LAB — C-REACTIVE PROTEIN: CRP: 1.2 mg/L (ref 0.0–4.9)

## 2014-04-22 ENCOUNTER — Encounter: Payer: Self-pay | Admitting: Nurse Practitioner

## 2014-04-22 ENCOUNTER — Ambulatory Visit (INDEPENDENT_AMBULATORY_CARE_PROVIDER_SITE_OTHER): Payer: Managed Care, Other (non HMO) | Admitting: Nurse Practitioner

## 2014-04-22 VITALS — BP 136/73 | HR 54 | Ht 75.0 in | Wt 202.8 lb

## 2014-04-22 DIAGNOSIS — R209 Unspecified disturbances of skin sensation: Secondary | ICD-10-CM

## 2014-04-22 DIAGNOSIS — R208 Other disturbances of skin sensation: Secondary | ICD-10-CM

## 2014-04-22 NOTE — Patient Instructions (Signed)
Start Atorvastatin 20 mg daily at bedtime.  If burning pain gets worse, we can try you on some Gabapentin or Lyrica, but the main side effects are tiredness, drowsiness.   Follow up in 6 months, sooner as needed.

## 2014-04-22 NOTE — Progress Notes (Signed)
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 Patient is in quite well with near complete recovery and full functional independence.   UPDATE 02/25/14 (LL): Patient returns for stroke follow up, last visit was with me on 08/22/13. Continues warfarin tolerating well, has had mild fluctuations of his INR. Carotid Doppler on 03/12/13 was negative. He states that BP is well controlled, in office today is 134/80. Patient denies medication side effects, with no signs of bleeding.He does not want to try any medication for it at this time. Recent lipid panel with total cholesterol 133, LDL 58, HDL 68, on Crestor. Patient is not diabetic. No new neurological symptoms, but complains of burning/sore sensation in his lower legs and feet, over last 1 year. Burning pain is intermittent, worst after standing on his feet at work all day. B12, TSH, and HgbA1c normal. Symptom was still present while on Pravachol.   UPDATE 04/22/14 (LL): Tried trial off statins for 6 weeks, with no change in symptoms, so not likely related to statin use. Dr. Graciela Husbands checked Sed rate and CRP, both normal.  Total cholesterol off statin rose to 226, LDL 126. Burning in lower legs worse when standing for long hours, does not prevent him from sleeping at night.   REVIEW OF SYSTEMS: Full 14 system review of systems performed and notable only for: fatigue, chest tightness, palpitations, legs burning.  ALLERGIES: Allergies  Allergen Reactions  . Lisinopril Rash  . Simvastatin Rash    HOME  MEDICATIONS: Outpatient Prescriptions Prior to Visit  Medication Sig Dispense Refill  . COD LIVER OIL PO Take by mouth daily.      . Coenzyme Q10 (CO Q-10) 200 MG CAPS Take 200 mg by mouth.      . losartan (COZAAR) 25 MG tablet TAKE 1 TABLET (25 MG TOTAL) BY MOUTH DAILY.  90 tablet  0  . metoprolol succinate (TOPROL-XL) 25 MG 24 hr tablet Take 1 tablet (25 mg total) by mouth daily. Take with or immediately following a meal.  30 tablet  4  . spironolactone (ALDACTONE) 25 MG tablet Take 1/2 Tablet daily      . warfarin (COUMADIN) 5 MG tablet Take as directed by anticoagulation clinic  40 tablet  3  . atorvastatin (LIPITOR) 20 MG tablet Take 1 tablet (20 mg total) by mouth daily at 6 PM.  30 tablet  1  . spironolactone (ALDACTONE) 25 MG tablet TAKE 1/2 TABLET BY MOUTH DAILY  30 tablet  0   No facility-administered medications prior to visit.    PHYSICAL EXAM Filed Vitals:   04/22/14 0823  BP: 136/73  Pulse: 54  Height: 6\' 3"  (1.905 m)  Weight: 202 lb 12.8 oz (91.989 kg)   Body mass index is 25.35 kg/(m^2).  Generalized: Well developed, in no acute distress  Head: normocephalic and atraumatic. Oropharynx benign  Neck: Supple, no carotid bruits  Cardiac: Regular rate rhythm, no murmur  Musculoskeletal: No deformity  Neurological examination  Mentation: Alert oriented to time, place, history taking. Follows all commands speech and language fluent  Cranial nerve II-XII: Fundoscopic exam reveals sharp disc  margins. Pupils were equal round reactive to light extraocular movements were full, visual field were full on confrontational test. Facial sensation and strength were normal. hearing was intact to finger rubbing bilaterally. Uvula tongue midline. head turning and shoulder shrug and were normal and symmetric.Tongue protrusion into cheek strength was normal.  Motor: The motor testing reveals 5 over 5 strength of all 4 extremities. Good symmetric motor tone is noted throughout.  Sensory:  Sensory testing is intact to soft touch on all 4 extremities. No evidence of extinction is noted. No sensory loss noted in lower extremities. Coordination: Cerebellar testing reveals good finger-nose-finger and heel-to-shin bilaterally.  Gait and station: Gait is normal. Tandem gait is normal. Romberg is negative.  Reflexes: Deep tendon reflexes are symmetric and normal bilaterally.   DIAGNOSTIC DATA (LABS, IMAGING, TESTING) - I reviewed patient records, labs, notes, testing and imaging myself where available.  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 Vascular risk factors include atrial fibrillation, hypertension, carotid artery disease, and hyperlipidemia.   New problem of lower leg burning and soreness over last 1 year, B12, TSH, and HgbA1c normal. Tried trial off statins for 6 weeks, with no change in symptoms, so not likely related to statin use. Dr. Graciela Husbands checked Sed rate and CRP, both normal.  Total cholesterol off statin rose to 226, LDL 126. Start Atorvastatin 20 mg daily at bedtime. If burning pain gets worse, we can try Gabapentin or Lyrica, but the main side effects are tiredness, drowsiness, which he states he has enough of already.  Continue Coumadin for atrial fibrillation and secondary stroke prevention and maintain strict control of hypertension with blood pressure goal below 130/90,and lipids with LDL cholesterol goal below 100 mg/dL.  Follow  up in 6 months, sooner as needed.  Tyler Asal Franck Vinal, MSN, FNP-BC, A/GNP-C 04/22/2014, 8:37 AM [facility] [address], [address] [place], [place] [address] [phone]  Note: This document was prepared with digital dictation and possible smart phrase technology. Any transcriptional errors that result from this process are unintentional.

## 2014-04-24 NOTE — Progress Notes (Signed)
I agree with the above plan 

## 2014-05-08 ENCOUNTER — Other Ambulatory Visit: Payer: Self-pay | Admitting: Internal Medicine

## 2014-05-14 ENCOUNTER — Ambulatory Visit (INDEPENDENT_AMBULATORY_CARE_PROVIDER_SITE_OTHER): Payer: Managed Care, Other (non HMO) | Admitting: *Deleted

## 2014-05-14 DIAGNOSIS — I6350 Cerebral infarction due to unspecified occlusion or stenosis of unspecified cerebral artery: Secondary | ICD-10-CM

## 2014-05-14 DIAGNOSIS — Z8673 Personal history of transient ischemic attack (TIA), and cerebral infarction without residual deficits: Secondary | ICD-10-CM

## 2014-05-14 DIAGNOSIS — Z7901 Long term (current) use of anticoagulants: Secondary | ICD-10-CM

## 2014-05-14 DIAGNOSIS — I4891 Unspecified atrial fibrillation: Secondary | ICD-10-CM

## 2014-05-14 DIAGNOSIS — Z5181 Encounter for therapeutic drug level monitoring: Secondary | ICD-10-CM

## 2014-05-14 DIAGNOSIS — I639 Cerebral infarction, unspecified: Secondary | ICD-10-CM

## 2014-05-14 LAB — POCT INR: INR: 2.2

## 2014-05-30 ENCOUNTER — Ambulatory Visit (INDEPENDENT_AMBULATORY_CARE_PROVIDER_SITE_OTHER): Payer: Managed Care, Other (non HMO) | Admitting: Family Medicine

## 2014-05-30 ENCOUNTER — Ambulatory Visit: Payer: Managed Care, Other (non HMO) | Admitting: Family Medicine

## 2014-05-30 ENCOUNTER — Encounter: Payer: Self-pay | Admitting: Family Medicine

## 2014-05-30 VITALS — BP 128/78 | Ht 75.0 in

## 2014-05-30 DIAGNOSIS — Z8673 Personal history of transient ischemic attack (TIA), and cerebral infarction without residual deficits: Secondary | ICD-10-CM

## 2014-05-30 DIAGNOSIS — R5381 Other malaise: Secondary | ICD-10-CM

## 2014-05-30 DIAGNOSIS — I4891 Unspecified atrial fibrillation: Secondary | ICD-10-CM

## 2014-05-30 DIAGNOSIS — I498 Other specified cardiac arrhythmias: Secondary | ICD-10-CM

## 2014-05-30 DIAGNOSIS — R001 Bradycardia, unspecified: Secondary | ICD-10-CM

## 2014-05-30 DIAGNOSIS — R5383 Other fatigue: Secondary | ICD-10-CM

## 2014-05-30 DIAGNOSIS — E785 Hyperlipidemia, unspecified: Secondary | ICD-10-CM

## 2014-05-30 DIAGNOSIS — I10 Essential (primary) hypertension: Secondary | ICD-10-CM

## 2014-05-30 DIAGNOSIS — Z23 Encounter for immunization: Secondary | ICD-10-CM

## 2014-05-30 NOTE — Progress Notes (Signed)
Patient ID: Tyler Sims, male   DOB: 1956/04/30, 58 y.o.   MRN: [id number] Eliberto Sole [id number] Jun 20, 1956 05/30/2014      Progress Note-Follow Up  Subjective  Chief Complaint  Chief Complaint  Patient presents with  . Establish Care    new patient  . Injections    flu    HPI  Patient is a 58 year old male in today for routine medical care. He is in today to establish care doing well. He has very infrequent palpitations but nothing recently. No chest pain or shortness of breath. Bi recent illness. Denies CP/palp/SOB/HA/congestion/fevers/GI or GU c/o. Taking meds as prescribed  Past Medical History  Diagnosis Date  . Stroke 08/24/2010  . Systolic CHF   . Persistent atrial fibrillation 08/24/2010  . NICM (nonischemic cardiomyopathy)   . Bradycardia     nocturnal  . Ventricular tachycardia, non-sustained   . Mitral regurgitation   . Varicella   . Mumps   . Measles (roseola)   . Abnormal transaminases   . Acid reflux   . Hypertension   . Colon polyps 02/07/2012    Dr. Silvano Rusk    Past Surgical History  Procedure Laterality Date  . Cardiac catheterization    . Colonoscopy w/ biopsies  02/07/2012    Dr. Silvano Rusk  . Cardioversion  08/15/2012    Procedure: CARDIOVERSION;  Surgeon: Deboraha Sprang, MD;  Location: [facility] ENDOSCOPY;  Service: Cardiovascular;  Laterality: N/A;    Family History  Problem Relation Age of Onset  . Alzheimer's disease Mother   . Lung cancer Brother     died from  . Arrhythmia Brother   . Hyperlipidemia Brother   . Hypertension Sister     X62  . Heart disease Father     CAD/MI  . Hypertension Father   . Mental illness Father     h/o depression requiring hospitalization  . Hyperlipidemia Sister   . Hypertension Brother     X3  . Heart disease Brother     cardiomyopathy, a. fib  . Colon polyps Brother     History   Social History  . Marital Status: Married    Spouse Name: Charlett Nose    Number of Children: 2  . Years of  Education: 16   Occupational History  . Nature conservation officer   .     Social History Main Topics  . Smoking status: Never Smoker   . Smokeless tobacco: Never Used  . Alcohol Use: No  . Drug Use: No  . Sexual Activity: Yes    Partners: Female   Other Topics Concern  . Not on file   Social History Narrative   [employer], [place]. Married '82 - 2 dtrs '85, '91. Work - Location manager -Chief Financial Officer. Marriage in good health.     Current Outpatient Prescriptions on File Prior to Visit  Medication Sig Dispense Refill  . atorvastatin (LIPITOR) 20 MG tablet Take 1 tablet (20 mg total) by mouth daily at 6 PM.  30 tablet  1  . COD LIVER OIL PO Take by mouth daily.      . Coenzyme Q10 (CO Q-10) 200 MG CAPS Take 200 mg by mouth.      . losartan (COZAAR) 25 MG tablet TAKE 1 TABLET (25 MG TOTAL) BY MOUTH DAILY.  90 tablet  0  . spironolactone (ALDACTONE) 25 MG tablet TAKE 1/2 TABLET BY MOUTH DAILY  30 tablet  0  . warfarin (COUMADIN) 5 MG tablet  Take as directed by anticoagulation clinic  40 tablet  3   No current facility-administered medications on file prior to visit.    Allergies  Allergen Reactions  . Lisinopril Rash  . Simvastatin Rash    Review of Systems  Review of Systems  Constitutional: Negative for fever and malaise/fatigue.  HENT: Negative for congestion.   Eyes: Negative for discharge and redness.  Respiratory: Negative for shortness of breath.   Cardiovascular: Negative for chest pain, palpitations and leg swelling.  Gastrointestinal: Negative for nausea, abdominal pain and diarrhea.  Genitourinary: Negative for dysuria.  Musculoskeletal: Negative for falls.  Skin: Negative for rash.  Neurological: Negative for tingling, loss of consciousness and headaches.  Endo/Heme/Allergies: Negative for polydipsia.  Psychiatric/Behavioral: Negative for depression and suicidal ideas. The patient is not nervous/anxious and does not have insomnia.     Objective  BP 169/104  Ht  6\' 3"  (1.905 m)  Physical Exam  Physical Exam  Constitutional: He is oriented to person, place, and time and well-developed, well-nourished, and in no distress. No distress.  HENT:  Head: Normocephalic and atraumatic.  Eyes: Conjunctivae are normal.  Neck: Neck supple. No thyromegaly present.  Cardiovascular: Normal rate, regular rhythm and normal heart sounds.   No murmur heard. Pulmonary/Chest: Effort normal and breath sounds normal. No respiratory distress.  Abdominal: He exhibits no distension and no mass. There is no tenderness.  Musculoskeletal: He exhibits no edema.  Neurological: He is alert and oriented to person, place, and time.  Skin: Skin is warm.  Psychiatric: Memory, affect and judgment normal.    Lab Results  Component Value Date   TSH 1.09 12/03/2013   Lab Results  Component Value Date   WBC 5.8 02/05/2013   HGB 13.1 12/03/2013   HCT 39.2 12/03/2013   MCV 93.9 02/05/2013   PLT 194.0 02/05/2013   Lab Results  Component Value Date   CREATININE 0.9 12/03/2013   BUN 20 12/03/2013   NA 136 12/03/2013   K 4.6 12/03/2013   CL 102 12/03/2013   CO2 27 12/03/2013   Lab Results  Component Value Date   ALT 22 12/03/2013   AST 34 12/03/2013   ALKPHOS 54 12/03/2013   BILITOT 1.3* 12/03/2013   Lab Results  Component Value Date   CHOL 133 12/03/2013   Lab Results  Component Value Date   HDL 87 04/17/2014   Lab Results  Component Value Date   LDLCALC 126* 04/17/2014   Lab Results  Component Value Date   TRIG 64 04/17/2014   Lab Results  Component Value Date   CHOLHDL 2.6 04/17/2014     Assessment & Plan   Sinus bradycardia Regular rate today  Hyperlipidemia Encouraged heart healthy diet, increase exercise, avoid trans fats, consider a krill oil cap daily  Atrial fibrillation Rate controlled, tolerating warfarin  History of CVA (cerebrovascular accident) without residual deficits Follows with Dr Leonie Man no recent new concerns  Fatigue Likely  multifactorial, need 8 hours of sleep, regular exercise, hi quality diet  HTN (hypertension) Improved on recheck. Well controlled, no changes to meds. Encouraged heart healthy diet such as the DASH diet and exercise as tolerated.

## 2014-05-30 NOTE — Patient Instructions (Addendum)
Consider Biofreeze or Capsaicin cream  Peripheral Neuropathy Peripheral neuropathy is a type of nerve damage. It affects nerves that carry signals between the spinal cord and other parts of the body. These are called peripheral nerves. With peripheral neuropathy, one nerve or a group of nerves may be damaged.  CAUSES  Many things can damage peripheral nerves. For some people with peripheral neuropathy, the cause is unknown. Some causes include:  Diabetes. This is the most common cause of peripheral neuropathy.  Injury to a nerve.  Pressure or stress on a nerve that lasts a long time.  Too little vitamin B. Alcoholism can lead to this.  Infections.  Autoimmune diseases, such as multiple sclerosis and systemic lupus erythematosus.  Inherited nerve diseases.  Some medicines, such as cancer drugs.  Toxic substances, such as lead and mercury.  Too little blood flowing to the legs.  Kidney disease.  Thyroid disease. SIGNS AND SYMPTOMS  Different people have different symptoms. The symptoms you have will depend on which of your nerves is damaged. Common symptoms include:  Loss of feeling (numbness) in the feet and hands.  Tingling in the feet and hands.  Pain that burns.  Very sensitive skin.  Weakness.  Not being able to move a part of the body (paralysis).  Muscle twitching.  Clumsiness or poor coordination.  Loss of balance.  Not being able to control your bladder.  Feeling dizzy.  Sexual problems. DIAGNOSIS  Peripheral neuropathy is a symptom, not a disease. Finding the cause of peripheral neuropathy can be hard. To figure that out, your health care provider will take a medical history and do a physical exam. A neurological exam will also be done. This involves checking things affected by your brain, spinal cord, and nerves (nervous system). For example, your health care provider will check your reflexes, how you move, and what you can feel.  Other types of  tests may also be ordered, such as:  Blood tests.  A test of the fluid in your spinal cord.  Imaging tests, such as CT scans or an MRI.  Electromyography (EMG). This test checks the nerves that control muscles.  Nerve conduction velocity tests. These tests check how fast messages pass through your nerves.  Nerve biopsy. A small piece of nerve is removed. It is then checked under a microscope. TREATMENT   Medicine is often used to treat peripheral neuropathy. Medicines may include:  Pain-relieving medicines. Prescription or over-the-counter medicine may be suggested.  Antiseizure medicine. This may be used for pain.  Antidepressants. These also may help ease pain from neuropathy.  Lidocaine. This is a numbing medicine. You might wear a patch or be given a shot.  Mexiletine. This medicine is typically used to help control irregular heart rhythms.  Surgery. Surgery may be needed to relieve pressure on a nerve or to destroy a nerve that is causing pain.  Physical therapy to help movement.  Assistive devices to help movement. HOME CARE INSTRUCTIONS   Only take over-the-counter or prescription medicines as directed by your health care provider. Follow the instructions carefully for any given medicines. Do not take any other medicines without first getting approval from your health care provider.  If you have diabetes, work closely with your health care provider to keep your blood sugar under control.  If you have numbness in your feet:  Check every day for signs of injury or infection. Watch for redness, warmth, and swelling.  Wear padded socks and comfortable shoes. These help protect  your feet.  Do not do things that put pressure on your damaged nerve.  Do not smoke. Smoking keeps blood from getting to damaged nerves.  Avoid or limit alcohol. Too much alcohol can cause a lack of B vitamins. These vitamins are needed for healthy nerves.  Develop a good support system.  Coping with peripheral neuropathy can be stressful. Talk to a mental health specialist or join a support group if you are struggling.  Follow up with your health care provider as directed. SEEK MEDICAL CARE IF:   You have new signs or symptoms of peripheral neuropathy.  You are struggling emotionally from dealing with peripheral neuropathy.  You have a fever. SEEK IMMEDIATE MEDICAL CARE IF:   You have an injury or infection that is not healing.  You feel very dizzy or begin vomiting.  You have chest pain.  You have trouble breathing. Document Released: 08/26/2002 Document Revised: 05/18/2011 Document Reviewed: 05/13/2013 [facility] Patient Information 2015 [place], [place]. This information is not intended to replace advice given to you by your health care provider. Make sure you discuss any questions you have with your health care provider.

## 2014-05-30 NOTE — Progress Notes (Signed)
Pre visit review using our clinic review tool, if applicable. No additional management support is needed unless otherwise documented below in the visit note. 

## 2014-06-02 ENCOUNTER — Encounter: Payer: Self-pay | Admitting: Family Medicine

## 2014-06-02 DIAGNOSIS — I10 Essential (primary) hypertension: Secondary | ICD-10-CM

## 2014-06-02 HISTORY — DX: Essential (primary) hypertension: I10

## 2014-06-02 NOTE — Assessment & Plan Note (Addendum)
Regular rate today

## 2014-06-02 NOTE — Assessment & Plan Note (Signed)
Follows with Dr Leonie Man no recent new concerns

## 2014-06-02 NOTE — Assessment & Plan Note (Signed)
Rate controlled, tolerating warfarin

## 2014-06-02 NOTE — Assessment & Plan Note (Signed)
Likely multifactorial, need 8 hours of sleep, regular exercise, hi quality diet

## 2014-06-02 NOTE — Assessment & Plan Note (Signed)
Encouraged heart healthy diet, increase exercise, avoid trans fats, consider a krill oil cap daily 

## 2014-06-02 NOTE — Assessment & Plan Note (Signed)
Improved on recheck. Well controlled, no changes to meds. Encouraged heart healthy diet such as the DASH diet and exercise as tolerated.  

## 2014-06-09 ENCOUNTER — Other Ambulatory Visit: Payer: Self-pay | Admitting: Nurse Practitioner

## 2014-06-10 ENCOUNTER — Other Ambulatory Visit: Payer: Self-pay | Admitting: Family Medicine

## 2014-06-10 NOTE — Telephone Encounter (Signed)
Please advise 

## 2014-06-11 ENCOUNTER — Ambulatory Visit (INDEPENDENT_AMBULATORY_CARE_PROVIDER_SITE_OTHER): Payer: Managed Care, Other (non HMO) | Admitting: *Deleted

## 2014-06-11 ENCOUNTER — Other Ambulatory Visit: Payer: Self-pay | Admitting: Nurse Practitioner

## 2014-06-11 ENCOUNTER — Other Ambulatory Visit: Payer: Self-pay | Admitting: Internal Medicine

## 2014-06-11 DIAGNOSIS — Z5181 Encounter for therapeutic drug level monitoring: Secondary | ICD-10-CM

## 2014-06-11 DIAGNOSIS — I639 Cerebral infarction, unspecified: Secondary | ICD-10-CM

## 2014-06-11 DIAGNOSIS — Z7901 Long term (current) use of anticoagulants: Secondary | ICD-10-CM

## 2014-06-11 DIAGNOSIS — I6350 Cerebral infarction due to unspecified occlusion or stenosis of unspecified cerebral artery: Secondary | ICD-10-CM

## 2014-06-11 DIAGNOSIS — I4891 Unspecified atrial fibrillation: Secondary | ICD-10-CM

## 2014-06-11 DIAGNOSIS — Z8673 Personal history of transient ischemic attack (TIA), and cerebral infarction without residual deficits: Secondary | ICD-10-CM

## 2014-06-11 LAB — POCT INR: INR: 1.8

## 2014-07-04 ENCOUNTER — Other Ambulatory Visit: Payer: Self-pay

## 2014-07-04 ENCOUNTER — Other Ambulatory Visit: Payer: Self-pay | Admitting: Internal Medicine

## 2014-07-04 MED ORDER — METOPROLOL SUCCINATE ER 50 MG PO TB24: 50.0000 mg | ORAL_TABLET | Freq: Every day | ORAL | Status: DC

## 2014-07-09 ENCOUNTER — Ambulatory Visit (INDEPENDENT_AMBULATORY_CARE_PROVIDER_SITE_OTHER): Payer: Managed Care, Other (non HMO) | Admitting: *Deleted

## 2014-07-09 DIAGNOSIS — Z5181 Encounter for therapeutic drug level monitoring: Secondary | ICD-10-CM

## 2014-07-09 DIAGNOSIS — I639 Cerebral infarction, unspecified: Secondary | ICD-10-CM

## 2014-07-09 DIAGNOSIS — Z8673 Personal history of transient ischemic attack (TIA), and cerebral infarction without residual deficits: Secondary | ICD-10-CM

## 2014-07-09 DIAGNOSIS — I4891 Unspecified atrial fibrillation: Secondary | ICD-10-CM

## 2014-07-09 DIAGNOSIS — Z7901 Long term (current) use of anticoagulants: Secondary | ICD-10-CM

## 2014-07-09 LAB — POCT INR: INR: 1.8

## 2014-08-06 ENCOUNTER — Ambulatory Visit (INDEPENDENT_AMBULATORY_CARE_PROVIDER_SITE_OTHER): Payer: Managed Care, Other (non HMO)

## 2014-08-06 DIAGNOSIS — Z5181 Encounter for therapeutic drug level monitoring: Secondary | ICD-10-CM

## 2014-08-06 DIAGNOSIS — I4891 Unspecified atrial fibrillation: Secondary | ICD-10-CM

## 2014-08-06 DIAGNOSIS — Z7901 Long term (current) use of anticoagulants: Secondary | ICD-10-CM

## 2014-08-06 DIAGNOSIS — I639 Cerebral infarction, unspecified: Secondary | ICD-10-CM

## 2014-08-06 DIAGNOSIS — Z8673 Personal history of transient ischemic attack (TIA), and cerebral infarction without residual deficits: Secondary | ICD-10-CM

## 2014-08-06 LAB — POCT INR: INR: 2.5

## 2014-08-10 ENCOUNTER — Other Ambulatory Visit: Payer: Self-pay | Admitting: Internal Medicine

## 2014-09-03 ENCOUNTER — Ambulatory Visit (INDEPENDENT_AMBULATORY_CARE_PROVIDER_SITE_OTHER): Payer: Managed Care, Other (non HMO)

## 2014-09-03 DIAGNOSIS — Z5181 Encounter for therapeutic drug level monitoring: Secondary | ICD-10-CM

## 2014-09-03 DIAGNOSIS — I639 Cerebral infarction, unspecified: Secondary | ICD-10-CM

## 2014-09-03 DIAGNOSIS — I4891 Unspecified atrial fibrillation: Secondary | ICD-10-CM

## 2014-09-03 DIAGNOSIS — Z7901 Long term (current) use of anticoagulants: Secondary | ICD-10-CM

## 2014-09-03 DIAGNOSIS — Z8673 Personal history of transient ischemic attack (TIA), and cerebral infarction without residual deficits: Secondary | ICD-10-CM

## 2014-09-03 LAB — POCT INR: INR: 2.5

## 2014-09-18 ENCOUNTER — Other Ambulatory Visit: Payer: Self-pay | Admitting: Internal Medicine

## 2014-10-01 ENCOUNTER — Ambulatory Visit (INDEPENDENT_AMBULATORY_CARE_PROVIDER_SITE_OTHER): Payer: Managed Care, Other (non HMO) | Admitting: Family Medicine

## 2014-10-01 DIAGNOSIS — Z8673 Personal history of transient ischemic attack (TIA), and cerebral infarction without residual deficits: Secondary | ICD-10-CM

## 2014-10-01 DIAGNOSIS — I639 Cerebral infarction, unspecified: Secondary | ICD-10-CM

## 2014-10-01 DIAGNOSIS — I4891 Unspecified atrial fibrillation: Secondary | ICD-10-CM

## 2014-10-01 DIAGNOSIS — Z5181 Encounter for therapeutic drug level monitoring: Secondary | ICD-10-CM

## 2014-10-01 DIAGNOSIS — Z7901 Long term (current) use of anticoagulants: Secondary | ICD-10-CM

## 2014-10-01 LAB — POCT INR: INR: 2.6

## 2014-10-11 ENCOUNTER — Other Ambulatory Visit: Payer: Self-pay | Admitting: Family Medicine

## 2014-10-12 ENCOUNTER — Other Ambulatory Visit: Payer: Self-pay | Admitting: Neurology

## 2014-10-17 ENCOUNTER — Telehealth: Payer: Self-pay | Admitting: Family Medicine

## 2014-10-17 NOTE — Telephone Encounter (Signed)
Patient has had charges for code [phone] and [phone] sent to insurance under Dr. Linda Hedges on 1/13.  He is requesting this to be changed and a follow up call.

## 2014-11-12 ENCOUNTER — Ambulatory Visit (INDEPENDENT_AMBULATORY_CARE_PROVIDER_SITE_OTHER): Payer: Managed Care, Other (non HMO) | Admitting: General Practice

## 2014-11-12 DIAGNOSIS — Z8673 Personal history of transient ischemic attack (TIA), and cerebral infarction without residual deficits: Secondary | ICD-10-CM

## 2014-11-12 DIAGNOSIS — Z5181 Encounter for therapeutic drug level monitoring: Secondary | ICD-10-CM

## 2014-11-12 DIAGNOSIS — Z7901 Long term (current) use of anticoagulants: Secondary | ICD-10-CM

## 2014-11-12 DIAGNOSIS — I4891 Unspecified atrial fibrillation: Secondary | ICD-10-CM

## 2014-11-12 DIAGNOSIS — I639 Cerebral infarction, unspecified: Secondary | ICD-10-CM

## 2014-11-12 LAB — POCT INR: INR: 2

## 2014-11-12 NOTE — Progress Notes (Signed)
Agree with plan 

## 2014-11-12 NOTE — Progress Notes (Signed)
Pre visit review using our clinic review tool, if applicable. No additional management support is needed unless otherwise documented below in the visit note. 

## 2014-11-17 ENCOUNTER — Ambulatory Visit: Payer: Managed Care, Other (non HMO) | Admitting: Neurology

## 2014-12-01 ENCOUNTER — Encounter: Payer: Self-pay | Admitting: Neurology

## 2014-12-01 ENCOUNTER — Ambulatory Visit (INDEPENDENT_AMBULATORY_CARE_PROVIDER_SITE_OTHER): Payer: Managed Care, Other (non HMO) | Admitting: Neurology

## 2014-12-01 VITALS — BP 147/87 | HR 63 | Ht 75.0 in | Wt 212.6 lb

## 2014-12-01 DIAGNOSIS — I639 Cerebral infarction, unspecified: Secondary | ICD-10-CM

## 2014-12-01 DIAGNOSIS — I6340 Cerebral infarction due to embolism of unspecified cerebral artery: Secondary | ICD-10-CM

## 2014-12-01 DIAGNOSIS — G629 Polyneuropathy, unspecified: Secondary | ICD-10-CM

## 2014-12-01 MED ORDER — GABAPENTIN 300 MG PO CAPS: 300.0000 mg | ORAL_CAPSULE | Freq: Three times a day (TID) | ORAL | Status: DC

## 2014-12-01 NOTE — Patient Instructions (Signed)
I had a long d/w patient about his remote stroke, risk for recurrent stroke/TIAs, personally independently reviewed imaging studies and stroke evaluation results and answered questions.Continue warfarin  for secondary stroke prevention and maintain strict control of hypertension with blood pressure goal below 130/90, diabetes with hemoglobin A1c goal below 6.5% and lipids with LDL cholesterol goal below 100 mg/dL. I also advised the patient to eat a healthy diet with plenty of whole grains, cereals, fruits and vegetables, exercise regularly and maintain ideal body weight. Check neuropathy panel labs and trial of gabapentin 300 mg twice daily for 1 week increase if tolerated to 3 times daily for neuropathic pain. Check screening carotid ultrasound study. Followup in the future with me in 3 months or call earlier if necessary.  Peripheral Neuropathy Peripheral neuropathy is a type of nerve damage. It affects nerves that carry signals between the spinal cord and other parts of the body. These are called peripheral nerves. With peripheral neuropathy, one nerve or a group of nerves may be damaged.  CAUSES  Many things can damage peripheral nerves. For some people with peripheral neuropathy, the cause is unknown. Some causes include:  Diabetes. This is the most common cause of peripheral neuropathy.  Injury to a nerve.  Pressure or stress on a nerve that lasts a long time.  Too little vitamin B. Alcoholism can lead to this.  Infections.  Autoimmune diseases, such as multiple sclerosis and systemic lupus erythematosus.  Inherited nerve diseases.  Some medicines, such as cancer drugs.  Toxic substances, such as lead and mercury.  Too little blood flowing to the legs.  Kidney disease.  Thyroid disease. SIGNS AND SYMPTOMS  Different people have different symptoms. The symptoms you have will depend on which of your nerves is damaged. Common symptoms include:  Loss of feeling (numbness) in the  feet and hands.  Tingling in the feet and hands.  Pain that burns.  Very sensitive skin.  Weakness.  Not being able to move a part of the body (paralysis).  Muscle twitching.  Clumsiness or poor coordination.  Loss of balance.  Not being able to control your bladder.  Feeling dizzy.  Sexual problems. DIAGNOSIS  Peripheral neuropathy is a symptom, not a disease. Finding the cause of peripheral neuropathy can be hard. To figure that out, your health care provider will take a medical history and do a physical exam. A neurological exam will also be done. This involves checking things affected by your brain, spinal cord, and nerves (nervous system). For example, your health care provider will check your reflexes, how you move, and what you can feel.  Other types of tests may also be ordered, such as:  Blood tests.  A test of the fluid in your spinal cord.  Imaging tests, such as CT scans or an MRI.  Electromyography (EMG). This test checks the nerves that control muscles.  Nerve conduction velocity tests. These tests check how fast messages pass through your nerves.  Nerve biopsy. A small piece of nerve is removed. It is then checked under a microscope. TREATMENT   Medicine is often used to treat peripheral neuropathy. Medicines may include:  Pain-relieving medicines. Prescription or over-the-counter medicine may be suggested.  Antiseizure medicine. This may be used for pain.  Antidepressants. These also may help ease pain from neuropathy.  Lidocaine. This is a numbing medicine. You might wear a patch or be given a shot.  Mexiletine. This medicine is typically used to help control irregular heart rhythms.  Surgery.  Surgery may be needed to relieve pressure on a nerve or to destroy a nerve that is causing pain.  Physical therapy to help movement.  Assistive devices to help movement. HOME CARE INSTRUCTIONS   Only take over-the-counter or prescription medicines as  directed by your health care provider. Follow the instructions carefully for any given medicines. Do not take any other medicines without first getting approval from your health care provider.  If you have diabetes, work closely with your health care provider to keep your blood sugar under control.  If you have numbness in your feet:  Check every day for signs of injury or infection. Watch for redness, warmth, and swelling.  Wear padded socks and comfortable shoes. These help protect your feet.  Do not do things that put pressure on your damaged nerve.  Do not smoke. Smoking keeps blood from getting to damaged nerves.  Avoid or limit alcohol. Too much alcohol can cause a lack of B vitamins. These vitamins are needed for healthy nerves.  Develop a good support system. Coping with peripheral neuropathy can be stressful. Talk to a mental health specialist or join a support group if you are struggling.  Follow up with your health care provider as directed. SEEK MEDICAL CARE IF:   You have new signs or symptoms of peripheral neuropathy.  You are struggling emotionally from dealing with peripheral neuropathy.  You have a fever. SEEK IMMEDIATE MEDICAL CARE IF:   You have an injury or infection that is not healing.  You feel very dizzy or begin vomiting.  You have chest pain.  You have trouble breathing. Document Released: 08/26/2002 Document Revised: 05/18/2011 Document Reviewed: 05/13/2013 [facility] Patient Information 2015 [place], [place]. This information is not intended to replace advice given to you by your health care provider. Make sure you discuss any questions you have with your health care provider.

## 2014-12-01 NOTE — Progress Notes (Signed)
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 Patient is in quite well with near complete recovery and full functional independence.   UPDATE 02/25/14 (LL): Patient returns for stroke follow up, last visit was with me on 08/22/13. Continues warfarin tolerating well, has had mild fluctuations of his INR. Carotid Doppler on 03/12/13 was negative. He states that BP is well controlled, in office today is 134/80. Patient denies medication side effects, with no signs of bleeding.He does not want to try any medication for it at this time. Recent lipid panel with total cholesterol 133, LDL 58, HDL 68, on Crestor. Patient is not diabetic. No new neurological symptoms, but complains of burning/sore sensation in his lower legs and feet, over last 1 year. Burning pain is intermittent, worst after standing on his feet at work all day. B12, TSH, and HgbA1c normal. Symptom was still present while on Pravachol.   UPDATE 04/22/14 (LL): Tried trial off statins for 6 weeks, with no change in symptoms, so not likely related to statin use. Dr. Caryl Comes checked Sed rate and CRP, both normal.  Total cholesterol off statin rose to 226, LDL 126. Burning in lower legs worse when standing for long hours, does not prevent him from sleeping at night.  Update 12/01/2014 : He returns for follow-up after last visit 7 months ago. He continues to do well from stroke standpoint without any new neurovascular symptoms since his stroke in December 2011. He has made a full recovery and is back to working full-time  and has no disability from his stroke. He remains on warfarin which is tolerating well without significant bleeding or bruising. His INR has been quite stable recently. He is also tolerating atorvastatin without side effects and plans on having lipid profile checked next week with his primary physician. He continues to complain of tingling numbness and burning in his feet particularly on days when he's been on his feet for long periods of more noticeable when he is sitting down or lying down quietly. He was recommended a trial of gabapentin or Lyrica at last visit but chose not to take the medicines. He says that his sister has a severe peripheral neuropathy and he is now worried and would like evaluation for this. He denies significant problems with balance or falls or weakness in his muscles in the legs. REVIEW OF SYSTEMS: Full 14 system review of systems performed and notable only for: fatigue,  , palpitations, legs burning, tingling and pain.Marland Kitchen  ALLERGIES: Allergies  Allergen Reactions  . Lisinopril Rash  . Simvastatin Rash    HOME MEDICATIONS: Outpatient Prescriptions Prior to Visit  Medication Sig Dispense Refill  . atorvastatin (LIPITOR) 20 MG tablet TAKE 1 TABLET BY MOUTH ONCE DAILY AT 6PM 90 tablet 0  . COD LIVER OIL PO Take by mouth daily.    . Coenzyme Q10 (CO Q-10) 200 MG CAPS Take 200 mg by mouth.    . losartan (COZAAR) 25 MG tablet TAKE 1 TABLET BY MOUTH EVERY DAY NEED APPT 30 tablet 7  . metoprolol succinate (TOPROL-XL) 50 MG 24 hr tablet Take 1 tablet (50 mg  total) by mouth daily. Take with or immediately following a meal. 90 tablet 3  . spironolactone (ALDACTONE) 25 MG tablet TAKE 1/2 TABLET BY MOUTH DAILY 30 tablet 0  . warfarin (COUMADIN) 5 MG tablet TAKE AS DIRECTED BY ANTICOAGULATION CLINIC 40 tablet 2   No facility-administered medications prior to visit.    PHYSICAL EXAM Filed Vitals:   12/01/14 0841  BP: 147/87  Pulse: 63  Height: 6\' 3"  (1.905 m)  Weight: 212 lb  9.6 oz (96.435 kg)   Body mass index is 26.57 kg/(m^2).  Generalized: Well developed, in no acute distress  Head: normocephalic and atraumatic. Oropharynx benign  Neck: Supple, no carotid bruits  Cardiac: Regular rate rhythm, no murmur  Musculoskeletal: No deformity  Neurological examination  Mentation: Alert oriented to time, place, history taking. Follows all commands speech and language fluent  Cranial nerve II-XII: Fundoscopic exam not done margins. Pupils were equal round reactive to light extraocular movements were full, visual field were full on confrontational test. Facial sensation and strength were normal. hearing was intact to finger rubbing bilaterally. Uvula tongue midline. head turning and shoulder shrug and were normal and symmetric.Tongue protrusion into cheek strength was normal.  Motor: The motor testing reveals 5 over 5 strength of all 4 extremities. Good symmetric motor tone is noted throughout.  Sensory: Sensory testing is intact to soft touch on all 4 extremities. No evidence of extinction is noted. Diminished touch and pinprick sensation in both feet from ankle down. Diminished vibration sensation over toes bilaterally. Normal position sense. Romberg sign negative.  Coordination: Cerebellar testing reveals good finger-nose-finger and heel-to-shin bilaterally.  Gait and station: Gait is normal. Tandem gait is normal. Romberg is negative.  Reflexes: Deep tendon reflexes are symmetric and normal bilaterally except ankle jerks are depressed.Marland Kitchen  NIH SS 0. Modified Rankin scale 1. DIAGNOSTIC DATA (LABS, IMAGING, TESTING) - I reviewed patient records, labs, notes, testing and imaging myself where available.  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 Vascular risk factors include atrial fibrillation, hypertension, carotid artery disease, and hyperlipidemia.   New problem of lower leg burning and soreness over last 1 1/2 yeas, likely peripheral neuropathy. Tried trial off statins for 6 weeks, with no change in symptoms, so not likely related to statin use.   PLAN : I had a long d/w patient about his remote stroke, risk for recurrent stroke/TIAs, personally independently reviewed imaging studies and stroke evaluation results and answered questions.Continue warfarin  for secondary stroke prevention and maintain strict control of hypertension with blood pressure goal below 130/90, diabetes with hemoglobin A1c goal below 6.5% and lipids with LDL cholesterol goal below 100 mg/dL. I also advised the patient to eat a healthy diet with plenty of whole grains, cereals, fruits and vegetables, exercise regularly and maintain ideal body weight. Check neuropathy panel labs and trial of gabapentin 300 mg twice daily for 1 week increase if tolerated to 3 times daily  for neuropathic pain. Check screening carotid ultrasound study. Followup in the future with me in 3 months or call earlier if necessary.   Antony Contras, MD  12/01/2014, 9:10 AM [facility] [address], [place], [place] [address] [phone]  Note: This document was prepared with digital dictation and possible smart phrase technology. Any transcriptional errors that result from this process are unintentional.

## 2014-12-02 ENCOUNTER — Other Ambulatory Visit: Payer: Self-pay | Admitting: Family Medicine

## 2014-12-02 LAB — NEUROPATHY PANEL
A/G Ratio: 1.2 (ref 0.7–2.0)
ALPHA 2: 0.5 g/dL (ref 0.4–1.2)
Albumin ELP: 4.1 g/dL (ref 3.2–5.6)
Alpha 1: 0.2 g/dL (ref 0.1–0.4)
Angio Convert Enzyme: 73 U/L (ref 14–82)
Anti Nuclear Antibody(ANA): NEGATIVE
Beta: 1.1 g/dL (ref 0.6–1.3)
GAMMA GLOBULIN: 1.5 g/dL (ref 0.5–1.6)
GLOBULIN, TOTAL: 3.4 g/dL (ref 2.0–4.5)
Rhuematoid fact SerPl-aCnc: 8.5 IU/mL (ref 0.0–13.9)
Sed Rate: 7 mm/hr (ref 0–30)
TSH: 2.74 u[IU]/mL (ref 0.450–4.500)
Total Protein: 7.5 g/dL (ref 6.0–8.5)
Vit D, 25-Hydroxy: 23.8 ng/mL — ABNORMAL LOW (ref 30.0–100.0)
Vitamin B-12: 615 pg/mL (ref 211–946)

## 2014-12-02 MED ORDER — VITAMIN D (ERGOCALCIFEROL) 1.25 MG (50000 UNIT) PO CAPS: 50000.0000 [IU] | ORAL_CAPSULE | ORAL | Status: DC

## 2014-12-06 ENCOUNTER — Other Ambulatory Visit: Payer: Self-pay | Admitting: Internal Medicine

## 2014-12-09 ENCOUNTER — Ambulatory Visit (INDEPENDENT_AMBULATORY_CARE_PROVIDER_SITE_OTHER): Payer: Managed Care, Other (non HMO) | Admitting: Family Medicine

## 2014-12-09 ENCOUNTER — Encounter: Payer: Self-pay | Admitting: Family Medicine

## 2014-12-09 VITALS — BP 122/78 | HR 58 | Temp 98.1°F | Resp 16 | Wt 211.5 lb

## 2014-12-09 DIAGNOSIS — Z5181 Encounter for therapeutic drug level monitoring: Secondary | ICD-10-CM

## 2014-12-09 DIAGNOSIS — E785 Hyperlipidemia, unspecified: Secondary | ICD-10-CM | POA: Diagnosis not present

## 2014-12-09 DIAGNOSIS — I10 Essential (primary) hypertension: Secondary | ICD-10-CM

## 2014-12-09 DIAGNOSIS — E559 Vitamin D deficiency, unspecified: Secondary | ICD-10-CM

## 2014-12-09 DIAGNOSIS — R351 Nocturia: Secondary | ICD-10-CM

## 2014-12-09 DIAGNOSIS — I429 Cardiomyopathy, unspecified: Secondary | ICD-10-CM

## 2014-12-09 DIAGNOSIS — E782 Mixed hyperlipidemia: Secondary | ICD-10-CM

## 2014-12-09 DIAGNOSIS — R3911 Hesitancy of micturition: Secondary | ICD-10-CM

## 2014-12-09 DIAGNOSIS — I6350 Cerebral infarction due to unspecified occlusion or stenosis of unspecified cerebral artery: Secondary | ICD-10-CM

## 2014-12-09 DIAGNOSIS — I639 Cerebral infarction, unspecified: Secondary | ICD-10-CM

## 2014-12-09 DIAGNOSIS — G609 Hereditary and idiopathic neuropathy, unspecified: Secondary | ICD-10-CM

## 2014-12-09 LAB — COMPREHENSIVE METABOLIC PANEL
ALT: 19 U/L (ref 0–53)
AST: 25 U/L (ref 0–37)
Albumin: 4.3 g/dL (ref 3.5–5.2)
Alkaline Phosphatase: 65 U/L (ref 39–117)
BUN: 15 mg/dL (ref 6–23)
CHLORIDE: 102 meq/L (ref 96–112)
CO2: 30 mEq/L (ref 19–32)
Calcium: 9.5 mg/dL (ref 8.4–10.5)
Creatinine, Ser: 0.94 mg/dL (ref 0.40–1.50)
GFR: 106.26 mL/min (ref >60.00)
GLUCOSE: 87 mg/dL (ref 70–99)
Potassium: 4.4 mEq/L (ref 3.5–5.1)
Sodium: 136 mEq/L (ref 135–145)
Total Bilirubin: 1.3 mg/dL — ABNORMAL HIGH (ref 0.2–1.2)
Total Protein: 7.5 g/dL (ref 6.0–8.3)

## 2014-12-09 LAB — TSH: TSH: 1.16 u[IU]/mL (ref 0.35–4.50)

## 2014-12-09 LAB — CBC
HCT: 40.5 % (ref 39.0–52.0)
HEMOGLOBIN: 14.2 g/dL (ref 13.0–17.0)
MCHC: 35 g/dL (ref 30.0–36.0)
MCV: 92.4 fl (ref 78.0–100.0)
Platelets: 190 10*3/uL (ref 150.0–400.0)
RBC: 4.38 Mil/uL (ref 4.22–5.81)
RDW: 13.2 % (ref 11.5–15.5)
WBC: 4.3 10*3/uL (ref 4.0–10.5)

## 2014-12-09 LAB — LIPID PANEL
CHOLESTEROL: 147 mg/dL (ref 0–200)
HDL: 62.7 mg/dL (ref >39.00)
LDL Cholesterol: 73 mg/dL (ref 0–99)
NonHDL: 84.3
Total CHOL/HDL Ratio: 2
Triglycerides: 58 mg/dL (ref 0.0–149.0)
VLDL: 11.6 mg/dL (ref 0.0–40.0)

## 2014-12-09 LAB — PSA: PSA: 0.95 ng/mL (ref 0.10–4.00)

## 2014-12-09 MED ORDER — METOPROLOL SUCCINATE ER 25 MG PO TB24: 25.0000 mg | ORAL_TABLET | Freq: Every day | ORAL | Status: DC

## 2014-12-09 NOTE — Progress Notes (Signed)
Pre visit review using our clinic review tool, if applicable. No additional management support is needed unless otherwise documented below in the visit note. 

## 2014-12-09 NOTE — Patient Instructions (Signed)

## 2014-12-10 ENCOUNTER — Encounter: Payer: Self-pay | Admitting: General Practice

## 2014-12-21 ENCOUNTER — Encounter: Payer: Self-pay | Admitting: Family Medicine

## 2014-12-21 DIAGNOSIS — R3911 Hesitancy of micturition: Secondary | ICD-10-CM | POA: Insufficient documentation

## 2014-12-21 DIAGNOSIS — E559 Vitamin D deficiency, unspecified: Secondary | ICD-10-CM | POA: Insufficient documentation

## 2014-12-21 HISTORY — DX: Hesitancy of micturition: R39.11

## 2014-12-21 NOTE — Assessment & Plan Note (Signed)
Gabapentin partially helpful can consider increase as needed

## 2014-12-21 NOTE — Assessment & Plan Note (Signed)
Deficiency noted with labwork, started on 50000 IU q week and recheck in 12 weeks

## 2014-12-21 NOTE — Assessment & Plan Note (Signed)
Tolerating statin, encouraged heart healthy diet, avoid trans fats, minimize simple carbs and saturated fats. Increase exercise as tolerated 

## 2014-12-21 NOTE — Progress Notes (Signed)
Tyler Sims  [id number] Jul 26, 1956 12/21/2014      Progress Note-Follow Up  Subjective  Chief Complaint  Chief Complaint  Patient presents with  . Follow-up    6 month    HPI  Patient is a 58 y.o. male in today for routine medical care. Patient is in today for follow-up with numerous concerns. He notes persistent peripheral neuropathy and burning in his legs. Is noting some urinary hesitancy but denies any dysuria, frequency or urgency. Has some persistent fatigue but no other new or acute illness or complaint. Continues to struggle with poor sleep. Denies CP/palp/SOB/HA/congestion/fevers/GI or GU c/o. Taking meds as prescribed  Past Medical History  Diagnosis Date  . Stroke 08/24/2010  . Systolic CHF   . Persistent atrial fibrillation 08/24/2010  . NICM (nonischemic cardiomyopathy)   . Bradycardia     nocturnal  . Ventricular tachycardia, non-sustained   . Mitral regurgitation   . Varicella   . Mumps   . Measles (roseola)   . Abnormal transaminases   . Acid reflux   . Hypertension   . Colon polyps 02/07/2012    Dr. Silvano Rusk  . HTN (hypertension) 06/02/2014    Past Surgical History  Procedure Laterality Date  . Cardiac catheterization    . Colonoscopy w/ biopsies  02/07/2012    Dr. Silvano Rusk  . Cardioversion  08/15/2012    Procedure: CARDIOVERSION;  Surgeon: Deboraha Sprang, MD;  Location: [facility] ENDOSCOPY;  Service: Cardiovascular;  Laterality: N/A;    Family History  Problem Relation Age of Onset  . Alzheimer's disease Mother   . Lung cancer Brother     died from  . Cancer Brother     lung, smoker  . Arrhythmia Brother   . Hyperlipidemia Brother   . Hypertension Sister     X31  . Heart disease Father     CAD/MI  . Hypertension Father   . Mental illness Father     h/o depression requiring hospitalization  . Hyperlipidemia Sister   . Hypertension Brother     X3  . Heart disease Brother     cardiomyopathy, a. fib  . Colon polyps Brother   .  Cancer Brother     lung, smoker    History   Social History  . Marital Status: Married    Spouse Name: Charlett Nose  . Number of Children: 2  . Years of Education: 16   Occupational History  . Nature conservation officer   .     Social History Main Topics  . Smoking status: Never Smoker   . Smokeless tobacco: Never Used  . Alcohol Use: No  . Drug Use: No  . Sexual Activity:    Partners: Female     Comment: lives with wife, no dietary restrictions, except avoid dairy   Other Topics Concern  . Not on file   Social History Narrative   Patient is married with 2 children.   Patient is right handed.   Patient has 16 years of education.   Patient drinks 1-2 cups daily.    Current Outpatient Prescriptions on File Prior to Visit  Medication Sig Dispense Refill  . atorvastatin (LIPITOR) 20 MG tablet TAKE 1 TABLET BY MOUTH ONCE DAILY AT 6PM 90 tablet 0  . COD LIVER OIL PO Take by mouth daily.    . Coenzyme Q10 (CO Q-10) 200 MG CAPS Take 200 mg by mouth.    . gabapentin (NEURONTIN) 300 MG capsule Take 1 capsule (300  mg total) by mouth 3 (three) times daily. Start one capsule twice daily x 1 week then increase as tolerated to three times daily 90 capsule 11  . losartan (COZAAR) 25 MG tablet TAKE 1 TABLET BY MOUTH EVERY DAY NEED APPT 30 tablet 7  . spironolactone (ALDACTONE) 25 MG tablet TAKE 1/2 TABLET BY MOUTH DAILY 30 tablet 0  . Vitamin D, Ergocalciferol, (DRISDOL) 50000 UNITS CAPS capsule Take 1 capsule (50,000 Units total) by mouth every 7 (seven) days. 4 capsule 3  . warfarin (COUMADIN) 5 MG tablet TAKE AS DIRECTED BY ANTICOAGULATION CLINIC 40 tablet 2   No current facility-administered medications on file prior to visit.    Allergies  Allergen Reactions  . Lisinopril Rash  . Simvastatin Rash    Review of Systems  Review of Systems  Constitutional: Negative for fever and malaise/fatigue.  HENT: Negative for congestion.   Eyes: Negative for discharge.  Respiratory: Negative  for shortness of breath.   Cardiovascular: Negative for chest pain, palpitations and leg swelling.  Gastrointestinal: Negative for nausea, abdominal pain and diarrhea.  Genitourinary: Negative for dysuria, urgency, frequency and hematuria.  Musculoskeletal: Negative for falls.  Skin: Negative for rash.  Neurological: Positive for tingling. Negative for loss of consciousness and headaches.  Endo/Heme/Allergies: Negative for polydipsia.  Psychiatric/Behavioral: Negative for depression and suicidal ideas. The patient has insomnia. The patient is not nervous/anxious.     Objective  BP 122/78 mmHg  Pulse 58  Temp(Src) 98.1 F (36.7 C) (Oral)  Resp 16  Wt 211 lb 8 oz (95.936 kg)  SpO2 97%  Physical Exam  Physical Exam  Lab Results  Component Value Date   TSH 1.16 12/09/2014   Lab Results  Component Value Date   WBC 4.3 12/09/2014   HGB 14.2 12/09/2014   HCT 40.5 12/09/2014   MCV 92.4 12/09/2014   PLT 190.0 12/09/2014   Lab Results  Component Value Date   CREATININE 0.94 12/09/2014   BUN 15 12/09/2014   NA 136 12/09/2014   K 4.4 12/09/2014   CL 102 12/09/2014   CO2 30 12/09/2014   Lab Results  Component Value Date   ALT 19 12/09/2014   AST 25 12/09/2014   ALKPHOS 65 12/09/2014   BILITOT 1.3* 12/09/2014   Lab Results  Component Value Date   CHOL 147 12/09/2014   Lab Results  Component Value Date   HDL 62.70 12/09/2014   Lab Results  Component Value Date   LDLCALC 73 12/09/2014   Lab Results  Component Value Date   TRIG 58.0 12/09/2014   Lab Results  Component Value Date   CHOLHDL 2 12/09/2014     Assessment & Plan  HTN (hypertension) Well controlled, no changes to meds. Encouraged heart healthy diet such as the DASH diet and exercise as tolerated.    Hyperlipidemia Tolerating statin, encouraged heart healthy diet, avoid trans fats, minimize simple carbs and saturated fats. Increase exercise as tolerated   Encounter for therapeutic drug  monitoring Tolerating Coumadin   Secondary cardiomyopathy Is following with cardiology, no recent flares   Cerebral artery occlusion with cerebral infarction Follows with neurology with Dr Leonie Man, no recent episodes but c/o persistent trouble with sleep since stroke. Encouraged good sleep hygiene and melatonin   Hereditary and idiopathic peripheral neuropathy Gabapentin partially helpful can consider increase as needed   Urinary hesitancy PSA normal. Report worsening symptoms and consider referral to urology if so   Vitamin D deficiency Deficiency noted with labwork, started on 50000  IU q week and recheck in 12 weeks

## 2014-12-21 NOTE — Assessment & Plan Note (Signed)
Well controlled, no changes to meds. Encouraged heart healthy diet such as the DASH diet and exercise as tolerated.  °

## 2014-12-21 NOTE — Assessment & Plan Note (Signed)
PSA normal. Report worsening symptoms and consider referral to urology if so

## 2014-12-21 NOTE — Assessment & Plan Note (Signed)
Tolerating Coumadin 

## 2014-12-21 NOTE — Assessment & Plan Note (Signed)
Follows with neurology with Dr Leonie Man, no recent episodes but c/o persistent trouble with sleep since stroke. Encouraged good sleep hygiene and melatonin

## 2014-12-21 NOTE — Assessment & Plan Note (Signed)
Is following with cardiology, no recent flares

## 2014-12-24 ENCOUNTER — Ambulatory Visit (INDEPENDENT_AMBULATORY_CARE_PROVIDER_SITE_OTHER): Payer: Managed Care, Other (non HMO) | Admitting: General Practice

## 2014-12-24 ENCOUNTER — Other Ambulatory Visit: Payer: Self-pay | Admitting: General Practice

## 2014-12-24 DIAGNOSIS — Z8673 Personal history of transient ischemic attack (TIA), and cerebral infarction without residual deficits: Secondary | ICD-10-CM | POA: Diagnosis not present

## 2014-12-24 DIAGNOSIS — Z5181 Encounter for therapeutic drug level monitoring: Secondary | ICD-10-CM

## 2014-12-24 DIAGNOSIS — Z7901 Long term (current) use of anticoagulants: Secondary | ICD-10-CM | POA: Diagnosis not present

## 2014-12-24 DIAGNOSIS — I639 Cerebral infarction, unspecified: Secondary | ICD-10-CM

## 2014-12-24 DIAGNOSIS — I4891 Unspecified atrial fibrillation: Secondary | ICD-10-CM | POA: Diagnosis not present

## 2014-12-24 LAB — POCT INR: INR: 2.7

## 2014-12-24 MED ORDER — WARFARIN SODIUM 5 MG PO TABS: ORAL_TABLET | ORAL | Status: DC

## 2014-12-24 NOTE — Progress Notes (Signed)
Agree with plan 

## 2014-12-24 NOTE — Progress Notes (Signed)
Pre visit review using our clinic review tool, if applicable. No additional management support is needed unless otherwise documented below in the visit note. 

## 2015-01-04 ENCOUNTER — Other Ambulatory Visit: Payer: Self-pay | Admitting: Family Medicine

## 2015-01-04 ENCOUNTER — Other Ambulatory Visit: Payer: Self-pay | Admitting: Neurology

## 2015-01-05 ENCOUNTER — Other Ambulatory Visit: Payer: Self-pay | Admitting: Internal Medicine

## 2015-01-14 ENCOUNTER — Ambulatory Visit (INDEPENDENT_AMBULATORY_CARE_PROVIDER_SITE_OTHER): Payer: Managed Care, Other (non HMO)

## 2015-01-14 DIAGNOSIS — I639 Cerebral infarction, unspecified: Secondary | ICD-10-CM

## 2015-01-14 DIAGNOSIS — I6340 Cerebral infarction due to embolism of unspecified cerebral artery: Secondary | ICD-10-CM | POA: Diagnosis not present

## 2015-02-01 ENCOUNTER — Other Ambulatory Visit: Payer: Self-pay | Admitting: Internal Medicine

## 2015-02-04 ENCOUNTER — Ambulatory Visit (INDEPENDENT_AMBULATORY_CARE_PROVIDER_SITE_OTHER): Payer: Managed Care, Other (non HMO) | Admitting: General Practice

## 2015-02-04 DIAGNOSIS — Z8673 Personal history of transient ischemic attack (TIA), and cerebral infarction without residual deficits: Secondary | ICD-10-CM | POA: Diagnosis not present

## 2015-02-04 DIAGNOSIS — I4891 Unspecified atrial fibrillation: Secondary | ICD-10-CM | POA: Diagnosis not present

## 2015-02-04 DIAGNOSIS — Z7901 Long term (current) use of anticoagulants: Secondary | ICD-10-CM

## 2015-02-04 DIAGNOSIS — I639 Cerebral infarction, unspecified: Secondary | ICD-10-CM

## 2015-02-04 DIAGNOSIS — Z5181 Encounter for therapeutic drug level monitoring: Secondary | ICD-10-CM

## 2015-02-04 LAB — POCT INR: INR: 2.3

## 2015-02-04 NOTE — Progress Notes (Signed)
Pre visit review using our clinic review tool, if applicable. No additional management support is needed unless otherwise documented below in the visit note. 

## 2015-02-04 NOTE — Progress Notes (Signed)
Agree with plan 

## 2015-03-10 ENCOUNTER — Ambulatory Visit: Payer: Managed Care, Other (non HMO) | Admitting: Neurology

## 2015-03-18 ENCOUNTER — Ambulatory Visit (INDEPENDENT_AMBULATORY_CARE_PROVIDER_SITE_OTHER): Payer: Managed Care, Other (non HMO) | Admitting: General Practice

## 2015-03-18 DIAGNOSIS — Z7901 Long term (current) use of anticoagulants: Secondary | ICD-10-CM | POA: Diagnosis not present

## 2015-03-18 DIAGNOSIS — Z8673 Personal history of transient ischemic attack (TIA), and cerebral infarction without residual deficits: Secondary | ICD-10-CM

## 2015-03-18 DIAGNOSIS — I639 Cerebral infarction, unspecified: Secondary | ICD-10-CM | POA: Diagnosis not present

## 2015-03-18 DIAGNOSIS — I4891 Unspecified atrial fibrillation: Secondary | ICD-10-CM | POA: Diagnosis not present

## 2015-03-18 DIAGNOSIS — Z5181 Encounter for therapeutic drug level monitoring: Secondary | ICD-10-CM

## 2015-03-18 LAB — POCT INR: INR: 2.2

## 2015-03-18 NOTE — Progress Notes (Signed)
Pre visit review using our clinic review tool, if applicable. No additional management support is needed unless otherwise documented below in the visit note. 

## 2015-03-23 NOTE — Progress Notes (Signed)
I have reviewed and agree with the plan. 

## 2015-03-25 ENCOUNTER — Ambulatory Visit (INDEPENDENT_AMBULATORY_CARE_PROVIDER_SITE_OTHER): Payer: Managed Care, Other (non HMO) | Admitting: Neurology

## 2015-03-25 ENCOUNTER — Encounter: Payer: Self-pay | Admitting: Neurology

## 2015-03-25 VITALS — BP 145/84 | HR 68 | Ht 76.0 in | Wt 215.6 lb

## 2015-03-25 DIAGNOSIS — G629 Polyneuropathy, unspecified: Secondary | ICD-10-CM | POA: Diagnosis not present

## 2015-03-25 NOTE — Progress Notes (Signed)
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 Patient is in quite well with near complete recovery and full functional independence.   UPDATE 02/25/14 (LL): Patient returns for stroke follow up, last visit was with me on 08/22/13. Continues warfarin tolerating well, has had mild fluctuations of his INR. Carotid Doppler on 03/12/13 was negative. He states that BP is well controlled, in office today is 134/80. Patient denies medication side effects, with no signs of bleeding.He does not want to try any medication for it at this time. Recent lipid panel with total cholesterol 133, LDL 58, HDL 68, on Crestor. Patient is not diabetic. No new neurological symptoms, but complains of burning/sore sensation in his lower legs and feet, over last 1 year. Burning pain is intermittent, worst after standing on his feet at work all day. B12, TSH, and HgbA1c normal. Symptom was still present while on Pravachol.   UPDATE 04/22/14 (LL): Tried trial off statins for 6 weeks, with no change in symptoms, so not likely related to statin use. Dr. Caryl Comes checked Sed rate and CRP, both normal.  Total cholesterol off statin rose to 226, LDL 126. Burning in lower legs worse when standing for long hours, does not prevent him from sleeping at night.  Update 12/01/2014 : He returns for follow-up after last visit 7 months ago. He continues to do well from stroke standpoint without any new neurovascular symptoms since his stroke in December 2011. He has made a full recovery and is back to working full-time  and has no disability from his stroke. He remains on warfarin which is tolerating well without significant bleeding or bruising. His INR has been quite stable recently. He is also tolerating atorvastatin without side effects and plans on having lipid profile checked next week with his primary physician. He continues to complain of tingling numbness and burning in his feet particularly on days when he's been on his feet for long periods of more noticeable when he is sitting down or lying down quietly. He was recommended a trial of gabapentin or Lyrica at last visit but chose not to take the medicines. He says that his sister has a severe peripheral neuropathy and he is now worried and would like evaluation for this. He denies significant problems with balance or falls or weakness in his muscles in the legs. Update 03/25/2015 :  He returns for follow-up after last visit 4 months ago. He reports improvement in his lower extremity paresthesias with gabapentin which he takes 300 mg 3 times daily. He does have good and bad days. When he is active and on his feet he doesn't notice the paresthesias as much but when his resting or on weekends he notices burning in his feet. He is tolerating gabapentinwithout any dizziness, weight gain or sleepiness. He had lab work done on 12/01/14 for neuropathy panel labs all of which were normal except low vitamin D levels of 23.8. He has been seen by his primary physician who has started him on vitamin D and plans to repeat blood work soon. He had follow-up carotid ultrasound done on 01/14/15 which was normal.  He is doing well from stroke standpoint without significant recurrent stroke or TIA symptoms. His blood pressure usually is in the 130s though it is slightly elevated at 148/34 in office today. He did have lipid profile checked a few months ago by primary physician and it was fine. His remains on warfarin which is tolerating well without bleeding and bruising and his INR has been  quite stable and needs to be checked only every 6 weeks. REVIEW OF SYSTEMS: Full 14 system review of systems performed and notable only for: fatigue,  , palpitations, legs burning, tingling and pain.Marland Kitchen  ALLERGIES: Allergies  Allergen Reactions  . Lisinopril Rash  . Simvastatin Rash    HOME MEDICATIONS: Outpatient Prescriptions Prior to Visit  Medication Sig Dispense Refill  . atorvastatin (LIPITOR) 20 MG tablet TAKE 1 TABLET BY MOUTH ONCE DAILY AT 6PM 90 tablet 1  . COD LIVER OIL PO Take by mouth daily.    . Coenzyme Q10 (CO Q-10) 200 MG CAPS Take 200 mg by mouth.    . gabapentin (NEURONTIN) 300 MG capsule Take 1 capsule (300 mg total) by mouth 3 (three) times daily. Start one capsule twice daily x 1 week then increase as tolerated to three times daily 90 capsule 11  . losartan (COZAAR) 25 MG tablet TAKE 1 TABLET BY MOUTH EVERY DAY NEED APPT 30 tablet 7  . metoprolol succinate (TOPROL XL) 25 MG 24 hr tablet Take 1 tablet (25 mg total) by mouth daily. 90 tablet 2  . spironolactone (ALDACTONE) 25 MG tablet TAKE 1/2 TABLET BY MOUTH DAILY 30 tablet 2  . warfarin (COUMADIN) 5 MG tablet Take as directed by anticoagulation clinic 40 tablet 3  . warfarin (COUMADIN) 5 MG tablet TAKE AS DIRECTED BY ANTICOAGULATION CLINIC 40 tablet 2  . Vitamin D, Ergocalciferol, (DRISDOL) 50000 UNITS CAPS capsule Take 1 capsule (50,000 Units total) by mouth every 7 (seven) days. 4 capsule 3   No facility-administered medications prior to visit.    PHYSICAL EXAM Filed Vitals:   03/25/15 0839  BP: 145/84  Pulse: 68  Height: 6\' 4"  (1.93 m)  Weight: 215 lb 9.6 oz (97.796 kg)   Body mass index is 26.25 kg/(m^2).  Generalized: Well developed middle aged african [place] male not in distress., in no acute distress  Head: normocephalic and atraumatic.   Neck: Supple, no carotid bruits  Cardiac: Regular rate rhythm, no murmur  Musculoskeletal: No deformity  Neurological examination  Mentation: Alert oriented  to time, place, history taking. Follows all commands speech and language fluent  Cranial nerve II-XII: Fundoscopic exam not done margins. Pupils were equal round reactive to light extraocular movements were full, visual field were full on confrontational test. Facial sensation and strength were normal. hearing was intact to finger rubbing bilaterally. Uvula tongue midline. head turning and shoulder shrug and were normal and symmetric.Tongue protrusion into cheek strength was normal.  Motor: The motor testing reveals 5 over 5 strength of all 4 extremities. Good symmetric motor tone is noted throughout.  Sensory: Sensory testing is intact to soft touch on all 4 extremities. No evidence of extinction is noted. Diminished touch and pinprick sensation in both feet from ankle down. Diminished vibration sensation over toes bilaterally. Normal position sense. Romberg sign negative.  Coordination: Cerebellar testing reveals good finger-nose-finger and heel-to-shin bilaterally.  Gait and station: Gait is normal. Tandem gait is normal. Romberg is negative.  Reflexes: Deep tendon reflexes are symmetric and normal bilaterally except ankle jerks are depressed.Marland Kitchen  NIH SS  0. Modified Rankin scale 1. DIAGNOSTIC DATA (LABS, IMAGING, TESTING) - I reviewed patient records, labs, notes, testing and imaging myself where available.  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 Vascular  risk factors include atrial fibrillation, hypertension, carotid artery disease, and hyperlipidemia.   New problem of lower leg burning and soreness over last 1 1/2 yeas, likely peripheral neuropathy. Tried trial off statins for 6 weeks, with no change in symptoms, so not likely related to statin use.   PLAN :  I had a long d/w patient about his remote stroke, risk for recurrent stroke/TIAs, personally independently reviewed imaging studies and lab evaluation results and answered questions.Continue warfarin  for secondary stroke prevention and maintain strict control of hypertension with blood pressure goal below 130/90, diabetes with hemoglobin A1c goal below 6.5% and lipids with LDL cholesterol goal below 100 mg/dL. Marland Kitchen Continue gabapentin for paresthesias, sensory neuropathy. He may take extra doses on bad days. Follow-up with primary physician for low vitamin D levels. Greater than 50% of this 25 minute visit was spent in counseling and coordination of care. Followup in the future with me in 6 months or call earlier if necessary  Antony Contras, MD  03/25/2015, 9:36 AM [facility] [address] [place], [place] [address] [phone]  Note: This document was prepared with digital dictation and possible smart phrase technology. Any transcriptional errors that result from this process are unintentional.

## 2015-03-25 NOTE — Patient Instructions (Addendum)
I had a long d/w patient about his remote stroke, risk for recurrent stroke/TIAs, personally independently reviewed imaging studies and lab evaluation results and answered questions.Continue warfarin  for secondary stroke prevention and maintain strict control of hypertension with blood pressure goal below 130/90, diabetes with hemoglobin A1c goal below 6.5% and lipids with LDL cholesterol goal below 100 mg/dL. Tyler Sims Continue gabapentin for paresthesias, sensory neuropathy. He may take extra doses on bad days. Follow-up with primary physician for low vitamin D levels. Greater than 50% of this 25 minute visit was spent in counseling and coordination of care. Followup in the future with me in 6 months or call earlier if necessary

## 2015-03-26 ENCOUNTER — Encounter: Payer: Self-pay | Admitting: Internal Medicine

## 2015-03-29 ENCOUNTER — Other Ambulatory Visit: Payer: Self-pay | Admitting: Internal Medicine

## 2015-04-16 ENCOUNTER — Encounter: Payer: Self-pay | Admitting: Internal Medicine

## 2015-04-16 ENCOUNTER — Ambulatory Visit (INDEPENDENT_AMBULATORY_CARE_PROVIDER_SITE_OTHER): Payer: Managed Care, Other (non HMO) | Admitting: Internal Medicine

## 2015-04-16 VITALS — BP 146/80 | HR 61 | Ht 75.0 in | Wt 217.0 lb

## 2015-04-16 DIAGNOSIS — I428 Other cardiomyopathies: Secondary | ICD-10-CM

## 2015-04-16 DIAGNOSIS — I480 Paroxysmal atrial fibrillation: Secondary | ICD-10-CM | POA: Diagnosis not present

## 2015-04-16 DIAGNOSIS — I429 Cardiomyopathy, unspecified: Secondary | ICD-10-CM | POA: Diagnosis not present

## 2015-04-16 NOTE — Patient Instructions (Signed)
Medication Instructions:  Your physician recommends that you continue on your current medications as directed. Please refer to the Current Medication list given to you today.  Labwork: None ordered  Testing/Procedures: None ordered  Follow-Up: Your physician wants you to follow-up in: 1 year with Amber Seiler, NP .  You will receive a reminder letter in the mail two months in advance. If you don't receive a letter, please call our office to schedule the follow-up appointment.  Any Other Special Instructions Will Be Listed Below (If Applicable). Thank you for choosing [facility]!!         

## 2015-04-16 NOTE — Progress Notes (Signed)
Patient Care Team: Mosie Lukes, MD as PCP - General (Family Medicine)   HPI  Tyler Sims is a 59 y.o. male Seen in followup for nonischemic cardiomyopathy with which he presented with a stroke and atrial fibrillation.  He is on Coumadin anticoagulation   It was initially hoped that this was secondary to the rate. However, despite restoration of sinus rhythm, he has had persistent left ventricular dysfunction.  Ejection fraction remain   low   Echo  January 2014 EF improved to 40-45% range  he remains on Aldactone. Potassium was 4.43/16   He also has significant fatigue this seems to be aggravated by long work hours.  . He notes that his heart rate runs in the   50s. He says his wife does not complaining much of his snoring.   This continues to be an issue. He has occasional   exercise associated chest heaviness. He has known normal catheterization 2012 which I reviewed today.   He denies edema palpitations nocturnal dyspnea or orthopnea    Past Medical History  Diagnosis Date  . Stroke 08/24/2010  . Systolic CHF   . Persistent atrial fibrillation 08/24/2010  . NICM (nonischemic cardiomyopathy)   . Bradycardia     nocturnal  . Ventricular tachycardia, non-sustained   . Mitral regurgitation   . Varicella   . Mumps   . Measles (roseola)   . Abnormal transaminases   . Acid reflux   . Hypertension   . Colon polyps 02/07/2012    Dr. Silvano Rusk  . HTN (hypertension) 06/02/2014  . Urinary hesitancy 12/21/2014    Past Surgical History  Procedure Laterality Date  . Cardiac catheterization    . Colonoscopy w/ biopsies  02/07/2012    Dr. Silvano Rusk  . Cardioversion  08/15/2012    Procedure: CARDIOVERSION;  Surgeon: Deboraha Sprang, MD;  Location: [facility] ENDOSCOPY;  Service: Cardiovascular;  Laterality: N/A;    Current Outpatient Prescriptions  Medication Sig Dispense Refill  . atorvastatin (LIPITOR) 20 MG tablet TAKE 1 TABLET BY MOUTH ONCE DAILY AT 6PM 90 tablet 1  . COD LIVER OIL  PO Take 1 capsule by mouth daily.     . Coenzyme Q10 (CO Q-10) 200 MG CAPS Take 200 mg by mouth daily.     Marland Kitchen gabapentin (NEURONTIN) 300 MG capsule Take 300 mg by mouth 3 (three) times daily.    Marland Kitchen losartan (COZAAR) 25 MG tablet Take 25 mg by mouth daily.    . metoprolol succinate (TOPROL XL) 25 MG 24 hr tablet Take 1 tablet (25 mg total) by mouth daily. 90 tablet 2  . spironolactone (ALDACTONE) 25 MG tablet TAKE 1/2 TABLET BY MOUTH DAILY 30 tablet 2  . warfarin (COUMADIN) 5 MG tablet TAKE AS DIRECTED BY ANTICOAGULATION CLINIC 40 tablet 2   No current facility-administered medications for this visit.    Allergies  Allergen Reactions  . Lisinopril Rash  . Simvastatin Rash    Review of Systems negative except from HPI and PMH  Physical Exam BP 146/80 mmHg  Pulse 61  Ht 6\' 3"  (1.905 m)  Wt 217 lb (98.431 kg)  BMI 27.12 kg/m2 Well developed and well nourished in no acute distress HENT normal E scleral and icterus clear Neck Supple JVP flat; carotids brisk and full Clear to ausculation  Regular rate and rhythm, no murmurs gallops or rub Soft with active bowel sounds No clubbing cyanosis none Edema Alert and oriented, grossly normal motor and sensory function Skin Warm and  Dry  Electrocardiogram demonstrates sinus rhythm at 53 Interval 17/09/45 Nonspecific T-wave flattening   Assessment and  Plan Paroxysmal atrial fibrillation  Sinus bradycardia  Nonischemic cardiomyopathy  Fatigue  His  blood pressure remains mildly elevated. This has been persistent. At the next visit in the fall, it remains elevated I would increase his Cozaar from 25--50.  No known intercurrent atrial fibrillation  Continue warfarin.

## 2015-04-29 ENCOUNTER — Ambulatory Visit (INDEPENDENT_AMBULATORY_CARE_PROVIDER_SITE_OTHER): Payer: Managed Care, Other (non HMO) | Admitting: General Practice

## 2015-04-29 DIAGNOSIS — Z5181 Encounter for therapeutic drug level monitoring: Secondary | ICD-10-CM

## 2015-04-29 DIAGNOSIS — I639 Cerebral infarction, unspecified: Secondary | ICD-10-CM | POA: Diagnosis not present

## 2015-04-29 DIAGNOSIS — I4891 Unspecified atrial fibrillation: Secondary | ICD-10-CM | POA: Diagnosis not present

## 2015-04-29 DIAGNOSIS — Z8673 Personal history of transient ischemic attack (TIA), and cerebral infarction without residual deficits: Secondary | ICD-10-CM

## 2015-04-29 DIAGNOSIS — Z7901 Long term (current) use of anticoagulants: Secondary | ICD-10-CM | POA: Diagnosis not present

## 2015-04-29 LAB — POCT INR: INR: 2.2

## 2015-04-29 NOTE — Progress Notes (Signed)
I have reviewed and agree with the plan. 

## 2015-04-29 NOTE — Progress Notes (Signed)
Pre visit review using our clinic review tool, if applicable. No additional management support is needed unless otherwise documented below in the visit note. 

## 2015-05-29 ENCOUNTER — Encounter: Payer: Self-pay | Admitting: Internal Medicine

## 2015-05-29 ENCOUNTER — Ambulatory Visit (INDEPENDENT_AMBULATORY_CARE_PROVIDER_SITE_OTHER): Payer: Managed Care, Other (non HMO) | Admitting: Internal Medicine

## 2015-05-29 ENCOUNTER — Telehealth: Payer: Self-pay | Admitting: General Practice

## 2015-05-29 ENCOUNTER — Telehealth: Payer: Self-pay

## 2015-05-29 VITALS — BP 140/80 | HR 60 | Ht 74.25 in | Wt 218.4 lb

## 2015-05-29 DIAGNOSIS — Z7901 Long term (current) use of anticoagulants: Secondary | ICD-10-CM

## 2015-05-29 DIAGNOSIS — I4891 Unspecified atrial fibrillation: Secondary | ICD-10-CM

## 2015-05-29 DIAGNOSIS — Z86010 Personal history of colonic polyps: Secondary | ICD-10-CM | POA: Diagnosis not present

## 2015-05-29 NOTE — Telephone Encounter (Signed)
[facility] [address] [place] [address]  Dear Rupert Stacks MD,    We have scheduled the above patient for an endoscopic procedure. Our records show that he is on anticoagulation therapy.   Please advise as to how long the patient may come off his therapy of warfarin prior to the colonoscopy procedure, which is scheduled for 06/22/15.  May need bridging as he has done in the past.  Please fax back/ or route the completed form to Shandreka Dante [place], [place] at [phone].   Sincerely,    Silvano Rusk, MD, [facility]

## 2015-05-29 NOTE — Assessment & Plan Note (Signed)
Repeat colonoscopy The risks and benefits as well as alternatives of endoscopic procedure(s) have been discussed and reviewed. All questions answered. The patient agrees to proceed. He understands extra risk of stroke and vascular event off warfarin - we will use Lovenox bridge again

## 2015-05-29 NOTE — Telephone Encounter (Signed)
-----   Message from Mosie Lukes, MD sent at 05/29/2015 11:25 AM EDT ----- I would probably bridge him, he has cardiac and neurologic risk factors.  Let me know if you need my help. Dr B ----- Message -----    From: Warden Fillers, RN    Sent: 05/29/2015  11:13 AM      To: Mosie Lukes, MD  Hi Dr. Charlett Blake,  Patient is having a colonoscopy on 10/3.  He will need to stop coumadin for 5 days.  Do you want him to have Lovenox bridge?  I will be happy to dose and call it in if you like.  Thanks, Villa Herb, RN

## 2015-05-29 NOTE — Patient Instructions (Addendum)
You have been scheduled for a colonoscopy. Please follow written instructions given to you at your visit today.  Please pick up your over the counter prep supplies at the pharmacy . If you use inhalers (even only as needed), please bring them with you on the day of your procedure.  You will be contaced by our office prior to your procedure for directions on holding your Coumadin/Warfarin.  If you do not hear from our office 1 week prior to your scheduled procedure, please call [phone] to discuss. You will need bridging.  I appreciate the opportunity to care for you. Silvano Rusk, MD, [facility]

## 2015-05-29 NOTE — Assessment & Plan Note (Signed)
Lovenox bridge will be used

## 2015-05-29 NOTE — Assessment & Plan Note (Signed)
Warfarin for Afib

## 2015-05-29 NOTE — Progress Notes (Signed)
   Subjective:    Patient ID: Tyler Sims, male    DOB: 1956-05-19, 59 y.o.   MRN: [id number] Cc: hx colon polyps, is on warfarin HPI  Very nice man w/ hx adenoma 10 mm and diminutive adenoma 3 yrs ago. No GI sx Hx stroke in Afib and is on warfarin. Had Lovenox bridge last time and does anytime holding warfarin. Medications, allergies, past medical history, past surgical history, family history and social history are reviewed and updated in the EMR.  Review of Systems As above    Objective:   Physical Exam @BP  140/80 mmHg  Pulse 60  Ht 6' 2.25" (1.886 m)  Wt 218 lb 6 oz (99.054 kg)  BMI 27.85 kg/m2@  General:  NAD Lungs:  clear Heart:: Sinus rhythm, S1S2 no rubs, murmurs or gallops Abdomen:  soft and nontender, BS+  Data Reviewed:  2013 colonoscopy and pathology Cardiology notes     Assessment & Plan:  History of colonic polyps Repeat colonoscopy The risks and benefits as well as alternatives of endoscopic procedure(s) have been discussed and reviewed. All questions answered. The patient agrees to proceed. He understands extra risk of stroke and vascular event off warfarin - we will use Lovenox bridge again  Long term current use of anticoagulant therapy Warfarin for Afib  Atrial fibrillation Lovenox bridge will be used

## 2015-06-02 NOTE — Telephone Encounter (Signed)
Pt has hx of prior stroke and anticoagulation should be discontinued only if necessary and bridging is ideal

## 2015-06-02 NOTE — Telephone Encounter (Signed)
I had planned on bridging - this need to be done through his anti-coag clinic

## 2015-06-02 NOTE — Telephone Encounter (Signed)
LM for patient that Meriam Sprague, RN will be taking care of his bridging.

## 2015-06-02 NOTE — Telephone Encounter (Signed)
Would you please help to set up bridging for [facility] , thank you.

## 2015-06-02 NOTE — Telephone Encounter (Signed)
Will you be setting up the bridging Sir?

## 2015-06-09 ENCOUNTER — Other Ambulatory Visit: Payer: Self-pay | Admitting: General Practice

## 2015-06-09 ENCOUNTER — Ambulatory Visit (INDEPENDENT_AMBULATORY_CARE_PROVIDER_SITE_OTHER): Payer: Managed Care, Other (non HMO) | Admitting: General Practice

## 2015-06-09 DIAGNOSIS — Z8673 Personal history of transient ischemic attack (TIA), and cerebral infarction without residual deficits: Secondary | ICD-10-CM

## 2015-06-09 DIAGNOSIS — I639 Cerebral infarction, unspecified: Secondary | ICD-10-CM | POA: Diagnosis not present

## 2015-06-09 DIAGNOSIS — Z7901 Long term (current) use of anticoagulants: Secondary | ICD-10-CM | POA: Diagnosis not present

## 2015-06-09 DIAGNOSIS — I4891 Unspecified atrial fibrillation: Secondary | ICD-10-CM | POA: Diagnosis not present

## 2015-06-09 DIAGNOSIS — Z5181 Encounter for therapeutic drug level monitoring: Secondary | ICD-10-CM

## 2015-06-09 LAB — POCT INR: INR: 2.9

## 2015-06-09 MED ORDER — ENOXAPARIN SODIUM 150 MG/ML ~~LOC~~ SOLN: 1.5000 mg/kg | SUBCUTANEOUS | Status: DC

## 2015-06-09 NOTE — Progress Notes (Signed)
Pre visit review using our clinic review tool, if applicable. No additional management support is needed unless otherwise documented below in the visit note. 

## 2015-06-09 NOTE — Progress Notes (Signed)
I have reviewed and agree with the plan. 

## 2015-06-09 NOTE — Patient Instructions (Addendum)
9/28 - Take last dose of coumadin until after procedure 9/29 - Nothing 9/30 - Lovenox x 1 in the morning 10/1 - Lovenox x 1 in the morning 10/2 - Lovenox x 1 in the morning 10/3 - NO LOVENOX 10/4 - Lovenox in the morning and 10 mg (2 tablets) of coumadin 10/5 - Lovenox in the morning and 10 mg (2 tablets) of coumadin 10/6 - Lovenox in the morning and 7.5 mg(1 1/2 tablets) of coumadin 10/7 - Lovenox in the morning and 7.5 mg(1 1/2 tablets) of coumadin 10/8 - Stop Lovenox and resume taking coumadin as prescribed.  Wednesday 10/12 - Check INR

## 2015-06-10 ENCOUNTER — Ambulatory Visit: Payer: Managed Care, Other (non HMO)

## 2015-06-15 ENCOUNTER — Encounter: Payer: Self-pay | Admitting: Family Medicine

## 2015-06-15 ENCOUNTER — Ambulatory Visit (INDEPENDENT_AMBULATORY_CARE_PROVIDER_SITE_OTHER): Payer: Managed Care, Other (non HMO) | Admitting: Family Medicine

## 2015-06-15 ENCOUNTER — Telehealth: Payer: Self-pay | Admitting: Family Medicine

## 2015-06-15 VITALS — BP 120/74 | HR 63 | Temp 98.0°F | Ht 75.0 in | Wt 216.5 lb

## 2015-06-15 DIAGNOSIS — Z23 Encounter for immunization: Secondary | ICD-10-CM

## 2015-06-15 DIAGNOSIS — M545 Low back pain, unspecified: Secondary | ICD-10-CM | POA: Insufficient documentation

## 2015-06-15 DIAGNOSIS — I4729 Other ventricular tachycardia: Secondary | ICD-10-CM

## 2015-06-15 DIAGNOSIS — E785 Hyperlipidemia, unspecified: Secondary | ICD-10-CM | POA: Diagnosis not present

## 2015-06-15 DIAGNOSIS — I10 Essential (primary) hypertension: Secondary | ICD-10-CM | POA: Diagnosis not present

## 2015-06-15 DIAGNOSIS — I6350 Cerebral infarction due to unspecified occlusion or stenosis of unspecified cerebral artery: Secondary | ICD-10-CM

## 2015-06-15 DIAGNOSIS — Z7901 Long term (current) use of anticoagulants: Secondary | ICD-10-CM

## 2015-06-15 DIAGNOSIS — Z0000 Encounter for general adult medical examination without abnormal findings: Secondary | ICD-10-CM | POA: Diagnosis not present

## 2015-06-15 DIAGNOSIS — I472 Ventricular tachycardia: Secondary | ICD-10-CM

## 2015-06-15 DIAGNOSIS — E559 Vitamin D deficiency, unspecified: Secondary | ICD-10-CM | POA: Diagnosis not present

## 2015-06-15 HISTORY — DX: Low back pain, unspecified: M54.50

## 2015-06-15 LAB — COMPREHENSIVE METABOLIC PANEL
ALBUMIN: 4.2 g/dL (ref 3.5–5.2)
ALK PHOS: 70 U/L (ref 39–117)
ALT: 20 U/L (ref 0–53)
AST: 25 U/L (ref 0–37)
BUN: 16 mg/dL (ref 6–23)
CALCIUM: 9.8 mg/dL (ref 8.4–10.5)
CHLORIDE: 104 meq/L (ref 96–112)
CO2: 28 mEq/L (ref 19–32)
Creatinine, Ser: 0.89 mg/dL (ref 0.40–1.50)
GFR: 112.97 mL/min (ref >60.00)
Glucose, Bld: 103 mg/dL — ABNORMAL HIGH (ref 70–99)
POTASSIUM: 4.1 meq/L (ref 3.5–5.1)
Sodium: 140 mEq/L (ref 135–145)
Total Bilirubin: 1.5 mg/dL — ABNORMAL HIGH (ref 0.2–1.2)
Total Protein: 7.9 g/dL (ref 6.0–8.3)

## 2015-06-15 LAB — CBC
HEMATOCRIT: 41.3 % (ref 39.0–52.0)
HEMOGLOBIN: 14 g/dL (ref 13.0–17.0)
MCHC: 34 g/dL (ref 30.0–36.0)
MCV: 93 fl (ref 78.0–100.0)
PLATELETS: 176 10*3/uL (ref 150.0–400.0)
RBC: 4.44 Mil/uL (ref 4.22–5.81)
RDW: 13.8 % (ref 11.5–15.5)
WBC: 4.5 10*3/uL (ref 4.0–10.5)

## 2015-06-15 LAB — VITAMIN D 25 HYDROXY (VIT D DEFICIENCY, FRACTURES): VITD: 34.08 ng/mL (ref 30.00–100.00)

## 2015-06-15 LAB — LIPID PANEL
CHOLESTEROL: 143 mg/dL (ref 0–200)
HDL: 60.1 mg/dL (ref >39.00)
LDL CALC: 70 mg/dL (ref 0–99)
NonHDL: 82.64
TRIGLYCERIDES: 64 mg/dL (ref 0.0–149.0)
Total CHOL/HDL Ratio: 2
VLDL: 12.8 mg/dL (ref 0.0–40.0)

## 2015-06-15 LAB — TSH: TSH: 1.37 u[IU]/mL (ref 0.35–4.50)

## 2015-06-15 MED ORDER — METHOCARBAMOL 500 MG PO TABS: 500.0000 mg | ORAL_TABLET | Freq: Two times a day (BID) | ORAL | Status: DC | PRN

## 2015-06-15 MED ORDER — ATORVASTATIN CALCIUM 20 MG PO TABS: ORAL_TABLET | ORAL | Status: DC

## 2015-06-15 NOTE — Assessment & Plan Note (Signed)
Tolerating statin, encouraged heart healthy diet, avoid trans fats, minimize simple carbs and saturated fats. Increase exercise as tolerated 

## 2015-06-15 NOTE — Assessment & Plan Note (Signed)
Follows with coumadin clinic at [facility], will be bridged for colonoscopy next week

## 2015-06-15 NOTE — Assessment & Plan Note (Signed)
Follows with neurology, no new events.

## 2015-06-15 NOTE — Assessment & Plan Note (Signed)
Gets some relief from Gabapentin 300 mg tid, is managed with neurology

## 2015-06-15 NOTE — Telephone Encounter (Signed)
Relation to pt: self  Call back number:[phone]  Reason for call:  Patient would like physical lab results please mail to home

## 2015-06-15 NOTE — Assessment & Plan Note (Signed)
RRR today 

## 2015-06-15 NOTE — Assessment & Plan Note (Signed)
Advised against oral NSAIDs due to coumadin. May use pain patches prn, works a very physical job. Given Robaxin prn for pain to try qhs, if no sedation may use during the day as well.

## 2015-06-15 NOTE — Telephone Encounter (Signed)
Mailed a copy of results to the patient.

## 2015-06-15 NOTE — Patient Instructions (Addendum)
Salon Pas and Aspercreme patches found over the counter, to be used for lower back pain. Take daily over the counter Vitamin D 2,000-5,000 IU.    Preventive Care for Adults A healthy lifestyle and preventive care can promote health and wellness. Preventive health guidelines for men include the following key practices:  A routine yearly physical is a good way to check with your health care provider about your health and preventative screening. It is a chance to share any concerns and updates on your health and to receive a thorough exam.  Visit your dentist for a routine exam and preventative care every 6 months. Brush your teeth twice a day and floss once a day. Good oral hygiene prevents tooth decay and gum disease.  The frequency of eye exams is based on your age, health, family medical history, use of contact lenses, and other factors. Follow your health care provider's recommendations for frequency of eye exams.  Eat a healthy diet. Foods such as vegetables, fruits, whole grains, low-fat dairy products, and lean protein foods contain the nutrients you need without too many calories. Decrease your intake of foods high in solid fats, added sugars, and salt. Eat the right amount of calories for you.Get information about a proper diet from your health care provider, if necessary.  Regular physical exercise is one of the most important things you can do for your health. Most adults should get at least 150 minutes of moderate-intensity exercise (any activity that increases your heart rate and causes you to sweat) each week. In addition, most adults need muscle-strengthening exercises on 2 or more days a week.  Maintain a healthy weight. The body mass index (BMI) is a screening tool to identify possible weight problems. It provides an estimate of body fat based on height and weight. Your health care provider can find your BMI and can help you achieve or maintain a healthy weight.For adults 20 years  and older:  A BMI below 18.5 is considered underweight.  A BMI of 18.5 to 24.9 is normal.  A BMI of 25 to 29.9 is considered overweight.  A BMI of 30 and above is considered obese.  Maintain normal blood lipids and cholesterol levels by exercising and minimizing your intake of saturated fat. Eat a balanced diet with plenty of fruit and vegetables. Blood tests for lipids and cholesterol should begin at age 68 and be repeated every 5 years. If your lipid or cholesterol levels are high, you are over 50, or you are at high risk for heart disease, you may need your cholesterol levels checked more frequently.Ongoing high lipid and cholesterol levels should be treated with medicines if diet and exercise are not working.  If you smoke, find out from your health care provider how to quit. If you do not use tobacco, do not start.  Lung cancer screening is recommended for adults aged 16-80 years who are at high risk for developing lung cancer because of a history of smoking. A yearly low-dose CT scan of the lungs is recommended for people who have at least a 30-pack-year history of smoking and are a current smoker or have quit within the past 15 years. A pack year of smoking is smoking an average of 1 pack of cigarettes a day for 1 year (for example: 1 pack a day for 30 years or 2 packs a day for 15 years). Yearly screening should continue until the smoker has stopped smoking for at least 15 years. Yearly screening should be stopped  for people who develop a health problem that would prevent them from having lung cancer treatment.  If you choose to drink alcohol, do not have more than 2 drinks per day. One drink is considered to be 12 ounces (355 mL) of beer, 5 ounces (148 mL) of wine, or 1.5 ounces (44 mL) of liquor.  Avoid use of street drugs. Do not share needles with anyone. Ask for help if you need support or instructions about stopping the use of drugs.  High blood pressure causes heart disease and  increases the risk of stroke. Your blood pressure should be checked at least every 1-2 years. Ongoing high blood pressure should be treated with medicines, if weight loss and exercise are not effective.  If you are 77-51 years old, ask your health care provider if you should take aspirin to prevent heart disease.  Diabetes screening involves taking a blood sample to check your fasting blood sugar level. This should be done once every 3 years, after age 57, if you are within normal weight and without risk factors for diabetes. Testing should be considered at a younger age or be carried out more frequently if you are overweight and have at least 1 risk factor for diabetes.  Colorectal cancer can be detected and often prevented. Most routine colorectal cancer screening begins at the age of 59 and continues through age 13. However, your health care provider may recommend screening at an earlier age if you have risk factors for colon cancer. On a yearly basis, your health care provider may provide home test kits to check for hidden blood in the stool. Use of a small camera at the end of a tube to directly examine the colon (sigmoidoscopy or colonoscopy) can detect the earliest forms of colorectal cancer. Talk to your health care provider about this at age 46, when routine screening begins. Direct exam of the colon should be repeated every 5-10 years through age 74, unless early forms of precancerous polyps or small growths are found.  People who are at an increased risk for hepatitis B should be screened for this virus. You are considered at high risk for hepatitis B if:  You were born in a country where hepatitis B occurs often. Talk with your health care provider about which countries are considered high risk.  Your parents were born in a high-risk country and you have not received a shot to protect against hepatitis B (hepatitis B vaccine).  You have HIV or AIDS.  You use needles to inject street  drugs.  You live with, or have sex with, someone who has hepatitis B.  You are a man who has sex with other men (MSM).  You get hemodialysis treatment.  You take certain medicines for conditions such as cancer, organ transplantation, and autoimmune conditions.  Hepatitis C blood testing is recommended for all people born from [birth year] through 1965 and any individual with known risks for hepatitis C.  Practice safe sex. Use condoms and avoid high-risk sexual practices to reduce the spread of sexually transmitted infections (STIs). STIs include gonorrhea, chlamydia, syphilis, trichomonas, herpes, HPV, and human immunodeficiency virus (HIV). Herpes, HIV, and HPV are viral illnesses that have no cure. They can result in disability, cancer, and death.  If you are at risk of being infected with HIV, it is recommended that you take a prescription medicine daily to prevent HIV infection. This is called preexposure prophylaxis (PrEP). You are considered at risk if:  You are a man who  has sex with other men (MSM) and have other risk factors.  You are a heterosexual man, are sexually active, and are at increased risk for HIV infection.  You take drugs by injection.  You are sexually active with a partner who has HIV.  Talk with your health care provider about whether you are at high risk of being infected with HIV. If you choose to begin PrEP, you should first be tested for HIV. You should then be tested every 3 months for as long as you are taking PrEP.  A one-time screening for abdominal aortic aneurysm (AAA) and surgical repair of large AAAs by ultrasound are recommended for men ages 26 to 34 years who are current or former smokers.  Healthy men should no longer receive prostate-specific antigen (PSA) blood tests as part of routine cancer screening. Talk with your health care provider about prostate cancer screening.  Testicular cancer screening is not recommended for adult males who have no  symptoms. Screening includes self-exam, a health care provider exam, and other screening tests. Consult with your health care provider about any symptoms you have or any concerns you have about testicular cancer.  Use sunscreen. Apply sunscreen liberally and repeatedly throughout the day. You should seek shade when your shadow is shorter than you. Protect yourself by wearing long sleeves, pants, a wide-brimmed hat, and sunglasses year round, whenever you are outdoors.  Once a month, do a whole-body skin exam, using a mirror to look at the skin on your back. Tell your health care provider about new moles, moles that have irregular borders, moles that are larger than a pencil eraser, or moles that have changed in shape or color.  Stay current with required vaccines (immunizations).  Influenza vaccine. All adults should be immunized every year.  Tetanus, diphtheria, and acellular pertussis (Td, Tdap) vaccine. An adult who has not previously received Tdap or who does not know his vaccine status should receive 1 dose of Tdap. This initial dose should be followed by tetanus and diphtheria toxoids (Td) booster doses every 10 years. Adults with an unknown or incomplete history of completing a 3-dose immunization series with Td-containing vaccines should begin or complete a primary immunization series including a Tdap dose. Adults should receive a Td booster every 10 years.  Varicella vaccine. An adult without evidence of immunity to varicella should receive 2 doses or a second dose if he has previously received 1 dose.  Human papillomavirus (HPV) vaccine. Males aged 16-21 years who have not received the vaccine previously should receive the 3-dose series. Males aged 22-26 years may be immunized. Immunization is recommended through the age of 54 years for any male who has sex with males and did not get any or all doses earlier. Immunization is recommended for any person with an immunocompromised condition  through the age of 31 years if he did not get any or all doses earlier. During the 3-dose series, the second dose should be obtained 4-8 weeks after the first dose. The third dose should be obtained 24 weeks after the first dose and 16 weeks after the second dose.  Zoster vaccine. One dose is recommended for adults aged 25 years or older unless certain conditions are present.  Measles, mumps, and rubella (MMR) vaccine. Adults born before [birth year] generally are considered immune to measles and mumps. Adults born in [birth year] or later should have 1 or more doses of MMR vaccine unless there is a contraindication to the vaccine or there is laboratory evidence of  immunity to each of the three diseases. A routine second dose of MMR vaccine should be obtained at least 28 days after the first dose for students attending postsecondary schools, health care workers, or international travelers. People who received inactivated measles vaccine or an unknown type of measles vaccine during 1963-1967 should receive 2 doses of MMR vaccine. People who received inactivated mumps vaccine or an unknown type of mumps vaccine before 1979 and are at high risk for mumps infection should consider immunization with 2 doses of MMR vaccine. Unvaccinated health care workers born before [birth year] who lack laboratory evidence of measles, mumps, or rubella immunity or laboratory confirmation of disease should consider measles and mumps immunization with 2 doses of MMR vaccine or rubella immunization with 1 dose of MMR vaccine.  Pneumococcal 13-valent conjugate (PCV13) vaccine. When indicated, a person who is uncertain of his immunization history and has no record of immunization should receive the PCV13 vaccine. An adult aged 22 years or older who has certain medical conditions and has not been previously immunized should receive 1 dose of PCV13 vaccine. This PCV13 should be followed with a dose of pneumococcal polysaccharide (PPSV23) vaccine. The PPSV23  vaccine dose should be obtained at least 8 weeks after the dose of PCV13 vaccine. An adult aged 35 years or older who has certain medical conditions and previously received 1 or more doses of PPSV23 vaccine should receive 1 dose of PCV13. The PCV13 vaccine dose should be obtained 1 or more years after the last PPSV23 vaccine dose.  Pneumococcal polysaccharide (PPSV23) vaccine. When PCV13 is also indicated, PCV13 should be obtained first. All adults aged 51 years and older should be immunized. An adult younger than age 28 years who has certain medical conditions should be immunized. Any person who resides in a nursing home or long-term care facility should be immunized. An adult smoker should be immunized. People with an immunocompromised condition and certain other conditions should receive both PCV13 and PPSV23 vaccines. People with human immunodeficiency virus (HIV) infection should be immunized as soon as possible after diagnosis. Immunization during chemotherapy or radiation therapy should be avoided. Routine use of PPSV23 vaccine is not recommended for American Indians, [place] Natives, or people younger than 65 years unless there are medical conditions that require PPSV23 vaccine. When indicated, people who have unknown immunization and have no record of immunization should receive PPSV23 vaccine. One-time revaccination 5 years after the first dose of PPSV23 is recommended for people aged 19-64 years who have chronic kidney failure, nephrotic syndrome, asplenia, or immunocompromised conditions. People who received 1-2 doses of PPSV23 before age 63 years should receive another dose of PPSV23 vaccine at age 48 years or later if at least 5 years have passed since the previous dose. Doses of PPSV23 are not needed for people immunized with PPSV23 at or after age 69 years.  Meningococcal vaccine. Adults with asplenia or persistent complement component deficiencies should receive 2 doses of quadrivalent  meningococcal conjugate (MenACWY-D) vaccine. The doses should be obtained at least 2 months apart. Microbiologists working with certain meningococcal bacteria, [employer], people at risk during an outbreak, and people who travel to or live in countries with a high rate of meningitis should be immunized. A first-year college student up through age 48 years who is living in a residence hall should receive a dose if he did not receive a dose on or after his 16th birthday. Adults who have certain high-risk conditions should receive one or more doses of vaccine.  Hepatitis A vaccine. Adults who wish to be protected from this disease, have certain high-risk conditions, work with hepatitis A-infected animals, work in hepatitis A research labs, or travel to or work in countries with a high rate of hepatitis A should be immunized. Adults who were previously unvaccinated and who anticipate close contact with an international adoptee during the first 60 days after arrival in the [place] from a country with a high rate of hepatitis A should be immunized.  Hepatitis B vaccine. Adults should be immunized if they wish to be protected from this disease, have certain high-risk conditions, may be exposed to blood or other infectious body fluids, are household contacts or sex partners of hepatitis B positive people, are clients or workers in certain care facilities, or travel to or work in countries with a high rate of hepatitis B.  Haemophilus influenzae type b (Hib) vaccine. A previously unvaccinated person with asplenia or sickle cell disease or having a scheduled splenectomy should receive 1 dose of Hib vaccine. Regardless of previous immunization, a recipient of a hematopoietic stem cell transplant should receive a 3-dose series 6-12 months after his successful transplant. Hib vaccine is not recommended for adults with HIV infection. Preventive Service / Frequency Ages 79 to 76  Blood pressure check.** /  Every 1 to 2 years.  Lipid and cholesterol check.** / Every 5 years beginning at age 6.  Hepatitis C blood test.** / For any individual with known risks for hepatitis C.  Skin self-exam. / Monthly.  Influenza vaccine. / Every year.  Tetanus, diphtheria, and acellular pertussis (Tdap, Td) vaccine.** / Consult your health care provider. 1 dose of Td every 10 years.  Varicella vaccine.** / Consult your health care provider.  HPV vaccine. / 3 doses over 6 months, if 17 or younger.  Measles, mumps, rubella (MMR) vaccine.** / You need at least 1 dose of MMR if you were born in 1957 or later. You may also need a second dose.  Pneumococcal 13-valent conjugate (PCV13) vaccine.** / Consult your health care provider.  Pneumococcal polysaccharide (PPSV23) vaccine.** / 1 to 2 doses if you smoke cigarettes or if you have certain conditions.  Meningococcal vaccine.** / 1 dose if you are age 33 to 69 years and a Market researcher living in a residence hall, or have one of several medical conditions. You may also need additional booster doses.  Hepatitis A vaccine.** / Consult your health care provider.  Hepatitis B vaccine.** / Consult your health care provider.  Haemophilus influenzae type b (Hib) vaccine.** / Consult your health care provider. Ages 63 to 82  Blood pressure check.** / Every 1 to 2 years.  Lipid and cholesterol check.** / Every 5 years beginning at age 25.  Lung cancer screening. / Every year if you are aged 31-80 years and have a 30-pack-year history of smoking and currently smoke or have quit within the past 15 years. Yearly screening is stopped once you have quit smoking for at least 15 years or develop a health problem that would prevent you from having lung cancer treatment.  Fecal occult blood test (FOBT) of stool. / Every year beginning at age 63 and continuing until age 61. You may not have to do this test if you get a colonoscopy every 10 years.  Flexible  sigmoidoscopy** or colonoscopy.** / Every 5 years for a flexible sigmoidoscopy or every 10 years for a colonoscopy beginning at age 62 and continuing until age 87.  Hepatitis C blood test.** /  For all people born from [birth year] through 1965 and any individual with known risks for hepatitis C.  Skin self-exam. / Monthly.  Influenza vaccine. / Every year.  Tetanus, diphtheria, and acellular pertussis (Tdap/Td) vaccine.** / Consult your health care provider. 1 dose of Td every 10 years.  Varicella vaccine.** / Consult your health care provider.  Zoster vaccine.** / 1 dose for adults aged 24 years or older.  Measles, mumps, rubella (MMR) vaccine.** / You need at least 1 dose of MMR if you were born in 1956 or later. You may also need a second dose.  Pneumococcal 13-valent conjugate (PCV13) vaccine.** / Consult your health care provider.  Pneumococcal polysaccharide (PPSV23) vaccine.** / 1 to 2 doses if you smoke cigarettes or if you have certain conditions.  Meningococcal vaccine.** / Consult your health care provider.  Hepatitis A vaccine.** / Consult your health care provider.  Hepatitis B vaccine.** / Consult your health care provider.  Haemophilus influenzae type b (Hib) vaccine.** / Consult your health care provider. Ages 55 and over  Blood pressure check.** / Every 1 to 2 years.  Lipid and cholesterol check.**/ Every 5 years beginning at age 57.  Lung cancer screening. / Every year if you are aged 22-80 years and have a 30-pack-year history of smoking and currently smoke or have quit within the past 15 years. Yearly screening is stopped once you have quit smoking for at least 15 years or develop a health problem that would prevent you from having lung cancer treatment.  Fecal occult blood test (FOBT) of stool. / Every year beginning at age 66 and continuing until age 68. You may not have to do this test if you get a colonoscopy every 10 years.  Flexible sigmoidoscopy** or  colonoscopy.** / Every 5 years for a flexible sigmoidoscopy or every 10 years for a colonoscopy beginning at age 61 and continuing until age 27.  Hepatitis C blood test.** / For all people born from [birth year] through 1964 and any individual with known risks for hepatitis C.  Abdominal aortic aneurysm (AAA) screening.** / A one-time screening for ages 55 to 21 years who are current or former smokers.  Skin self-exam. / Monthly.  Influenza vaccine. / Every year.  Tetanus, diphtheria, and acellular pertussis (Tdap/Td) vaccine.** / 1 dose of Td every 10 years.  Varicella vaccine.** / Consult your health care provider.  Zoster vaccine.** / 1 dose for adults aged 68 years or older.  Pneumococcal 13-valent conjugate (PCV13) vaccine.** / Consult your health care provider.  Pneumococcal polysaccharide (PPSV23) vaccine.** / 1 dose for all adults aged 48 years and older.  Meningococcal vaccine.** / Consult your health care provider.  Hepatitis A vaccine.** / Consult your health care provider.  Hepatitis B vaccine.** / Consult your health care provider.  Haemophilus influenzae type b (Hib) vaccine.** / Consult your health care provider. **Family history and personal history of risk and conditions may change your health care provider's recommendations. Document Released: 11/01/2001 Document Revised: 09/10/2013 Document Reviewed: 01/31/2011 [facility] Patient Information 2015 [place], [place]. This information is not intended to replace advice given to you by your health care provider. Make sure you discuss any questions you have with your health care provider.

## 2015-06-15 NOTE — Assessment & Plan Note (Signed)
Will recheck today, encouraged daily supplements

## 2015-06-15 NOTE — Progress Notes (Signed)
Pre visit review using our clinic review tool, if applicable. No additional management support is needed unless otherwise documented below in the visit note. 

## 2015-06-15 NOTE — Progress Notes (Signed)
Patient ID: Tyler Sims, male   DOB: 1955-11-24, 59 y.o.   MRN: [id number]   Subjective:    Patient ID: Tyler Sims, male    DOB: Oct 24, 1955, 59 y.o.   MRN: [id number]  Chief Complaint  Patient presents with  . Annual Exam    HPI Patient is in today for annual exam. He has met with gastroenterology and has his colonoscopy next week. He is set up for a Lovenox bridge secondary to his Coumadin. He continues to follow with Coumadin clinic and his INRs have been stable. No recent illness or acute complaints. He continues to struggle with significant peripheral neuropathy and is having more and more difficulty with his job. His job is becoming increasingly physical as his neuropathy has worsened. He also notes increased low back pain with his physical work at [employer]. Tries to maintain a heart healthy diet. Denies CP/palp/SOB/HA/congestion/fevers/GI or GU c/o. Taking meds as prescribed  Past Medical History  Diagnosis Date  . Stroke 08/24/2010  . Systolic CHF   . Persistent atrial fibrillation 08/24/2010  . NICM (nonischemic cardiomyopathy)   . Bradycardia     nocturnal  . Ventricular tachycardia, non-sustained   . Mitral regurgitation   . Varicella   . Mumps   . Measles (roseola)   . Abnormal transaminases   . Acid reflux   . Colon polyps 02/07/2012    Dr. Silvano Rusk  . HTN (hypertension) 06/02/2014  . Urinary hesitancy 12/21/2014    Past Surgical History  Procedure Laterality Date  . Cardiac catheterization    . Colonoscopy w/ biopsies  02/07/2012    Dr. Silvano Rusk  . Cardioversion  08/15/2012    Procedure: CARDIOVERSION;  Surgeon: Deboraha Sprang, MD;  Location: [facility] ENDOSCOPY;  Service: Cardiovascular;  Laterality: N/A;    Family History  Problem Relation Age of Onset  . Alzheimer's disease Mother   . Lung cancer Brother     x 2, smokers  . Arrhythmia Brother   . Hyperlipidemia Brother   . Hypertension Sister     X31  . Heart disease Father     CAD/MI  .  Hypertension Father   . Mental illness Father     h/o depression requiring hospitalization  . Hyperlipidemia Sister     x 2  . Hypertension Brother     X3  . Heart disease Brother     cardiomyopathy, a. fib  . Colon polyps Brother     Social History   Social History  . Marital Status: Married    Spouse Name: Charlett Nose  . Number of Children: 2  . Years of Education: 16   Occupational History  . Nature conservation officer   .     Social History Main Topics  . Smoking status: Never Smoker   . Smokeless tobacco: Never Used  . Alcohol Use: 0.0 oz/week    0 Standard drinks or equivalent per week     Comment: occasional wine  . Drug Use: No  . Sexual Activity:    Partners: Female     Comment: lives with wife, no dietary restrictions, except avoid dairy   Other Topics Concern  . Not on file   Social History Narrative   Patient is married with 2 children. Store Scientist, clinical (histocompatibility and immunogenetics) [employer]   1 daughter attorney in [place]   Other daughter [employer] grad - pursuing PhD in [place]   Patient is right handed.   Patient has 16 years of education.  Patient drinks 1-2 cups daily.    Outpatient Prescriptions Prior to Visit  Medication Sig Dispense Refill  . COD LIVER OIL PO Take 1 capsule by mouth daily.     . Coenzyme Q10 (CO Q-10) 200 MG CAPS Take 200 mg by mouth daily.     Marland Kitchen gabapentin (NEURONTIN) 300 MG capsule Take 300 mg by mouth 3 (three) times daily.    Marland Kitchen losartan (COZAAR) 25 MG tablet Take 25 mg by mouth daily.    . metoprolol succinate (TOPROL XL) 25 MG 24 hr tablet Take 1 tablet (25 mg total) by mouth daily. 90 tablet 2  . spironolactone (ALDACTONE) 25 MG tablet TAKE 1/2 TABLET BY MOUTH DAILY 30 tablet 2  . warfarin (COUMADIN) 5 MG tablet TAKE AS DIRECTED BY ANTICOAGULATION CLINIC 40 tablet 2  . atorvastatin (LIPITOR) 20 MG tablet TAKE 1 TABLET BY MOUTH ONCE DAILY AT 6PM 90 tablet 1  . enoxaparin (LOVENOX) 150 MG/ML injection Inject 0.99 mLs (150 mg total) into the skin daily. (Patient  not taking: Reported on 06/15/2015) 7 Syringe 0   No facility-administered medications prior to visit.    Allergies  Allergen Reactions  . Lisinopril Rash  . Simvastatin Rash    Review of Systems  Constitutional: Negative for fever and malaise/fatigue.  HENT: Negative for congestion.   Eyes: Negative for discharge.  Respiratory: Negative for shortness of breath.   Cardiovascular: Negative for chest pain, palpitations and leg swelling.  Gastrointestinal: Negative for nausea and abdominal pain.  Genitourinary: Negative for dysuria.  Musculoskeletal: Negative for falls.  Skin: Negative for rash.  Neurological: Negative for loss of consciousness and headaches.  Endo/Heme/Allergies: Negative for environmental allergies.  Psychiatric/Behavioral: Negative for depression. The patient is not nervous/anxious.        Objective:    Physical Exam  Constitutional: He is oriented to person, place, and time. He appears well-developed and well-nourished. No distress.  HENT:  Head: Normocephalic and atraumatic.  Eyes: Conjunctivae are normal.  Neck: Neck supple. No thyromegaly present.  Cardiovascular: Normal rate, regular rhythm and normal heart sounds.   No murmur heard. Pulmonary/Chest: Effort normal and breath sounds normal. No respiratory distress. He has no wheezes.  Abdominal: Soft. Bowel sounds are normal. He exhibits no mass. There is no tenderness.  Musculoskeletal: He exhibits no edema.  Lymphadenopathy:    He has no cervical adenopathy.  Neurological: He is alert and oriented to person, place, and time.  Skin: Skin is warm and dry.  Psychiatric: He has a normal mood and affect. His behavior is normal.    BP 120/74 mmHg  Pulse 63  Temp(Src) 98 F (36.7 C) (Oral)  Ht '6\' 3"'  (1.905 m)  Wt 216 lb 8 oz (98.204 kg)  BMI 27.06 kg/m2  SpO2 97% Wt Readings from Last 3 Encounters:  06/15/15 216 lb 8 oz (98.204 kg)  05/29/15 218 lb 6 oz (99.054 kg)  04/16/15 217 lb (98.431 kg)       Lab Results  Component Value Date   WBC 4.3 12/09/2014   HGB 14.2 12/09/2014   HCT 40.5 12/09/2014   PLT 190.0 12/09/2014   GLUCOSE 87 12/09/2014   CHOL 147 12/09/2014   TRIG 58.0 12/09/2014   HDL 62.70 12/09/2014   LDLDIRECT 110.4 10/03/2012   LDLCALC 73 12/09/2014   ALT 19 12/09/2014   AST 25 12/09/2014   NA 136 12/09/2014   K 4.4 12/09/2014   CL 102 12/09/2014   CREATININE 0.94 12/09/2014   BUN 15  12/09/2014   CO2 30 12/09/2014   TSH 1.16 12/09/2014   PSA 0.95 12/09/2014   INR 2.9 06/09/2015   HGBA1C  08/28/2010    4.7 (NOTE)                                                                       According to the ADA Clinical Practice Recommendations for 2011, when HbA1c is used as a screening test:   >=6.5%   Diagnostic of Diabetes Mellitus           (if abnormal result  is confirmed)  5.7-6.4%   Increased risk of developing Diabetes Mellitus  References:Diagnosis and Classification of Diabetes Mellitus,Diabetes OHFG,9021,11(BZMCE 1):S62-S69 and Standards of Medical Care in         Diabetes - 2011,Diabetes Care,2011,34  (Suppl 1):S11-S61.    Lab Results  Component Value Date   TSH 1.16 12/09/2014   Lab Results  Component Value Date   WBC 4.3 12/09/2014   HGB 14.2 12/09/2014   HCT 40.5 12/09/2014   MCV 92.4 12/09/2014   PLT 190.0 12/09/2014   Lab Results  Component Value Date   NA 136 12/09/2014   K 4.4 12/09/2014   CO2 30 12/09/2014   GLUCOSE 87 12/09/2014   BUN 15 12/09/2014   CREATININE 0.94 12/09/2014   BILITOT 1.3* 12/09/2014   ALKPHOS 65 12/09/2014   AST 25 12/09/2014   ALT 19 12/09/2014   PROT 7.5 12/09/2014   ALBUMIN 4.3 12/09/2014   CALCIUM 9.5 12/09/2014   GFR 106.26 12/09/2014   Lab Results  Component Value Date   CHOL 147 12/09/2014   Lab Results  Component Value Date   HDL 62.70 12/09/2014   Lab Results  Component Value Date   LDLCALC 73 12/09/2014   Lab Results  Component Value Date   TRIG 58.0 12/09/2014   Lab Results   Component Value Date   CHOLHDL 2 12/09/2014   Lab Results  Component Value Date   HGBA1C  08/28/2010    4.7 (NOTE)                                                                       According to the ADA Clinical Practice Recommendations for 2011, when HbA1c is used as a screening test:   >=6.5%   Diagnostic of Diabetes Mellitus           (if abnormal result  is confirmed)  5.7-6.4%   Increased risk of developing Diabetes Mellitus  References:Diagnosis and Classification of Diabetes Mellitus,Diabetes YEMV,3612,24(SLPNP 1):S62-S69 and Standards of Medical Care in         Diabetes - 2011,Diabetes Care,2011,34  (Suppl 1):S11-S61.       Assessment & Plan:   Vitamin D deficiency Will recheck today, encouraged daily supplements  VENTRICULAR TACHYCARDIA RRR today  Hyperlipidemia Tolerating statin, encouraged heart healthy diet, avoid trans fats, minimize simple carbs and saturated fats. Increase exercise as tolerated  HTN (hypertension) Well controlled, no changes to meds. Encouraged  heart healthy diet such as the DASH diet and exercise as tolerated.   Cerebral artery occlusion with cerebral infarction Follows with neurology, no new events.  Hereditary and idiopathic peripheral neuropathy Gets some relief from Gabapentin 300 mg tid, is managed with neurology  Long term current use of anticoagulant therapy Follows with coumadin clinic at [facility], will be bridged for colonoscopy next week  Atrial fibrillation Tolerating coumadin, follows with cardiology  Preventative health care Patient encouraged to maintain heart healthy diet, regular exercise, adequate sleep. Consider daily probiotics. Take medications as prescribed. Given and reviewed copy of ACP documents from [employer] and encouraged to complete and return. Labs ordered today  Low back pain Advised against oral NSAIDs due to coumadin. May use pain patches prn, works a very physical job. Given Robaxin prn for pain  to try qhs, if no sedation may use during the day as well.    I am having Mr. Mctigue maintain his COD LIVER OIL PO, Co Q-10, metoprolol succinate, warfarin, spironolactone, gabapentin, losartan, enoxaparin, and atorvastatin.  Meds ordered this encounter  Medications  . atorvastatin (LIPITOR) 20 MG tablet    Sig: TAKE 1 TABLET BY MOUTH ONCE DAILY AT 6PM    Dispense:  90 tablet    Refill:  [place], LPN

## 2015-06-15 NOTE — Assessment & Plan Note (Signed)
Well controlled, no changes to meds. Encouraged heart healthy diet such as the DASH diet and exercise as tolerated.  °

## 2015-06-15 NOTE — Assessment & Plan Note (Signed)
Tolerating coumadin, follows with cardiology

## 2015-06-15 NOTE — Assessment & Plan Note (Signed)
Patient encouraged to maintain heart healthy diet, regular exercise, adequate sleep. Consider daily probiotics. Take medications as prescribed. Given and reviewed copy of ACP documents from [place] Secretary of State and encouraged to complete and return. Labs ordered today 

## 2015-06-22 ENCOUNTER — Ambulatory Visit (AMBULATORY_SURGERY_CENTER): Payer: Managed Care, Other (non HMO) | Admitting: Internal Medicine

## 2015-06-22 ENCOUNTER — Encounter: Payer: Self-pay | Admitting: Internal Medicine

## 2015-06-22 VITALS — BP 120/77 | HR 49 | Temp 96.5°F | Resp 16 | Ht 74.0 in | Wt 218.0 lb

## 2015-06-22 DIAGNOSIS — Z86010 Personal history of colonic polyps: Secondary | ICD-10-CM

## 2015-06-22 MED ORDER — SODIUM CHLORIDE 0.9 % IV SOLN: 500.0000 mL | INTRAVENOUS | Status: DC

## 2015-06-22 NOTE — Op Note (Signed)
[place]  [address] [place], [id number]   COLONOSCOPY PROCEDURE REPORT  PATIENT: Tyler Sims, Tyler Sims  MR#: [id number] BIRTHDATE: 11-16-1955 , 58  yrs. old GENDER: male ENDOSCOPIST: Gatha Mayer, MD, [facility] PROCEDURE DATE:  06/22/2015 PROCEDURE:   Colonoscopy, surveillance First Screening Colonoscopy - Avg.  risk and is 50 yrs.  old or older - No.  Prior Negative Screening - Now for repeat screening. N/A  History of Adenoma - Now for follow-up colonoscopy & has been > or = to 3 yrs.  Yes hx of adenoma.  Has been 3 or more years since last colonoscopy.  Polyps removed today? No Recommend repeat exam, <10 yrs? Yes high risk ASA CLASS:   Class III INDICATIONS:Surveillance due to prior colonic neoplasia and PH Colon Adenoma. MEDICATIONS: Propofol 250 mg IV and Monitored anesthesia care  DESCRIPTION OF PROCEDURE:   After the risks benefits and alternatives of the procedure were thoroughly explained, informed consent was obtained.  The digital rectal exam revealed no abnormalities of the rectum, revealed no prostatic nodules, and revealed the prostate was not enlarged.   The [id number] endoscope was introduced through the anus and advanced to the cecum, which was identified by both the appendix and ileocecal valve. No adverse events experienced.   The quality of the prep was good.  (MiraLax was used)  The instrument was then slowly withdrawn as the colon was fully examined. Estimated blood loss is zero unless otherwise noted in this procedure report.      COLON FINDINGS: There was mild diverticulosis noted in the sigmoid colon.   The examination was otherwise normal.  Retroflexed views revealed no abnormalities. The time to cecum = 2.1 Withdrawal time = 8.4   The scope was withdrawn and the procedure completed. COMPLICATIONS: There were no immediate complications.  ENDOSCOPIC IMPRESSION: 1.   There was mild diverticulosis noted in the sigmoid  colon 2.   The examination was otherwise normal - good prep  RECOMMENDATIONS: Repeat Colonoscopy in 5 years. 2021 - hx 2 adenomas max 10 mm, 2013  resume warfarin today  eSigned:  Gatha Mayer, MD, [facility] 06/22/2015 8:02 AM   cc: The Patient

## 2015-06-22 NOTE — Progress Notes (Signed)
Transferred to recovery room. A/O x3, pleased with MAC.  VSS.  Report to Jill, RN. 

## 2015-06-22 NOTE — Patient Instructions (Addendum)
No polyps today! Your next routine colonoscopy should be in 5 years - 2021. Here are anti-coagulation treatment recommendations from the clinic   10/3 - NO LOVENOX 10/4 - Lovenox in the morning and 10 mg (2 tablets) of coumadin 10/5 - Lovenox in the morning and 10 mg (2 tablets) of coumadin 10/6 - Lovenox in the morning and 7.5 mg(1 1/2 tablets) of coumadin 10/7 - Lovenox in the morning and 7.5 mg(1 1/2 tablets) of coumadin 10/8 - Stop Lovenox and resume taking coumadin as prescribed.      I appreciate the opportunity to care for you. Tyler Mayer, MD, FACG YOU HAD AN ENDOSCOPIC PROCEDURE TODAY AT [facility]:   Refer to the procedure report that was given to you for any specific questions about what was found during the examination.  If the procedure report does not answer your questions, please call your gastroenterologist to clarify.  If you requested that your care partner not be given the details of your procedure findings, then the procedure report has been included in a sealed envelope for you to review at your convenience later.  YOU SHOULD EXPECT: Some feelings of bloating in the abdomen. Passage of more gas than usual.  Walking can help get rid of the air that was put into your GI tract during the procedure and reduce the bloating. If you had a lower endoscopy (such as a colonoscopy or flexible sigmoidoscopy) you may notice spotting of blood in your stool or on the toilet paper. If you underwent a bowel prep for your procedure, you may not have a normal bowel movement for a few days.  Please Note:  You might notice some irritation and congestion in your nose or some drainage.  This is from the oxygen used during your procedure.  There is no need for concern and it should clear up in a day or so.  SYMPTOMS TO REPORT IMMEDIATELY:   Following lower endoscopy (colonoscopy or flexible sigmoidoscopy):  Excessive amounts of blood in the stool  Significant  tenderness or worsening of abdominal pains  Swelling of the abdomen that is new, acute  Fever of 100F or higher   New shortness of breath  Fever of 100F or higher  Black, tarry-looking stools  For urgent or emergent issues, a gastroenterologist can be reached at any hour by calling [phone].   DIET: Your first meal following the procedure should be a small meal and then it is ok to progress to your normal diet. Heavy or fried foods are harder to digest and may make you feel nauseous or bloated.  Likewise, meals heavy in dairy and vegetables can increase bloating.  Drink plenty of fluids but you should avoid alcoholic beverages for 24 hours.  ACTIVITY:  You should plan to take it easy for the rest of today and you should NOT DRIVE or use heavy machinery until tomorrow (because of the sedation medicines used during the test).    FOLLOW UP: Our staff will call the number listed on your records the next business day following your procedure to check on you and address any questions or concerns that you may have regarding the information given to you following your procedure. If we do not reach you, we will leave a message.  However, if you are feeling well and you are not experiencing any problems, there is no need to return our call.  We will assume that you have returned to your regular daily activities without  incident.  If any biopsies were taken you will be contacted by phone or by letter within the next 1-3 weeks.  Please call us at [phone] if you have not heard about the biopsies in 3 weeks.    SIGNATURES/CONFIDENTIALITY: You and/or your care partner have signed paperwork which will be entered into your electronic medical record.  These signatures attest to the fact that that the information above on your After Visit Summary has been reviewed and is understood.  Full responsibility of the confidentiality of this discharge information lies with you and/or your care-partner.

## 2015-06-23 ENCOUNTER — Other Ambulatory Visit: Payer: Self-pay | Admitting: Neurology

## 2015-06-23 ENCOUNTER — Telehealth: Payer: Self-pay | Admitting: *Deleted

## 2015-06-23 NOTE — Telephone Encounter (Signed)
  Follow up Call-  Call back number 06/22/2015  Post procedure Call Back phone  # [phone]  Permission to leave phone message Yes     Patient questions:  Do you have a fever, pain , or abdominal swelling? No. Pain Score  0 *  Have you tolerated food without any problems? Yes.    Have you been able to return to your normal activities? Yes.    Do you have any questions about your discharge instructions: Diet   No. Medications  No. Follow up visit  No.  Do you have questions or concerns about your Care? No.  Actions: * If pain score is 4 or above: No action needed, pain <4.

## 2015-07-01 ENCOUNTER — Ambulatory Visit (INDEPENDENT_AMBULATORY_CARE_PROVIDER_SITE_OTHER): Payer: Managed Care, Other (non HMO) | Admitting: General Practice

## 2015-07-01 DIAGNOSIS — Z7901 Long term (current) use of anticoagulants: Secondary | ICD-10-CM

## 2015-07-01 DIAGNOSIS — I639 Cerebral infarction, unspecified: Secondary | ICD-10-CM

## 2015-07-01 DIAGNOSIS — I4891 Unspecified atrial fibrillation: Secondary | ICD-10-CM

## 2015-07-01 LAB — POCT INR: INR: 2.2

## 2015-07-01 NOTE — Progress Notes (Signed)
Pre visit review using our clinic review tool, if applicable. No additional management support is needed unless otherwise documented below in the visit note. 

## 2015-07-01 NOTE — Progress Notes (Signed)
I have reviewed and agree with the plan. 

## 2015-07-22 ENCOUNTER — Other Ambulatory Visit: Payer: Self-pay | Admitting: Family Medicine

## 2015-07-22 ENCOUNTER — Other Ambulatory Visit: Payer: Self-pay | Admitting: Internal Medicine

## 2015-07-29 ENCOUNTER — Ambulatory Visit (INDEPENDENT_AMBULATORY_CARE_PROVIDER_SITE_OTHER): Payer: Managed Care, Other (non HMO) | Admitting: General Practice

## 2015-07-29 DIAGNOSIS — Z7901 Long term (current) use of anticoagulants: Secondary | ICD-10-CM | POA: Diagnosis not present

## 2015-07-29 DIAGNOSIS — I4891 Unspecified atrial fibrillation: Secondary | ICD-10-CM

## 2015-07-29 DIAGNOSIS — I639 Cerebral infarction, unspecified: Secondary | ICD-10-CM

## 2015-07-29 LAB — POCT INR: INR: 2.1

## 2015-07-29 NOTE — Progress Notes (Signed)
I have reviewed and agree with the plan. 

## 2015-07-29 NOTE — Progress Notes (Signed)
Pre visit review using our clinic review tool, if applicable. No additional management support is needed unless otherwise documented below in the visit note. 

## 2015-08-18 ENCOUNTER — Other Ambulatory Visit: Payer: Self-pay | Admitting: Family Medicine

## 2015-09-09 ENCOUNTER — Ambulatory Visit: Payer: Managed Care, Other (non HMO)

## 2015-09-16 ENCOUNTER — Ambulatory Visit (INDEPENDENT_AMBULATORY_CARE_PROVIDER_SITE_OTHER): Payer: Managed Care, Other (non HMO) | Admitting: General Practice

## 2015-09-16 DIAGNOSIS — I639 Cerebral infarction, unspecified: Secondary | ICD-10-CM | POA: Diagnosis not present

## 2015-09-16 DIAGNOSIS — I4891 Unspecified atrial fibrillation: Secondary | ICD-10-CM

## 2015-09-16 DIAGNOSIS — Z7901 Long term (current) use of anticoagulants: Secondary | ICD-10-CM

## 2015-09-16 LAB — POCT INR: INR: 2.7

## 2015-09-16 NOTE — Progress Notes (Signed)
I have reviewed and agree with the plan. 

## 2015-09-16 NOTE — Progress Notes (Signed)
Pre visit review using our clinic review tool, if applicable. No additional management support is needed unless otherwise documented below in the visit note. 

## 2015-10-06 ENCOUNTER — Ambulatory Visit (INDEPENDENT_AMBULATORY_CARE_PROVIDER_SITE_OTHER): Payer: Managed Care, Other (non HMO) | Admitting: Adult Health

## 2015-10-06 ENCOUNTER — Encounter: Payer: Self-pay | Admitting: Adult Health

## 2015-10-06 VITALS — BP 130/71 | HR 54 | Resp 20 | Ht 75.0 in | Wt 220.0 lb

## 2015-10-06 DIAGNOSIS — G629 Polyneuropathy, unspecified: Secondary | ICD-10-CM | POA: Diagnosis not present

## 2015-10-06 DIAGNOSIS — Z8673 Personal history of transient ischemic attack (TIA), and cerebral infarction without residual deficits: Secondary | ICD-10-CM | POA: Diagnosis not present

## 2015-10-06 MED ORDER — GABAPENTIN 400 MG PO CAPS: 400.0000 mg | ORAL_CAPSULE | Freq: Three times a day (TID) | ORAL | Status: DC

## 2015-10-06 NOTE — Progress Notes (Signed)
I agree with the assessment and plan as directed by NP .The patient is known to me .   Llewellyn Schoenberger, MD  

## 2015-10-06 NOTE — Progress Notes (Signed)
Tyler Sims: Tyler Sims DOB: [birth year]  REASON FOR VISIT: follow up- stroke and neuropathy  HISTORY FROM: Tyler Sims  HISTORY OF PRESENT ILLNESS:  Tyler Sims is a 60 year old male with a history of stroke and sensory neuropathy. He returns today for follow-up. The Tyler Sims continues on warfarin for secondary stroke prevention. He denies any significant bleeding or bruising. He reports that his blood pressure has remained in normal range. His primary care checks his cholesterol and hemoglobin regularly. He states that he was told that his blood work was in normal range. The Tyler Sims has no residual effects from the stroke. The Tyler Sims reports that he does have neuropathy that extends from the feet to the knees. He states that he will have burning and tingling pain. He states that when he is busy tends to not notice the discomfort however when he is at rest the discomfort seems to worsen. He is currently on gabapentin 300 mg 3 times a day. He feels that he has gotten some benefit from this however he continues to get the burning and tingling pain throughout the day. He denies any changes with his gait or balance. He denies any recent falls. He returns today for an evaluation.  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 Tyler Sims is in quite well with near complete recovery and full functional independence.   UPDATE 02/25/14 (LL): Tyler Sims returns for stroke follow up, last visit was with me on 08/22/13. Continues warfarin tolerating well, has had mild fluctuations of his INR. Carotid Doppler on 03/12/13 was negative. He states that BP is well controlled, in office today is 134/80. Tyler Sims denies medication side effects, with no signs  of bleeding.He does not want to try any medication for it at this time. Recent lipid panel with total cholesterol 133, LDL 58, HDL 68, on Crestor. Tyler Sims is not diabetic. No new neurological symptoms, but complains of burning/sore sensation in his lower legs and feet, over last 1 year. Burning pain is intermittent, worst after standing on his feet at work all day. B12, TSH, and HgbA1c normal. Symptom was still present while on Pravachol.   UPDATE 04/22/14 (LL): Tried trial off statins for 6 weeks, with no change in symptoms, so not likely related to statin use. Dr. Caryl Comes checked Sed rate and CRP, both normal. Total cholesterol off statin rose to 226, LDL 126. Burning in lower legs worse when standing for long hours, does not prevent him from sleeping at night.  Update 12/01/2014 : He returns for follow-up after last visit 7 months ago. He continues to do well from stroke standpoint without any new neurovascular symptoms since his stroke in December 2011. He has made a full recovery and is back to working full-time and has no disability from his stroke. He remains on warfarin which is tolerating well without significant bleeding or bruising. His INR has been quite stable recently. He is also tolerating atorvastatin without side effects and plans on having lipid profile checked next week with his primary physician. He continues to complain of tingling numbness and burning in his feet particularly on days when he's been on his feet for long periods of more noticeable when he is sitting down or lying down quietly. He was recommended a trial of gabapentin or Lyrica  at last visit but chose not to take the medicines. He says that his sister has a severe peripheral neuropathy and he is now worried and would like evaluation for this. He denies significant problems with balance or falls or weakness in his muscles in the legs. Update 03/25/2015 :   He returns for follow-up after last visit 4 months ago. He reports  improvement in his lower extremity paresthesias with gabapentin which he takes 300 mg 3 times daily. He does have good and bad days. When he is active and on his feet he doesn't notice the paresthesias as much but when his resting or on weekends he notices burning in his feet. He is tolerating gabapentinwithout any dizziness, weight gain or sleepiness. He had lab work done on 12/01/14 for neuropathy panel labs all of which were normal except low vitamin D levels of 23.8. He has been seen by his primary physician who has started him on vitamin D and plans to repeat blood work soon. He had follow-up carotid ultrasound done on 01/14/15 which was normal. He is doing well from stroke standpoint without significant recurrent stroke or TIA symptoms. His blood pressure usually is in the 130s though it is slightly elevated at 148/34 in office today. He did have lipid profile checked a few months ago by primary physician and it was fine. His remains on warfarin which is tolerating well without bleeding and bruising and his INR has been quite stable and needs to be checked only every 6 weeks.  REVIEW OF SYSTEMS: Out of a complete 14 system review of symptoms, the Tyler Sims complains only of the following symptoms, and all other reviewed systems are negative.  Muscle cramps, moles  ALLERGIES: Allergies  Allergen Reactions  . Lisinopril Rash  . Simvastatin Rash    HOME MEDICATIONS: Outpatient Prescriptions Prior to Visit  Medication Sig Dispense Refill  . atorvastatin (LIPITOR) 20 MG tablet TAKE 1 TABLET BY MOUTH ONCE DAILY AT 6PM 90 tablet 1  . COD LIVER OIL PO Take 1 capsule by mouth daily.     . Coenzyme Q10 (CO Q-10) 200 MG CAPS Take 200 mg by mouth daily.     Marland Kitchen gabapentin (NEURONTIN) 300 MG capsule Take 300 mg by mouth 3 (three) times daily.    Marland Kitchen losartan (COZAAR) 25 MG tablet Take 25 mg by mouth daily.    . methocarbamol (ROBAXIN) 500 MG tablet Take 1 tablet (500 mg total) by mouth 2 (two) times daily as  needed for muscle spasms. 40 tablet 1  . metoprolol succinate (TOPROL-XL) 25 MG 24 hr tablet TAKE 1 TABLET BY MOUTH EVERY DAY 90 tablet 2  . spironolactone (ALDACTONE) 25 MG tablet TAKE 1/2 TABLET BY MOUTH DAILY 15 tablet 6  . warfarin (COUMADIN) 5 MG tablet TAKE AS DIRECTED BY ANTICOAGULATION CLINIC 40 tablet 2  . enoxaparin (LOVENOX) 150 MG/ML injection Inject 0.99 mLs (150 mg total) into the skin daily. 7 Syringe 0  . losartan (COZAAR) 25 MG tablet Take 1 tablet (25 mg total) by mouth daily. 30 tablet 6   No facility-administered medications prior to visit.    PAST MEDICAL HISTORY: Past Medical History  Diagnosis Date  . Stroke ([place]) 08/24/2010  . Systolic CHF ([place])   . Persistent atrial fibrillation ([place]) 08/24/2010  . NICM (nonischemic cardiomyopathy) ([place])   . Bradycardia     nocturnal  . Ventricular tachycardia, non-sustained ([place])   . Mitral regurgitation   . Varicella   . Mumps   . Measles (  [place])   . Abnormal transaminases   . Acid reflux   . Colon polyps 02/07/2012    Dr. Silvano Rusk  . HTN (hypertension) 06/02/2014  . Urinary hesitancy 12/21/2014  . Low back pain 06/15/2015  . Preventative health care 12/04/2013    Colonsocopy May '13 - adenomatous polyps  Immunizations Tdap Jan '14     PAST SURGICAL HISTORY: Past Surgical History  Procedure Laterality Date  . Cardiac catheterization    . Colonoscopy w/ biopsies  02/07/2012    Dr. Silvano Rusk  . Cardioversion  08/15/2012    Procedure: CARDIOVERSION;  Surgeon: Deboraha Sprang, MD;  Location[facility] END[facility]COPY;  Service: Cardiovascular;  Laterality: N/A;    FAMILY HISTORY: Family History  Problem Relation Age of Onset  . Alzheimer's disease Mother   . Lung cancer Brother     x 2, smokers  . Hypertension Brother   . Heart disease Brother   . Cancer Brother   . Lung cancer Brother   . Hypertension Brother   . Colon polyps Brother   . Hypertension Brother   . Hypertension Sister   . Neuropathy Sister   .  Arthritis Sister   . Heart disease Father     CAD/MI  . Hypertension Father   . Mental illness Father     h/o depression requiring hospitalization  . Hyperlipidemia Sister     x 2  . Hypertension Sister   . Arthritis Sister   . Obesity Sister   . Birth defects Paternal Aunt     SOCIAL HISTORY: Social History   Social History  . Marital Status: Married    Spouse Name: Charlett Nose  . Number of Children: 2  . Years of Education: 16   Occupational History  . Nature conservation officer   .     Social History Main Topics  . Smoking status: Never Smoker   . Smokeless tobacco: Never Used  . Alcohol Use: 0.0 oz/week    0 Standard drinks or equivalent per week     Comment: occasional wine  . Drug Use: No  . Sexual Activity:    Partners: Female     Comment: lives with wife, no dietary restrictions, except avoid dairy, uses [employer], works with [employer]   Other Topics Concern  . Not on file   Social History Narrative   Tyler Sims is married with 2 children. Store Scientist, clinical (histocompatibility and immunogenetics) [employer]   1 daughter attorney in [place]   Other daughter [employer] grad - pursuing PhD in [place]   Tyler Sims is right handed.   Tyler Sims has 16 years of education.   Tyler Sims drinks 1-2 cups daily.      PHYSICAL EXAM  Filed Vitals:   10/06/15 0804  BP: 130/71  Pulse: 54  Resp: 20  Height: '6\' 3"'  (1.905 m)  Weight: 220 lb (99.791 kg)   Body mass index is 27.5 kg/(m^2).  Generalized: Well developed, in no acute distress   Neurological examination  Mentation: Alert oriented to time, place, history taking. Follows all commands speech and language fluent Cranial nerve II-XII: Pupils were equal round reactive to light. Extraocular movements were full, visual field were full on confrontational test. Facial sensation and strength were normal. Uvula tongue midline. Head turning and shoulder shrug  were normal and symmetric. Motor: The motor testing reveals 5 over 5 strength of all 4 extremities. Good symmetric  motor tone is noted throughout.  Sensory: Sensory testing is intact to soft touch on all 4  extremities. Pinprick sensation intact on all 4 extremities. Vibration sensation intact on all 4 extremities. No evidence of extinction is noted.  Coordination: Cerebellar testing reveals good finger-nose-finger and heel-to-shin bilaterally.  Gait and station: Gait is normal. Tandem gait is normal. Romberg is negative. No drift is seen.  Reflexes: Deep tendon reflexes are symmetric and normal bilaterally.   DIAGNOSTIC DATA (LABS, IMAGING, TESTING) - I reviewed Tyler Sims records, labs, notes, testing and imaging myself where available.  Lab Results  Component Value Date   WBC 4.5 06/15/2015   HGB 14.0 06/15/2015   HCT 41.3 06/15/2015   MCV 93.0 06/15/2015   PLT 176.0 06/15/2015      Component Value Date/Time   NA 140 06/15/2015 0945   K 4.1 06/15/2015 0945   CL 104 06/15/2015 0945   CO2 28 06/15/2015 0945   GLUCOSE 103* 06/15/2015 0945   BUN 16 06/15/2015 0945   CREATININE 0.89 06/15/2015 0945   CALCIUM 9.8 06/15/2015 0945   PROT 7.9 06/15/2015 0945   PROT 7.5 12/01/2014 0926   ALBUMIN 4.2 06/15/2015 0945   AST 25 06/15/2015 0945   ALT 20 06/15/2015 0945   ALKPHOS 70 06/15/2015 0945   BILITOT 1.5* 06/15/2015 0945   GFRNONAA >60 10/28/2010 1047   GFRAA  10/28/2010 1047    >60        The eGFR has been calculated using the MDRD equation. This calculation has not been validated in all clinical situations. eGFR's persistently <60 mL/min signify possible Chronic Kidney Disease.   Lab Results  Component Value Date   CHOL 143 06/15/2015   HDL 60.10 06/15/2015   LDLCALC 70 06/15/2015   LDLDIRECT 110.4 10/03/2012   TRIG 64.0 06/15/2015   CHOLHDL 2 06/15/2015    Lab Results  Component Value Date   VITAMINB12 615 12/01/2014   Lab Results  Component Value Date   TSH 1.37 06/15/2015      ASSESSMENT AND PLAN 60 y.o. year old male  has a past medical history of Stroke ([place])  (08/24/2010); Systolic CHF ([place]); Persistent atrial fibrillation ([place]) (08/24/2010); NICM (nonischemic cardiomyopathy) ([place]); Bradycardia; Ventricular tachycardia, non-sustained ([place]); Mitral regurgitation; Varicella; Mumps; Measles ([place]); Abnormal transaminases; Acid reflux; Colon polyps (02/07/2012); HTN (hypertension) (06/02/2014); Urinary hesitancy (12/21/2014); Low back pain (06/15/2015); and Preventative health care (12/04/2013). here with:  1. History of stroke 2. Sensory neuropathy  Overall the Tyler Sims is doing well. He will continue on warfarin for secondary stroke prevention. He should maintain strict control of his blood pressure with goal less than 130/90. His cholesterol LDL should be less than 100 and hemoglobin A1c less than 6.5%. We will increase his gabapentin to 400 mg 3 times a day. He will let me know if this is not beneficial. Tyler Sims advised that if his symptoms worsen or he develops any new symptoms he should let us know. He will follow-up in 6 months or sooner if needed.     Ward Givens, MSN, NP-C 10/06/2015, 8:08 AM [facility] [address] [place], [place] [address] [phone]

## 2015-10-06 NOTE — Patient Instructions (Signed)
Continue Warfarin for stroke prevention Keep blood pressure less than 130/90 Keep Cholesterol LDL <100 HgA1C < 6.5% Increase gabapentin to 400 mg three times a day If your symptoms worsen or you develop new symptoms please let us know.

## 2015-10-11 ENCOUNTER — Other Ambulatory Visit: Payer: Self-pay | Admitting: Family Medicine

## 2015-10-11 NOTE — Telephone Encounter (Signed)
I have prescribed before but the best plan is for coumadin clinic to do it. Did decide to fill a 30 day supply while we straighten this out

## 2015-10-11 NOTE — Telephone Encounter (Signed)
Please get this rx to his coumadin clinic, I should not prescribe coumadin if I do not monitor it. I have sent it back to the pharmacy as well. If he is about to run out I can give a 30 day supply once so he does not get in trouble while I get this straightened out.

## 2015-10-28 ENCOUNTER — Ambulatory Visit (INDEPENDENT_AMBULATORY_CARE_PROVIDER_SITE_OTHER): Payer: Managed Care, Other (non HMO) | Admitting: General Practice

## 2015-10-28 DIAGNOSIS — Z7901 Long term (current) use of anticoagulants: Secondary | ICD-10-CM

## 2015-10-28 DIAGNOSIS — I639 Cerebral infarction, unspecified: Secondary | ICD-10-CM

## 2015-10-28 DIAGNOSIS — I4891 Unspecified atrial fibrillation: Secondary | ICD-10-CM | POA: Diagnosis not present

## 2015-10-28 LAB — POCT INR: INR: 2.4

## 2015-10-28 NOTE — Progress Notes (Signed)
Pre visit review using our clinic review tool, if applicable. No additional management support is needed unless otherwise documented below in the visit note. 

## 2015-10-28 NOTE — Progress Notes (Signed)
I have reviewed and agree with the plan. 

## 2015-12-05 ENCOUNTER — Other Ambulatory Visit: Payer: Self-pay | Admitting: Family Medicine

## 2015-12-09 ENCOUNTER — Ambulatory Visit (INDEPENDENT_AMBULATORY_CARE_PROVIDER_SITE_OTHER): Payer: Managed Care, Other (non HMO) | Admitting: General Practice

## 2015-12-09 DIAGNOSIS — Z7901 Long term (current) use of anticoagulants: Secondary | ICD-10-CM | POA: Diagnosis not present

## 2015-12-09 DIAGNOSIS — I4891 Unspecified atrial fibrillation: Secondary | ICD-10-CM

## 2015-12-09 DIAGNOSIS — I639 Cerebral infarction, unspecified: Secondary | ICD-10-CM

## 2015-12-09 LAB — POCT INR: INR: 2.8

## 2015-12-09 NOTE — Progress Notes (Signed)
Pre visit review using our clinic review tool, if applicable. No additional management support is needed unless otherwise documented below in the visit note. 

## 2015-12-09 NOTE — Progress Notes (Signed)
I have reviewed and agree with the plan. 

## 2015-12-14 ENCOUNTER — Ambulatory Visit: Payer: Managed Care, Other (non HMO) | Admitting: Family Medicine

## 2015-12-23 ENCOUNTER — Ambulatory Visit (INDEPENDENT_AMBULATORY_CARE_PROVIDER_SITE_OTHER): Payer: Managed Care, Other (non HMO) | Admitting: Family Medicine

## 2015-12-23 ENCOUNTER — Encounter: Payer: Self-pay | Admitting: Family Medicine

## 2015-12-23 VITALS — BP 142/74 | HR 60 | Temp 98.4°F | Ht 75.0 in | Wt 221.6 lb

## 2015-12-23 DIAGNOSIS — I482 Chronic atrial fibrillation, unspecified: Secondary | ICD-10-CM

## 2015-12-23 DIAGNOSIS — E559 Vitamin D deficiency, unspecified: Secondary | ICD-10-CM

## 2015-12-23 DIAGNOSIS — E782 Mixed hyperlipidemia: Secondary | ICD-10-CM

## 2015-12-23 DIAGNOSIS — I10 Essential (primary) hypertension: Secondary | ICD-10-CM | POA: Diagnosis not present

## 2015-12-23 DIAGNOSIS — E785 Hyperlipidemia, unspecified: Secondary | ICD-10-CM

## 2015-12-23 DIAGNOSIS — G629 Polyneuropathy, unspecified: Secondary | ICD-10-CM

## 2015-12-23 LAB — COMPREHENSIVE METABOLIC PANEL
ALBUMIN: 4.3 g/dL (ref 3.5–5.2)
ALK PHOS: 62 U/L (ref 39–117)
ALT: 23 U/L (ref 0–53)
AST: 27 U/L (ref 0–37)
BUN: 22 mg/dL (ref 6–23)
CHLORIDE: 103 meq/L (ref 96–112)
CO2: 28 mEq/L (ref 19–32)
Calcium: 10 mg/dL (ref 8.4–10.5)
Creatinine, Ser: 1.01 mg/dL (ref 0.40–1.50)
GFR: 97.45 mL/min (ref >60.00)
Glucose, Bld: 92 mg/dL (ref 70–99)
Potassium: 4.6 mEq/L (ref 3.5–5.1)
Sodium: 137 mEq/L (ref 135–145)
TOTAL PROTEIN: 7.9 g/dL (ref 6.0–8.3)
Total Bilirubin: 1.2 mg/dL (ref 0.2–1.2)

## 2015-12-23 LAB — LIPID PANEL
CHOLESTEROL: 151 mg/dL (ref 0–200)
HDL: 58.5 mg/dL (ref >39.00)
LDL CALC: 79 mg/dL (ref 0–99)
NonHDL: 92.27
TRIGLYCERIDES: 68 mg/dL (ref 0.0–149.0)
Total CHOL/HDL Ratio: 3
VLDL: 13.6 mg/dL (ref 0.0–40.0)

## 2015-12-23 LAB — VITAMIN D 25 HYDROXY (VIT D DEFICIENCY, FRACTURES): VITD: 57.08 ng/mL (ref 30.00–100.00)

## 2015-12-23 LAB — TSH: TSH: 1.14 u[IU]/mL (ref 0.35–4.50)

## 2015-12-23 NOTE — Assessment & Plan Note (Signed)
Tolerating warfarin rate controlle

## 2015-12-23 NOTE — Patient Instructions (Addendum)
NOW probiotic daily at [facility] or [email or web address] Metamucil/Psyllium powder daily 64 oz of clear fluids daily Melatonin 1-2 mg at bed Minimize sodium and caffeine  Hypertension Hypertension, commonly called high blood pressure, is when the force of blood pumping through your arteries is too strong. Your arteries are the blood vessels that carry blood from your heart throughout your body. A blood pressure reading consists of a higher number over a lower number, such as 110/72. The higher number (systolic) is the pressure inside your arteries when your heart pumps. The lower number (diastolic) is the pressure inside your arteries when your heart relaxes. Ideally you want your blood pressure below 120/80. Hypertension forces your heart to work harder to pump blood. Your arteries may become narrow or stiff. Having untreated or uncontrolled hypertension can cause heart attack, stroke, kidney disease, and other problems. RISK FACTORS Some risk factors for high blood pressure are controllable. Others are not.  Risk factors you cannot control include:   Race. You may be at higher risk if you are African American.  Age. Risk increases with age.  Gender. Men are at higher risk than women before age 54 years. After age 75, women are at higher risk than men. Risk factors you can control include:  Not getting enough exercise or physical activity.  Being overweight.  Getting too much fat, sugar, calories, or salt in your diet.  Drinking too much alcohol. SIGNS AND SYMPTOMS Hypertension does not usually cause signs or symptoms. Extremely high blood pressure (hypertensive crisis) may cause headache, anxiety, shortness of breath, and nosebleed. DIAGNOSIS To check if you have hypertension, your health care provider will measure your blood pressure while you are seated, with your arm held at the level of your heart. It should be measured at least twice using the same arm. Certain conditions can  cause a difference in blood pressure between your right and left arms. A blood pressure reading that is higher than normal on one occasion does not mean that you need treatment. If it is not clear whether you have high blood pressure, you may be asked to return on a different day to have your blood pressure checked again. Or, you may be asked to monitor your blood pressure at home for 1 or more weeks. TREATMENT Treating high blood pressure includes making lifestyle changes and possibly taking medicine. Living a healthy lifestyle can help lower high blood pressure. You may need to change some of your habits. Lifestyle changes may include:  Following the DASH diet. This diet is high in fruits, vegetables, and whole grains. It is low in salt, red meat, and added sugars.  Keep your sodium intake below 2,300 mg per day.  Getting at least 30-45 minutes of aerobic exercise at least 4 times per week.  Losing weight if necessary.  Not smoking.  Limiting alcoholic beverages.  Learning ways to reduce stress. Your health care provider may prescribe medicine if lifestyle changes are not enough to get your blood pressure under control, and if one of the following is true:  You are 97-41 years of age and your systolic blood pressure is above 140.  You are 20 years of age or older, and your systolic blood pressure is above 150.  Your diastolic blood pressure is above 90.  You have diabetes, and your systolic blood pressure is over [id number] or your diastolic blood pressure is over 90.  You have kidney disease and your blood pressure is above 140/90.  You have heart disease  and your blood pressure is above 140/90. Your personal target blood pressure may vary depending on your medical conditions, your age, and other factors. HOME CARE INSTRUCTIONS  Have your blood pressure rechecked as directed by your health care provider.   Take medicines only as directed by your health care provider. Follow the  directions carefully. Blood pressure medicines must be taken as prescribed. The medicine does not work as well when you skip doses. Skipping doses also puts you at risk for problems.  Do not smoke.   Monitor your blood pressure at home as directed by your health care provider. SEEK MEDICAL CARE IF:   You think you are having a reaction to medicines taken.  You have recurrent headaches or feel dizzy.  You have swelling in your ankles.  You have trouble with your vision. SEEK IMMEDIATE MEDICAL CARE IF:  You develop a severe headache or confusion.  You have unusual weakness, numbness, or feel faint.  You have severe chest or abdominal pain.  You vomit repeatedly.  You have trouble breathing. MAKE SURE YOU:   Understand these instructions.  Will watch your condition.  Will get help right away if you are not doing well or get worse.   This information is not intended to replace advice given to you by your health care provider. Make sure you discuss any questions you have with your health care provider.   Document Released: 09/05/2005 Document Revised: 01/20/2015 Document Reviewed: 06/28/2013 Elsevier Interactive Patient Education [employer].

## 2015-12-23 NOTE — Progress Notes (Signed)
Pre visit review using our clinic review tool, if applicable. No additional management support is needed unless otherwise documented below in the visit note. 

## 2015-12-28 ENCOUNTER — Other Ambulatory Visit: Payer: Self-pay | Admitting: Family Medicine

## 2015-12-30 ENCOUNTER — Other Ambulatory Visit: Payer: Self-pay | Admitting: Family Medicine

## 2016-01-10 ENCOUNTER — Encounter: Payer: Self-pay | Admitting: Family Medicine

## 2016-01-10 DIAGNOSIS — G629 Polyneuropathy, unspecified: Secondary | ICD-10-CM

## 2016-01-10 HISTORY — DX: Polyneuropathy, unspecified: G62.9

## 2016-01-10 NOTE — Assessment & Plan Note (Signed)
Tolerating statin, encouraged heart healthy diet, avoid trans fats, minimize simple carbs and saturated fats. Increase exercise as tolerated 

## 2016-01-10 NOTE — Progress Notes (Signed)
Patient ID: Tyler Sims, male   DOB: 1956-04-21, 60 y.o.   MRN: [id number]   Subjective:    Patient ID: Tyler Sims, male    DOB: 04-25-1956, 60 y.o.   MRN: [id number]  Chief Complaint  Patient presents with  . Hypertension    Fasting for labs--No concerns  . Hyperlipidemia    HPI Patient is in today for follow up. He is feeling well today. No recent illness or acute concerns. He continues to struggle with neuropathic symptoms in legs but they are tolerable. Denies CP/palp/SOB/HA/congestion/fevers/GI or GU c/o. Taking meds as prescribed  Past Medical History  Diagnosis Date  . Stroke ([place]) 08/24/2010  . Systolic CHF ([place])   . Persistent atrial fibrillation ([place]) 08/24/2010  . NICM (nonischemic cardiomyopathy) ([place])   . Bradycardia     nocturnal  . Ventricular tachycardia, non-sustained ([place])   . Mitral regurgitation   . Varicella   . Mumps   . Measles ([place])   . Abnormal transaminases   . Acid reflux   . Colon polyps 02/07/2012    Dr. Silvano Rusk  . HTN (hypertension) 06/02/2014  . Urinary hesitancy 12/21/2014  . Low back pain 06/15/2015  . Preventative health care 12/04/2013    Colonsocopy May '13 - adenomatous polyps  Immunizations Tdap Jan '14   . Neuropathy ([place]) 01/10/2016    Past Surgical History  Procedure Laterality Date  . Cardiac catheterization    . Colonoscopy w/ biopsies  02/07/2012    Dr. Silvano Rusk  . Cardioversion  08/15/2012    Procedure: CARDIOVERSION;  Surgeon: Deboraha Sprang, MD;  Location: [facility] ENDOSCOPY;  Service: Cardiovascular;  Laterality: N/A;    Family History  Problem Relation Age of Onset  . Alzheimer's disease Mother   . Lung cancer Brother     x 2, smokers  . Hypertension Brother   . Heart disease Brother   . Cancer Brother   . Lung cancer Brother   . Hypertension Brother   . Colon polyps Brother   . Hypertension Brother   . Hypertension Sister   . Neuropathy Sister   . Arthritis Sister   . Heart disease Father     CAD/MI   . Hypertension Father   . Mental illness Father     h/o depression requiring hospitalization  . Hyperlipidemia Sister     x 2  . Hypertension Sister   . Arthritis Sister   . Obesity Sister   . Birth defects Paternal Aunt     Social History   Social History  . Marital Status: Married    Spouse Name: Charlett Nose  . Number of Children: 2  . Years of Education: 16   Occupational History  . Nature conservation officer   .     Social History Main Topics  . Smoking status: Never Smoker   . Smokeless tobacco: Never Used  . Alcohol Use: 0.0 oz/week    0 Standard drinks or equivalent per week     Comment: occasional wine  . Drug Use: No  . Sexual Activity:    Partners: Female     Comment: lives with wife, no dietary restrictions, except avoid dairy, uses [employer], works with [employer]   Other Topics Concern  . Not on file   Social History Narrative   Patient is married with 2 children. 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   Patient has 16 years of education.   Patient drinks 1-2 cups daily.    Outpatient Prescriptions Prior to Visit  Medication Sig Dispense Refill  . atorvastatin (LIPITOR) 20 MG tablet TAKE 1 TABLET BY MOUTH ONCE DAILY AT 6PM 90 tablet 1  . COD LIVER OIL PO Take 1 capsule by mouth daily.     . Coenzyme Q10 (CO Q-10) 200 MG CAPS Take 200 mg by mouth daily.     Marland Kitchen gabapentin (NEURONTIN) 400 MG capsule Take 1 capsule (400 mg total) by mouth 3 (three) times daily. 90 capsule 3  . losartan (COZAAR) 25 MG tablet Take 25 mg by mouth daily.    . methocarbamol (ROBAXIN) 500 MG tablet Take 1 tablet (500 mg total) by mouth 2 (two) times daily as needed for muscle spasms. 40 tablet 1  . metoprolol succinate (TOPROL-XL) 25 MG 24 hr tablet TAKE 1 TABLET BY MOUTH EVERY DAY 90 tablet 2  . spironolactone (ALDACTONE) 25 MG tablet TAKE 1/2 TABLET BY MOUTH DAILY 15 tablet 6  .  warfarin (COUMADIN) 5 MG tablet TAKE AS DIRECTED BY ANTICOAGULATION CLINIC 40 tablet 0   No facility-administered medications prior to visit.    Allergies  Allergen Reactions  . Lisinopril Rash  . Simvastatin Rash    Review of Systems  Constitutional: Negative for fever and malaise/fatigue.  HENT: Negative for congestion.   Eyes: Negative for blurred vision.  Respiratory: Negative for shortness of breath.   Cardiovascular: Negative for chest pain, palpitations and leg swelling.  Gastrointestinal: Negative for nausea, abdominal pain and blood in stool.  Genitourinary: Negative for dysuria and frequency.  Musculoskeletal: Negative for falls.  Skin: Negative for rash.  Neurological: Positive for sensory change. Negative for dizziness, loss of consciousness and headaches.  Endo/Heme/Allergies: Negative for environmental allergies.  Psychiatric/Behavioral: Negative for depression. The patient is not nervous/anxious.        Objective:    Physical Exam  Constitutional: He is oriented to person, place, and time. He appears well-developed and well-nourished. No distress.  HENT:  Head: Normocephalic and atraumatic.  Nose: Nose normal.  Eyes: Right eye exhibits no discharge. Left eye exhibits no discharge.  Neck: Normal range of motion. Neck supple.  Cardiovascular: Normal rate.   No murmur heard. Pulmonary/Chest: Effort normal and breath sounds normal.  Abdominal: Soft. Bowel sounds are normal. There is no tenderness.  Musculoskeletal: He exhibits no edema.  Neurological: He is alert and oriented to person, place, and time.  Skin: Skin is warm and dry.  Psychiatric: He has a normal mood and affect.  Nursing note and vitals reviewed.   BP 142/74 mmHg  Pulse 60  Temp(Src) 98.4 F (36.9 C) (Oral)  Ht 6\' 3"  (1.905 m)  Wt 221 lb 9.6 oz (100.517 kg)  BMI 27.70 kg/m2  SpO2 96% Wt Readings from Last 3 Encounters:  12/23/15 221 lb 9.6 oz (100.517 kg)  10/06/15 220 lb (99.791 kg)   06/22/15 218 lb (98.884 kg)     Lab Results  Component Value Date   WBC 4.5 06/15/2015   HGB 14.0 06/15/2015   HCT 41.3 06/15/2015   PLT 176.0 06/15/2015   GLUCOSE 92 12/23/2015   CHOL 151 12/23/2015   TRIG 68.0 12/23/2015   HDL 58.50 12/23/2015   LDLDIRECT 110.4 10/03/2012   LDLCALC 79 12/23/2015   ALT 23 12/23/2015   AST 27 12/23/2015   NA 137 12/23/2015   K 4.6 12/23/2015   CL 103 12/23/2015  CREATININE 1.01 12/23/2015   BUN 22 12/23/2015   CO2 28 12/23/2015   TSH 1.14 12/23/2015   PSA 0.95 12/09/2014   INR 2.8 12/09/2015   HGBA1C  08/28/2010    4.7 (NOTE)                                                                       According to the ADA Clinical Practice Recommendations for 2011, when HbA1c is used as a screening test:   >=6.5%   Diagnostic of Diabetes Mellitus           (if abnormal result  is confirmed)  5.7-6.4%   Increased risk of developing Diabetes Mellitus  References:Diagnosis and Classification of Diabetes Mellitus,Diabetes [id number] 1):S62-S69 and Standards of Medical Care in         Diabetes - 2011,Diabetes Care,2011,34  (Suppl 1):S11-S61.    Lab Results  Component Value Date   TSH 1.14 12/23/2015   Lab Results  Component Value Date   WBC 4.5 06/15/2015   HGB 14.0 06/15/2015   HCT 41.3 06/15/2015   MCV 93.0 06/15/2015   PLT 176.0 06/15/2015   Lab Results  Component Value Date   NA 137 12/23/2015   K 4.6 12/23/2015   CO2 28 12/23/2015   GLUCOSE 92 12/23/2015   BUN 22 12/23/2015   CREATININE 1.01 12/23/2015   BILITOT 1.2 12/23/2015   ALKPHOS 62 12/23/2015   AST 27 12/23/2015   ALT 23 12/23/2015   PROT 7.9 12/23/2015   ALBUMIN 4.3 12/23/2015   CALCIUM 10.0 12/23/2015   GFR 97.45 12/23/2015   Lab Results  Component Value Date   CHOL 151 12/23/2015   Lab Results  Component Value Date   HDL 58.50 12/23/2015   Lab Results  Component Value Date   LDLCALC 79 12/23/2015   Lab Results  Component Value Date   TRIG  68.0 12/23/2015   Lab Results  Component Value Date   CHOLHDL 3 12/23/2015   Lab Results  Component Value Date   HGBA1C  08/28/2010    4.7 (NOTE)                                                                       According to the ADA Clinical Practice Recommendations for 2011, when HbA1c is used as a screening test:   >=6.5%   Diagnostic of Diabetes Mellitus           (if abnormal result  is confirmed)  5.7-6.4%   Increased risk of developing Diabetes Mellitus  References:Diagnosis and Classification of Diabetes Mellitus,Diabetes D8842878 1):S62-S69 and Standards of Medical Care in         Diabetes - 2011,Diabetes Care,2011,34  (Suppl 1):S11-S61.       Assessment & Plan:   Problem List Items Addressed This Visit    Atrial fibrillation ([place])    Tolerating warfarin rate controlle      HTN (hypertension)    Well controlled, no changes to meds. Encouraged heart healthy  diet such as the DASH diet and exercise as tolerated.       Relevant Orders   Lipid panel (Completed)   Comprehensive metabolic panel (Completed)   TSH (Completed)   Vitamin D (25 hydroxy) (Completed)   Hyperlipidemia    Tolerating statin, encouraged heart healthy diet, avoid trans fats, minimize simple carbs and saturated fats. Increase exercise as tolerated      Neuropathy (HCC)    Has been treated with Gabapentin previously, now doing OK without meds      Vitamin D deficiency    Daily supplements.       Relevant Orders   TSH (Completed)   Vitamin D (25 hydroxy) (Completed)    Other Visit Diagnoses    Hyperlipidemia, mixed    -  Primary    Relevant Orders    Lipid panel (Completed)    Comprehensive metabolic panel (Completed)    TSH (Completed)    Vitamin D (25 hydroxy) (Completed)       I am having Mr. Turk maintain his COD LIVER OIL PO, Co Q-10, losartan, methocarbamol, spironolactone, metoprolol succinate, gabapentin, and atorvastatin.  No orders of the defined types were  placed in this encounter.     Penni Homans, MD

## 2016-01-10 NOTE — Assessment & Plan Note (Signed)
Daily supplements 

## 2016-01-10 NOTE — Assessment & Plan Note (Signed)
Has been treated with Gabapentin previously, now doing OK without meds

## 2016-01-10 NOTE — Assessment & Plan Note (Signed)
Well controlled, no changes to meds. Encouraged heart healthy diet such as the DASH diet and exercise as tolerated.  °

## 2016-01-20 ENCOUNTER — Ambulatory Visit: Payer: Managed Care, Other (non HMO)

## 2016-01-21 ENCOUNTER — Other Ambulatory Visit: Payer: Self-pay | Admitting: Adult Health

## 2016-01-26 ENCOUNTER — Other Ambulatory Visit: Payer: Self-pay | Admitting: Family Medicine

## 2016-01-26 MED ORDER — WARFARIN SODIUM 5 MG PO TABS: ORAL_TABLET | ORAL | Status: DC

## 2016-01-27 ENCOUNTER — Ambulatory Visit (INDEPENDENT_AMBULATORY_CARE_PROVIDER_SITE_OTHER): Payer: Managed Care, Other (non HMO) | Admitting: General Practice

## 2016-01-27 DIAGNOSIS — I639 Cerebral infarction, unspecified: Secondary | ICD-10-CM

## 2016-01-27 DIAGNOSIS — I4891 Unspecified atrial fibrillation: Secondary | ICD-10-CM | POA: Diagnosis not present

## 2016-01-27 DIAGNOSIS — Z7901 Long term (current) use of anticoagulants: Secondary | ICD-10-CM

## 2016-01-27 LAB — POCT INR: INR: 2.4

## 2016-01-27 NOTE — Progress Notes (Signed)
I have reviewed and agree with the plan. 

## 2016-01-27 NOTE — Progress Notes (Signed)
Pre visit review using our clinic review tool, if applicable. No additional management support is needed unless otherwise documented below in the visit note. 

## 2016-01-30 ENCOUNTER — Other Ambulatory Visit: Payer: Self-pay | Admitting: Internal Medicine

## 2016-02-24 ENCOUNTER — Other Ambulatory Visit: Payer: Self-pay | Admitting: Family Medicine

## 2016-03-02 ENCOUNTER — Ambulatory Visit (INDEPENDENT_AMBULATORY_CARE_PROVIDER_SITE_OTHER): Payer: Managed Care, Other (non HMO) | Admitting: General Practice

## 2016-03-02 DIAGNOSIS — I639 Cerebral infarction, unspecified: Secondary | ICD-10-CM

## 2016-03-02 DIAGNOSIS — I4891 Unspecified atrial fibrillation: Secondary | ICD-10-CM | POA: Diagnosis not present

## 2016-03-02 DIAGNOSIS — Z7901 Long term (current) use of anticoagulants: Secondary | ICD-10-CM

## 2016-03-02 LAB — POCT INR: INR: 2.5

## 2016-03-02 NOTE — Progress Notes (Signed)
Pre visit review using our clinic review tool, if applicable. No additional management support is needed unless otherwise documented below in the visit note. 

## 2016-03-02 NOTE — Progress Notes (Signed)
I have reviewed and agree with the plan. 

## 2016-03-09 ENCOUNTER — Ambulatory Visit: Payer: Managed Care, Other (non HMO)

## 2016-03-22 ENCOUNTER — Other Ambulatory Visit: Payer: Self-pay | Admitting: Family Medicine

## 2016-04-01 ENCOUNTER — Other Ambulatory Visit: Payer: Self-pay | Admitting: Internal Medicine

## 2016-04-05 ENCOUNTER — Encounter: Payer: Self-pay | Admitting: Adult Health

## 2016-04-05 ENCOUNTER — Ambulatory Visit (INDEPENDENT_AMBULATORY_CARE_PROVIDER_SITE_OTHER): Payer: Managed Care, Other (non HMO) | Admitting: Adult Health

## 2016-04-05 VITALS — BP 140/89 | HR 64 | Ht 75.0 in | Wt 218.6 lb

## 2016-04-05 DIAGNOSIS — G629 Polyneuropathy, unspecified: Secondary | ICD-10-CM

## 2016-04-05 DIAGNOSIS — Z8673 Personal history of transient ischemic attack (TIA), and cerebral infarction without residual deficits: Secondary | ICD-10-CM | POA: Diagnosis not present

## 2016-04-05 MED ORDER — GABAPENTIN 600 MG PO TABS: 600.0000 mg | ORAL_TABLET | Freq: Three times a day (TID) | ORAL | Status: DC

## 2016-04-05 NOTE — Progress Notes (Signed)
I agree with the above plan 

## 2016-04-05 NOTE — Patient Instructions (Signed)
Continue Coumadin Blood pressure <130/90 Cholesterol LDL <70 HgbA1c <6.5 % Increase gabapentin to 600 mg TID If your symptoms worsen or you develop new symptoms please let us know.

## 2016-04-05 NOTE — Progress Notes (Signed)
PATIENT: Tyler Sims DOB: [birth year]  REASON FOR VISIT: follow up- stroke, sensory neuropathy HISTORY FROM: patient  HISTORY OF PRESENT ILLNESS: Today 04/05/2016: Tyler Sims is a 60 year old male with a history of stroke and sensory neuropathy. He returns today for follow-up. At the last visit gabapentin was increased to 400 mg 3 times a day. He states that this helped a little although he continues to have the burning sensation continuously. He states that it is primarily located below the knee on both legs. He reports that it occurs throughout the day when he is sitting or standing. He states that it is tolerable but is constant. He continues on Coumadin. Denies any significant bruising or bleeding. His primary care is managing his blood pressure and cholesterol. He does not smoke. He returns today for an evaluation.  HISTORY 10/06/15 (MM): Tyler Sims is a 60 year old male with a history of stroke and sensory neuropathy. He returns today for follow-up. The patient continues on warfarin for secondary stroke prevention. He denies any significant bleeding or bruising. He reports that his blood pressure has remained in normal range. His primary care checks his cholesterol and hemoglobin regularly. He states that he was told that his blood work was in normal range. The patient has no residual effects from the stroke. The patient reports that he does have neuropathy that extends from the feet to the knees. He states that he will have burning and tingling pain. He states that when he is busy tends to not notice the discomfort however when he is at rest the discomfort seems to worsen. He is currently on gabapentin 300 mg 3 times a day. He feels that he has gotten some benefit from this however he continues to get the burning and tingling pain throughout the day. He denies any changes with his gait or balance. He denies any recent falls. He returns today for an evaluation.  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 Patient is in quite well with near complete recovery and full functional independence.  Returns for followup since last visit on 03/15/2012. He has noticed right knee pain with some numbness to great toe and second, third toe in the last month. He does work standing for long periods. Denies back pain, tingling or weakness. Continues warfarin tolerating well, has had mild fluctuations of his INR. He also had cardioversion for atrial fibrillation in which he is converted to a regular rhythm on 08/15/2012. Blood pressure at home ranges 937D to 428J systolic; today in office 168/86. Labs done, his LDLs 91, total cholesterol 174, next appointment with PCP January 2014. He has had a 5 pound weight gain since his last visit.   04/05/2012 transcranial Doppler is consistent with signs of mild stenosis involving the proximal EA. An abnormally elevated CBFV's in the right/left M1s, A1s, C1s and proximal BA, possibly due to minor stenotic narrowing of these vessels.   04/05/2012 carotid Doppler showed an abnormally elevated blood flow velocities seen in the right/left proximal CCAs and consistent with 50-69% stenosis of these vessels, but this finding is not supported by presence of plaques.   02/26/2013 Patient here for routine followup. States he's doing well since last visit and has recently been changed from Coumadin to Xarelto  by cardiologist Virl Axe. Recent cholesterol test elevated LDL 110, total 210, PCP increased Pravachol to 39m. He c/o leg muscle/joint aches, and PCP is having him hold statin x 1 month to see if it makes a difference. It has been 2 weeks with no change. Patient states his  BP's usually run 1086-761'Psystolic, is 1509/32in office today. Patient is not diabetic. Patient denies medication side effects, with no signs of bleeding. No new neurological symptoms.  .Marland Kitchen REVIEW OF SYSTEMS: Out of a complete 14 system review of symptoms, the patient complains only of the following symptoms, and all other reviewed systems are negative.  Palpitations, joint pain, numbness, moles, itching  ALLERGIES: Allergies  Allergen Reactions  . Lisinopril Rash  . Simvastatin Rash    HOME MEDICATIONS: Outpatient Prescriptions Prior to Visit  Medication Sig Dispense Refill  . atorvastatin (LIPITOR) 20 MG tablet TAKE 1 TABLET BY MOUTH ONCE DAILY AT 6PM 90 tablet 1  . COD LIVER OIL PO Take 1 capsule by mouth daily.     . Coenzyme Q10 (CO Q-10) 200 MG CAPS Take 200 mg by mouth daily.     .Marland Kitchengabapentin (NEURONTIN) 400 MG capsule TAKE ONE CAPSULE BY MOUTH 3 TIMES A DAY 90 capsule 3  . losartan (COZAAR) 25 MG tablet TAKE 1 TABLET BY MOUTH EVERY DAY 30 tablet 0  . methocarbamol (ROBAXIN) 500 MG tablet Take 1 tablet (500 mg total) by mouth 2 (two) times daily as needed for muscle spasms. 40 tablet 1  . metoprolol succinate (TOPROL-XL) 25 MG 24 hr tablet TAKE 1 TABLET BY MOUTH EVERY DAY 90 tablet 2  . spironolactone (ALDACTONE) 25 MG tablet TAKE 1/2 TABLET BY MOUTH DAILY 15 tablet 0  . warfarin (COUMADIN) 5 MG tablet TAKE AS DIRECTED BY ANTICOAGULATION CLINIC 40 tablet 0  . losartan (COZAAR) 25 MG tablet Take 25 mg by mouth daily.    .Marland Kitchenlosartan (COZAAR) 25 MG tablet TAKE 1 TABLET BY MOUTH EVERY DAY 30 tablet 1  . spironolactone (ALDACTONE) 25 MG tablet TAKE 1/2 TABLET BY MOUTH DAILY 15 tablet 1  . warfarin (COUMADIN) 5 MG tablet TAKE AS DIRECTED BY ANTICOAGULATION CLINIC 40 tablet 0   No facility-administered medications prior to visit.    PAST MEDICAL HISTORY: Past Medical History  Diagnosis Date  . [place]olic [place] ([place]   . Persistent atrial fibrillation ([place] 08/24/2010  . NICM  (nonischemic cardiomyopathy) ([place]   . Bradycardia     nocturnal  . Ventricular tachycardia, non-sustained ([place]   . Mitral regurgitation   . Varicella   . Mumps   . Measles ([place])   . Abnormal transaminases   . Acid reflux   . Colon polyps 02/07/2012    Dr. CSilvano Rusk . HTN (hypertension) 06/02/2014  . Urinary hesitancy 12/21/2014  . Low back pain 06/15/2015  . Preventative health care 12/04/2013    Colonsocopy May '13 - adenomatous polyps  Immunizations Tdap Jan '14   . Neuropathy ([place] 01/10/2016  . Stroke ([facility] 08/24/2010    PAST SURGICAL HISTORY: Past Surgical History  Procedure Laterality Date  . Cardiac catheterization    . Colonoscopy w/ biopsies  02/07/2012    Dr. CSilvano Rusk . Cardioversion  08/15/2012    Procedure: CARDIOVERSION;  Surgeon: SDeboraha Sprang MD;  [facility];  Service: Cardiovascular;  Laterality: N/A;    FAMILY HISTORY: Family History  Problem Relation Age of Onset  . Alzheimer's disease Mother   . Lung cancer Brother  x 2, smokers  . Hypertension Brother   . Heart disease Brother   . Cancer Brother   . Lung cancer Brother   . Hypertension Brother   . Colon polyps Brother   . Hypertension Brother   . Hypertension Sister   . Neuropathy Sister   . Arthritis Sister   . Heart disease Father     CAD/MI  . Hypertension Father   . Mental illness Father     h/o depression requiring hospitalization  . Hyperlipidemia Sister     x 2  . Hypertension Sister   . Arthritis Sister   . Obesity Sister   . Birth defects Paternal Aunt     SOCIAL HISTORY: Social History   Social History  . Marital Status: Married    Spouse Name: Charlett Nose  . Number of Children: 2  . Years of Education: 16   Occupational History  . Nature conservation officer   .     Social History Main Topics  . Smoking status: Never Smoker   . Smokeless tobacco: Never Used  . Alcohol Use: 0.0 oz/week    0 Standard drinks or equivalent per week     Comment:  occasional wine  . Drug Use: No  . Sexual Activity:    Partners: Female     Comment: lives with wife, no dietary restrictions, except avoid dairy, uses [employer], works with [employer]   Other Topics Concern  . Not on file   Social History Narrative   Patient is married with 2 children. 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   Patient has 16 years of education.   Patient drinks 1-2 cups daily.      PHYSICAL EXAM  Filed Vitals:   04/05/16 0754  BP: 140/89  Pulse: 64  Height: 6' 3" (1.905 m)  Weight: 218 lb 9.6 oz (99.156 kg)   Body mass index is 27.32 kg/(m^2).  Generalized: Well developed, in no acute distress   Neurological examination  Mentation: Alert oriented to time, place, history taking. Follows all commands speech and language fluent Cranial nerve II-XII: Pupils were equal round reactive to light. Extraocular movements were full, visual field were full on confrontational test. Facial sensation and strength were normal. Uvula tongue midline. Head turning and shoulder shrug  were normal and symmetric. Motor: The motor testing reveals 5 over 5 strength of all 4 extremities. Good symmetric motor tone is noted throughout.  Sensory: Sensory testing is intact to soft touch on all 4 extremities. No evidence of extinction is noted.  Coordination: Cerebellar testing reveals good finger-nose-finger and heel-to-shin bilaterally.  Gait and station: Gait is normal. Tandem gait is normal. Romberg is negative. No drift is seen.  Reflexes: Deep tendon reflexes are symmetric and normal bilaterally.   DIAGNOSTIC DATA (LABS, IMAGING, TESTING) - I reviewed patient records, labs, notes, testing and imaging myself where available.  Lab Results  Component Value Date   WBC 4.5 06/15/2015   HGB 14.0 06/15/2015   HCT 41.3 06/15/2015   MCV 93.0 06/15/2015   PLT 176.0 06/15/2015       Component Value Date/Time   NA 137 12/23/2015 1013   K 4.6 12/23/2015 1013   CL 103 12/23/2015 1013   CO2 28 12/23/2015 1013   GLUCOSE 92 12/23/2015 1013   BUN 22 12/23/2015 1013   CREATININE 1.01 12/23/2015 1013  CALCIUM 10.0 12/23/2015 1013   PROT 7.9 12/23/2015 1013   PROT 7.5 12/01/2014 0926   ALBUMIN 4.3 12/23/2015 1013   AST 27 12/23/2015 1013   ALT 23 12/23/2015 1013   ALKPHOS 62 12/23/2015 1013   BILITOT 1.2 12/23/2015 1013   GFRNONAA >60 10/28/2010 1047   GFRAA  10/28/2010 1047    >60        The eGFR has been calculated using the MDRD equation. This calculation has not been validated in all clinical situations. eGFR's persistently <60 mL/min signify possible Chronic Kidney Disease.   Lab Results  Component Value Date   CHOL 151 12/23/2015   HDL 58.50 12/23/2015   LDLCALC 79 12/23/2015   LDLDIRECT 110.4 10/03/2012   TRIG 68.0 12/23/2015   CHOLHDL 3 12/23/2015         ASSESSMENT AND PLAN 60 y.o. year old male  has a past medical history of Systolic CHF ([place]); Persistent atrial fibrillation ([place]) (08/24/2010); NICM (nonischemic cardiomyopathy) ([place]); Bradycardia; Ventricular tachycardia, non-sustained ([place]); Mitral regurgitation; Varicella; Mumps; Measles ([place]); Abnormal transaminases; Acid reflux; Colon polyps (02/07/2012); HTN (hypertension) (06/02/2014); Urinary hesitancy (12/21/2014); Low back pain (06/15/2015); Preventative health care (12/04/2013); Neuropathy ([place]) (01/10/2016); and Stroke ([place]) (08/24/2010). here with:  1. History of stroke 2. Sensory neuropathy  Overall the patient is doing well. He continues to have some burning sensations in the lower extremities. We will increase gabapentin to 600 mg 3 times a day. Patient will continue on Coumadin. He should continue monitoring his blood pressure with goal less than 130/90 and cholesterol LDL less than 70. Patient advised that if his symptoms worsen or he develops any new symptoms he should let us  know. He will follow-up in 6 months or sooner if needed.     Ward Givens, MSN, NP-C 04/05/2016, 8:04 AM [facility] [address], [place], [place] [address] [phone]

## 2016-04-13 ENCOUNTER — Ambulatory Visit (INDEPENDENT_AMBULATORY_CARE_PROVIDER_SITE_OTHER): Payer: Managed Care, Other (non HMO) | Admitting: General Practice

## 2016-04-13 DIAGNOSIS — I639 Cerebral infarction, unspecified: Secondary | ICD-10-CM

## 2016-04-13 DIAGNOSIS — Z7901 Long term (current) use of anticoagulants: Secondary | ICD-10-CM

## 2016-04-13 DIAGNOSIS — I4891 Unspecified atrial fibrillation: Secondary | ICD-10-CM

## 2016-04-13 LAB — POCT INR: INR: 3

## 2016-04-13 NOTE — Progress Notes (Signed)
I have reviewed and agree with the plan. 

## 2016-04-25 ENCOUNTER — Other Ambulatory Visit: Payer: Self-pay | Admitting: Family Medicine

## 2016-04-26 NOTE — Progress Notes (Signed)
Electrophysiology Office Note Date: 04/27/2016  ID:  Tyler Sims, DOB [birth year], MRN [id number]  PCP: Penni Homans, MD Electrophysiologist: Caryl Comes  CC: AF and cardiomyopathy follow up  Tyler Sims is a 60 y.o. male seen today for Dr Caryl Comes.  He presents today for routine electrophysiology followup.  Since last being seen in our clinic, the patient reports doing relatively well.  His main concern is with peripheral neuropathy for which he is taking Neurontin.  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 He has some palpitations but burden is low. He is unable to identify triggers.  He denies palpitations, dyspnea, PND, orthopnea, nausea, vomiting, dizziness, syncope, edema, weight gain, or early satiety.  Past Medical History:  Diagnosis Date  . Abnormal transaminases   . Acid reflux   . Bradycardia    nocturnal  . Colon polyps 02/07/2012   Dr. Silvano Rusk  . HTN (hypertension) 06/02/2014  . Low back pain 06/15/2015  . Measles ([place])   . Mitral regurgitation   . Mumps   . Neuropathy ([place]) 01/10/2016  . NICM (nonischemic cardiomyopathy) ([place])   . Persistent atrial fibrillation ([place]) 08/24/2010  . Preventative health care 12/04/2013   Colonsocopy May '13 - adenomatous polyps  Immunizations Tdap Jan '14   . Stroke ([place]) 08/24/2010  . Systolic CHF ([place])   . Urinary hesitancy 12/21/2014  . Varicella   . Ventricular tachycardia, non-sustained [facility])    Past Surgical History:  Procedure Laterality Date  . CARDIAC CATHETERIZATION    . CARDIOVERSION  08/15/2012   Procedure: CARDIOVERSION;  Surgeon: Deboraha Sprang, MD;  Location: [place];  Service: Cardiovascular;  Laterality: N/A;  . COLONOSCOPY W/ BIOPSIES  02/07/2012   Dr. Silvano Rusk    Current Outpatient Prescriptions  Medication Sig Dispense Refill  . atorvastatin (LIPITOR) 20 MG tablet TAKE 1 TABLET BY MOUTH ONCE DAILY AT 6PM 90 tablet 1  . COD LIVER  OIL PO Take 1 capsule by mouth daily.     . Coenzyme Q10 (CO Q-10) 200 MG CAPS Take 200 mg by mouth daily.     Marland Kitchen gabapentin (NEURONTIN) 600 MG tablet Take 1 tablet (600 mg total) by mouth 3 (three) times daily. 90 tablet 5  . losartan (COZAAR) 25 MG tablet TAKE 1 TABLET BY MOUTH EVERY DAY 30 tablet 0  . methocarbamol (ROBAXIN) 500 MG tablet Take 1 tablet (500 mg total) by mouth 2 (two) times daily as needed for muscle spasms. 40 tablet 1  . metoprolol succinate (TOPROL-XL) 25 MG 24 hr tablet TAKE 1 TABLET BY MOUTH EVERY DAY 90 tablet 2  . spironolactone (ALDACTONE) 25 MG tablet TAKE 1/2 TABLET BY MOUTH DAILY 15 tablet 0  . warfarin (COUMADIN) 5 MG tablet TAKE AS DIRECTED BY ANTICOAGULATION CLINIC 40 tablet 0   No current facility-administered medications for this visit.     Allergies:   Lisinopril and Simvastatin   Social History: Social History   Social History  . Marital status: Married    Spouse name: Charlett Nose  . Number of children: 2  . Years of education: 16   Occupational History  . Nature conservation officer   .  [employer]   Social History Main Topics  . Smoking status: Never Smoker  . Smokeless tobacco: Never Used  . Alcohol use 0.0 oz/week     Comment: occasional wine  . Drug use: No  . Sexual activity: Yes  Partners: Female     Comment: lives with wife, no dietary restrictions, except avoid dairy, uses Lactaid, works with [employer]   Other Topics Concern  . Not on file   Social History Narrative   Patient is married with 2 children. 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   Patient has 16 years of education.   Patient drinks 1-2 cups daily.    Family History: Family History  Problem Relation Age of Onset  . Alzheimer's disease Mother   . Lung cancer Brother     x 2, smokers  . Hypertension Brother   . Heart disease Brother   . Cancer Brother     . Lung cancer Brother   . Hypertension Brother   . Colon polyps Brother   . Hypertension Brother   . Hypertension Sister   . Neuropathy Sister   . Arthritis Sister   . Heart disease Father     CAD/MI  . Hypertension Father   . Mental illness Father     h/o depression requiring hospitalization  . Hyperlipidemia Sister     x 2  . Hypertension Sister   . Arthritis Sister   . Obesity Sister   . Birth defects Paternal Aunt     Review of Systems: All other systems reviewed and are otherwise negative except as noted above.   Physical Exam: VS:  BP (!) 134/92 (BP Location: Left Arm, Patient Position: Sitting, Cuff Size: Normal)   Pulse (!) 57   Ht 6\' 3"  (1.905 m)   Wt 218 lb 9.6 oz (99.2 kg)   BMI 27.32 kg/m  , BMI Body mass index is 27.32 kg/m. Wt Readings from Last 3 Encounters:  04/27/16 218 lb 9.6 oz (99.2 kg)  04/05/16 218 lb 9.6 oz (99.2 kg)  12/23/15 221 lb 9.6 oz (100.5 kg)    GEN- The patient is well appearing, alert and oriented x 3 today.   HEENT: normocephalic, atraumatic; sclera clear, conjunctiva pink; hearing intact; oropharynx clear; neck supple  Lungs- Clear to ausculation bilaterally, normal work of breathing.  No wheezes, rales, rhonchi Heart- Regular rate and rhythm  GI- soft, non-tender, non-distended, bowel sounds present  Extremities- no clubbing, cyanosis, or edema  MS- no significant deformity or atrophy Skin- warm and dry, no rash or lesion  Psych- euthymic mood, full affect Neuro- strength and sensation are intact   EKG:  EKG is ordered today. The ekg ordered today shows sinus bradycardia  Recent Labs: 06/15/2015: Hemoglobin 14.0; Platelets 176.0 12/23/2015: ALT 23; BUN 22; Creatinine, Ser 1.01; Potassium 4.6; Sodium 137; TSH 1.14    Other studies Reviewed: Additional studies/ records that were reviewed today include: Dr Olin Pia office notes  Assessment and Plan: 1.  Paroxysmal atrial fibrillation Burden by symptoms low Continue  Warfarin for CHADS2VASC of 4 CBC 12/2015 stable   2.  NICM Update echo Continue ARB/BB/spiro BMET 12/2015 stable   3.  HTN Stable No change required today  Current medicines are reviewed at length with the patient today.   The patient does not have concerns regarding his medicines.  The following changes were made today:  none  Labs/ tests ordered today include: none No orders of the defined types were placed in this encounter.    Disposition:   Follow up with Dr Caryl Comes 1 year    Signed, Chanetta Marshall, NP 04/27/2016  11:15 AM   [facility] [address] [place] [address] [phone] (office) [phone] (fax)

## 2016-04-27 ENCOUNTER — Ambulatory Visit (INDEPENDENT_AMBULATORY_CARE_PROVIDER_SITE_OTHER): Payer: Managed Care, Other (non HMO) | Admitting: Nurse Practitioner

## 2016-04-27 ENCOUNTER — Encounter: Payer: Self-pay | Admitting: Nurse Practitioner

## 2016-04-27 ENCOUNTER — Encounter (INDEPENDENT_AMBULATORY_CARE_PROVIDER_SITE_OTHER): Payer: Self-pay

## 2016-04-27 VITALS — BP 134/92 | HR 57 | Ht 75.0 in | Wt 218.6 lb

## 2016-04-27 DIAGNOSIS — I429 Cardiomyopathy, unspecified: Secondary | ICD-10-CM | POA: Diagnosis not present

## 2016-04-27 NOTE — Patient Instructions (Signed)
Medication Instructions:   Your physician recommends that you continue on your current medications as directed. Please refer to the Current Medication list given to you today.   If you need a refill on your cardiac medications before your next appointment, please call your pharmacy.  Labwork: NONE ORDER TODAY   Testing/Procedures: Your physician has requested that you have an echocardiogram. Echocardiography is a painless test that uses sound waves to create images of your heart. It provides your doctor with information about the size and shape of your heart and how well your heart's chambers and valves are working. This procedure takes approximately one hour. There are no restrictions for this procedure.   Follow-Up:  Your physician wants you to follow-up in: [place] will receive a reminder letter in the mail two months in advance. If you don't receive a letter, please call our office to schedule the follow-up appointment.     Any Other Special Instructions Will Be Listed Below (If Applicable).

## 2016-04-30 ENCOUNTER — Other Ambulatory Visit: Payer: Self-pay | Admitting: Family Medicine

## 2016-05-04 ENCOUNTER — Encounter (INDEPENDENT_AMBULATORY_CARE_PROVIDER_SITE_OTHER): Payer: Self-pay

## 2016-05-04 ENCOUNTER — Other Ambulatory Visit: Payer: Self-pay

## 2016-05-04 ENCOUNTER — Ambulatory Visit (HOSPITAL_COMMUNITY): Payer: Managed Care, Other (non HMO) | Attending: Cardiology

## 2016-05-04 DIAGNOSIS — I429 Cardiomyopathy, unspecified: Secondary | ICD-10-CM | POA: Diagnosis not present

## 2016-05-04 DIAGNOSIS — I340 Nonrheumatic mitral (valve) insufficiency: Secondary | ICD-10-CM | POA: Insufficient documentation

## 2016-05-04 DIAGNOSIS — I119 Hypertensive heart disease without heart failure: Secondary | ICD-10-CM | POA: Insufficient documentation

## 2016-05-04 DIAGNOSIS — I071 Rheumatic tricuspid insufficiency: Secondary | ICD-10-CM | POA: Diagnosis not present

## 2016-05-04 DIAGNOSIS — I428 Other cardiomyopathies: Secondary | ICD-10-CM | POA: Insufficient documentation

## 2016-05-12 ENCOUNTER — Telehealth: Payer: Self-pay | Admitting: *Deleted

## 2016-05-12 NOTE — Telephone Encounter (Signed)
-----   Message from Patsey Berthold, NP sent at 05/12/2016  9:56 AM EDT ----- Please notify patient of stable echo

## 2016-05-12 NOTE — Telephone Encounter (Signed)
SPOKE TO PT ABOUT RESULTS AND VERBALIZED UNDERSTANDING  

## 2016-05-17 ENCOUNTER — Other Ambulatory Visit: Payer: Self-pay | Admitting: Internal Medicine

## 2016-05-24 ENCOUNTER — Other Ambulatory Visit: Payer: Self-pay | Admitting: Family Medicine

## 2016-05-24 ENCOUNTER — Ambulatory Visit: Payer: Managed Care, Other (non HMO) | Admitting: Family Medicine

## 2016-05-25 ENCOUNTER — Ambulatory Visit (INDEPENDENT_AMBULATORY_CARE_PROVIDER_SITE_OTHER): Payer: Managed Care, Other (non HMO) | Admitting: General Practice

## 2016-05-25 ENCOUNTER — Telehealth: Payer: Self-pay | Admitting: *Deleted

## 2016-05-25 ENCOUNTER — Encounter: Payer: Self-pay | Admitting: Physician Assistant

## 2016-05-25 ENCOUNTER — Ambulatory Visit (INDEPENDENT_AMBULATORY_CARE_PROVIDER_SITE_OTHER): Payer: Managed Care, Other (non HMO) | Admitting: Physician Assistant

## 2016-05-25 VITALS — BP 118/76 | HR 60 | Temp 97.6°F | Resp 16 | Ht 75.0 in | Wt 222.4 lb

## 2016-05-25 DIAGNOSIS — I4891 Unspecified atrial fibrillation: Secondary | ICD-10-CM

## 2016-05-25 DIAGNOSIS — M25562 Pain in left knee: Secondary | ICD-10-CM | POA: Diagnosis not present

## 2016-05-25 DIAGNOSIS — Z23 Encounter for immunization: Secondary | ICD-10-CM | POA: Diagnosis not present

## 2016-05-25 DIAGNOSIS — Z7901 Long term (current) use of anticoagulants: Secondary | ICD-10-CM

## 2016-05-25 DIAGNOSIS — I639 Cerebral infarction, unspecified: Secondary | ICD-10-CM

## 2016-05-25 LAB — POCT INR: INR: 2.3

## 2016-05-25 MED ORDER — TRAMADOL HCL 50 MG PO TABS: 50.0000 mg | ORAL_TABLET | Freq: Three times a day (TID) | ORAL | 0 refills | Status: DC | PRN

## 2016-05-25 NOTE — Progress Notes (Signed)
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 Notes difficulty bending knee to kneel and pain when he stood back up. Has worsened over time with repetitive kneeling at work. Denies difficulty walking but notes pain with extension and flexion of the knee. No pain while resting or standing. Symptoms improved greatly this past week when he had a few days off. Has worsened since working several shifts at work.  Past Medical History:  Diagnosis Date  . Abnormal transaminases   . Acid reflux   . Bradycardia    nocturnal  . Colon polyps 02/07/2012   Dr. Silvano Rusk  . HTN (hypertension) 06/02/2014  . Low back pain 06/15/2015  . Measles ([place])   . Mitral regurgitation   . Mumps   . Neuropathy ([place]) 01/10/2016  . NICM (nonischemic cardiomyopathy) ([place])   . Persistent atrial fibrillation ([place]) 08/24/2010  . Preventative health care 12/04/2013   Colonsocopy May '13 - adenomatous polyps  Immunizations Tdap Jan '14   . Stroke ([place]) 08/24/2010  . Systolic CHF ([place])   . Urinary hesitancy 12/21/2014  . Varicella   . Ventricular tachycardia, non-sustained [facility])     Current Outpatient Prescriptions on File Prior to Visit  Medication Sig Dispense Refill  . atorvastatin (LIPITOR) 20 MG tablet TAKE 1 TABLET BY MOUTH ONCE DAILY AT 6PM 90 tablet 1  . COD LIVER OIL PO Take 1 capsule by mouth daily.     . Coenzyme Q10 (CO Q-10) 200 MG CAPS Take 200 mg by mouth daily.     Marland Kitchen gabapentin (NEURONTIN) 600 MG tablet Take 1 tablet (600 mg total) by mouth 3 (three) times daily. 90 tablet 5  . losartan (COZAAR) 25 MG tablet TAKE 1 TABLET BY MOUTH EVERY DAY 30 tablet 0  . methocarbamol (ROBAXIN) 500 MG tablet Take 1 tablet (500 mg total) by mouth 2 (two) times daily as needed for muscle spasms. 40 tablet 1  . metoprolol succinate (TOPROL-XL) 25 MG 24 hr tablet TAKE 1 TABLET BY MOUTH EVERY DAY 90 tablet 2  .  spironolactone (ALDACTONE) 25 MG tablet TAKE 1/2 TABLET BY MOUTH DAILY 15 tablet 11  . warfarin (COUMADIN) 5 MG tablet TAKE AS DIRECTED BY ANTICOAGULATION CLINIC 40 tablet 0   No current facility-administered medications on file prior to visit.     Allergies  Allergen Reactions  . Lisinopril Rash  . Simvastatin Rash    Family History  Problem Relation Age of Onset  . Alzheimer's disease Mother   . Lung cancer Brother     x 2, smokers  . Hypertension Brother   . Heart disease Brother   . Cancer Brother   . Lung cancer Brother   . Hypertension Brother   . Colon polyps Brother   . Hypertension Brother   . Hypertension Sister   . Neuropathy Sister   . Arthritis Sister   . Heart disease Father     CAD/MI  . Hypertension Father   . Mental illness Father     h/o depression requiring hospitalization  . Hyperlipidemia Sister     x 2  . Hypertension Sister   . Arthritis Sister   . Obesity Sister   . Birth defects Paternal Aunt     Social History   Social History  . Marital status: Married    Spouse name: Charlett Nose  . Number of children: 2  . Years  of education: 16   Occupational History  . Nature conservation officer   .  [employer]   Social History Main Topics  . Smoking status: Never Smoker  . Smokeless tobacco: Never Used  . Alcohol use 0.0 oz/week     Comment: occasional wine  . Drug use: No  . Sexual activity: Yes    Partners: Female     Comment: lives with wife, no dietary restrictions, except avoid dairy, uses Lactaid, works with [employer]   Other Topics Concern  . None   Social History Narrative   Patient is married with 2 children. 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   Patient has 16 years of education.   Patient drinks 1-2 cups daily.   Review of Systems - See HPI.  All other ROS are negative.  BP 118/76 (BP Location: Right Arm, Patient  Position: Sitting, Cuff Size: Large)   Pulse 60   Temp 97.6 F (36.4 C) (Oral)   Resp 16   Ht 6\' 3"  (1.905 m)   Wt 222 lb 6 oz (100.9 kg)   SpO2 98%   BMI 27.79 kg/m   Physical Exam  Constitutional: He is oriented to person, place, and time and well-developed, well-nourished, and in no distress.  HENT:  Head: Normocephalic and atraumatic.  Cardiovascular: Normal rate, regular rhythm, normal heart sounds and intact distal pulses.   Pulmonary/Chest: Effort normal and breath sounds normal. No respiratory distress. He has no wheezes. He has no rales. He exhibits no tenderness.  Musculoskeletal:       Left knee: He exhibits normal range of motion and no swelling. Tenderness found. Medial joint line tenderness noted.  Neurological: He is alert and oriented to person, place, and time.  Skin: Skin is warm and dry. No rash noted.  Psychiatric: Affect normal.  Vitals reviewed.  Recent Results (from the past 2160 hour(s))  POCT INR     Status: None   Collection Time: 03/02/16 12:00 AM  Result Value Ref Range   INR 2.5   POCT INR     Status: None   Collection Time: 04/13/16 12:00 AM  Result Value Ref Range   INR 3.0   POCT INR     Status: None   Collection Time: 05/25/16 12:00 AM  Result Value Ref Range   INR 2.3    Assessment/Plan: 1. Left knee pain MSK. Supportive measures reviewed including RICE. Rx Tramadol. Topical creams only due to coumadin use. FU if not resolving.   Leeanne Rio, PA-C

## 2016-05-25 NOTE — Progress Notes (Signed)
I have reviewed and agree with the plan. 

## 2016-05-25 NOTE — Telephone Encounter (Signed)
[facility] note received via fax  Call:   Date: 05/21/16 Time: 1241   Caller: Self Return number: [phone]  Nurse: Georga Bora, RN  Chief Complaint: Knee Injury  Reason for call: Caller state two weeks ago he was bending down at work and his left knee seemed like it couldn't bend. Since then it has been swollen and hurting. Can't put a lot of weight on it.   Related visit to physician within the last 2 weeks: N/A  Guideline: Knee Injury; knee giving way (or buckling) when walking  Disposition: See PCP When Office is Open (within 3 days)  **Pt has appt scheduled 05/25/16**

## 2016-05-25 NOTE — Patient Instructions (Signed)
Please wear your brace daily. Avoid kneeling. Take the pain medication as directed. Continue topical anti-inflammatories to the knee.  Call if symptoms are not resolving.

## 2016-05-25 NOTE — Progress Notes (Signed)
Injection given.   Tyler Sims Tyler Carreno, MD  

## 2016-05-29 ENCOUNTER — Other Ambulatory Visit: Payer: Self-pay | Admitting: Internal Medicine

## 2016-05-31 ENCOUNTER — Other Ambulatory Visit: Payer: Self-pay | Admitting: Family Medicine

## 2016-06-01 ENCOUNTER — Ambulatory Visit (HOSPITAL_BASED_OUTPATIENT_CLINIC_OR_DEPARTMENT_OTHER)
Admission: RE | Admit: 2016-06-01 | Discharge: 2016-06-01 | Disposition: A | Discharge status: Home / Self Care | Payer: Managed Care, Other (non HMO) | Source: Ambulatory Visit | Attending: Physician Assistant | Admitting: Physician Assistant

## 2016-06-01 ENCOUNTER — Ambulatory Visit (INDEPENDENT_AMBULATORY_CARE_PROVIDER_SITE_OTHER): Payer: Managed Care, Other (non HMO) | Admitting: Physician Assistant

## 2016-06-01 ENCOUNTER — Encounter (HOSPITAL_BASED_OUTPATIENT_CLINIC_OR_DEPARTMENT_OTHER): Payer: Self-pay

## 2016-06-01 ENCOUNTER — Encounter: Payer: Self-pay | Admitting: Physician Assistant

## 2016-06-01 VITALS — BP 122/78 | HR 62 | Temp 98.0°F | Resp 16 | Ht 75.0 in | Wt 218.0 lb

## 2016-06-01 DIAGNOSIS — M25562 Pain in left knee: Secondary | ICD-10-CM | POA: Diagnosis not present

## 2016-06-01 DIAGNOSIS — M25561 Pain in right knee: Secondary | ICD-10-CM

## 2016-06-01 NOTE — Patient Instructions (Signed)
Please go downstairs for imaging. I will call with your results.  Keep leg elevated while resting. You can continue alternating ice and heat to the area. Continue topical Icy Hot. Tylenol if needed for pain.  Exam looks good. I do not see any pitting edema. Your pulses are good and your extremity is warm. No concern of clot. Feel swelling secondary to prolonged standing. You do need to move around to help reduce swelling as the leg muscles help to pump blood/fluid back to heart. If recurring, please let me know and we will obtain an [place] to assess for venous insufficiency

## 2016-06-01 NOTE — Progress Notes (Signed)
Patient presents to clinic today for follow-up of anterior knee pain. Patient endorses following care discussed at last visit with marked improvement in pain.  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 Notes some mild pain in knee due to increased ambulation yesterday. Did not some mild swelling of ankles bilaterally, worsening by end of the day and improved on waking today. Denies injury to feet or ankles. Denies other new symptoms.  Would like to discuss imaging due to continued knee pain.  Past Medical History:  Diagnosis Date  . Abnormal transaminases   . Acid reflux   . Bradycardia    nocturnal  . Colon polyps 02/07/2012   Dr. Silvano Rusk  . HTN (hypertension) 06/02/2014  . Low back pain 06/15/2015  . Measles ([place])   . Mitral regurgitation   . Mumps   . Neuropathy ([place]) 01/10/2016  . NICM (nonischemic cardiomyopathy) ([place])   . Persistent atrial fibrillation ([place]) 08/24/2010  . Preventative health care 12/04/2013   Colonsocopy May '13 - adenomatous polyps  Immunizations Tdap Jan '14   . Stroke ([place]) 08/24/2010  . Systolic CHF ([place])   . Urinary hesitancy 12/21/2014  . Varicella   . Ventricular tachycardia, non-sustained [facility])     Current Outpatient Prescriptions on File Prior to Visit  Medication Sig Dispense Refill  . atorvastatin (LIPITOR) 20 MG tablet TAKE 1 TABLET BY MOUTH ONCE DAILY AT 6PM 90 tablet 0  . COD LIVER OIL PO Take 1 capsule by mouth daily.     . Coenzyme Q10 (CO Q-10) 200 MG CAPS Take 200 mg by mouth daily.     Marland Kitchen gabapentin (NEURONTIN) 600 MG tablet Take 1 tablet (600 mg total) by mouth 3 (three) times daily. 90 tablet 5  . losartan (COZAAR) 25 MG tablet TAKE 1 TABLET BY MOUTH EVERY DAY 30 tablet 11  . methocarbamol (ROBAXIN) 500 MG tablet Take 1 tablet (500 mg total) by mouth 2 (two) times daily as needed for muscle spasms. 40 tablet 1  . metoprolol succinate (TOPROL-XL) 25 MG 24 hr tablet TAKE 1 TABLET  BY MOUTH EVERY DAY 90 tablet 2  . spironolactone (ALDACTONE) 25 MG tablet TAKE 1/2 TABLET BY MOUTH DAILY 15 tablet 11  . traMADol (ULTRAM) 50 MG tablet Take 1 tablet (50 mg total) by mouth every 8 (eight) hours as needed. 15 tablet 0  . warfarin (COUMADIN) 5 MG tablet TAKE AS DIRECTED BY ANTICOAGULATION CLINIC 40 tablet 0   No current facility-administered medications on file prior to visit.     Allergies  Allergen Reactions  . Lisinopril Rash  . Simvastatin Rash    Family History  Problem Relation Age of Onset  . Alzheimer's disease Mother   . Lung cancer Brother     x 2, smokers  . Hypertension Brother   . Heart disease Brother   . Cancer Brother   . Lung cancer Brother   . Hypertension Brother   . Colon polyps Brother   . Hypertension Brother   . Hypertension Sister   . Neuropathy Sister   . Arthritis Sister   . Heart disease Father     CAD/MI  . Hypertension Father   . Mental illness Father     h/o depression requiring hospitalization  . Hyperlipidemia Sister     x 2  . Hypertension Sister   . Arthritis Sister   . Obesity Sister   . Birth defects Paternal Aunt  Social History   Social History  . Marital status: Married    Spouse name: Charlett Nose  . Number of children: 2  . Years of education: 16   Occupational History  . Nature conservation officer   .  [employer]   Social History Main Topics  . Smoking status: Never Smoker  . Smokeless tobacco: Never Used  . Alcohol use 0.0 oz/week     Comment: occasional wine  . Drug use: No  . Sexual activity: Yes    Partners: Female     Comment: lives with wife, no dietary restrictions, except avoid dairy, uses Lactaid, works with [employer]   Other Topics Concern  . None   Social History Narrative   Patient is married with 2 children. 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   Patient has 16 years of  education.   Patient drinks 1-2 cups daily.   Review of Systems - See HPI.  All other ROS are negative.  BP 122/78 (BP Location: Right Arm, Patient Position: Sitting, Cuff Size: Large)   Pulse 62   Temp 98 F (36.7 C) (Oral)   Resp 16   Ht 6\' 3"  (1.905 m)   Wt 218 lb (98.9 kg)   SpO2 98%   BMI 27.25 kg/m   Physical Exam  Constitutional: He is oriented to person, place, and time and well-developed, well-nourished, and in no distress.  HENT:  Head: Normocephalic and atraumatic.  Eyes: Conjunctivae are normal.  Neck: Neck supple.  Cardiovascular: Normal rate, regular rhythm and normal heart sounds.   Pulses:      Popliteal pulses are 2+ on the right side, and 2+ on the left side.       Dorsalis pedis pulses are 2+ on the right side, and 2+ on the left side.       Posterior tibial pulses are 2+ on the right side, and 2+ on the left side.  Trace non-pitting edema noted of ankles bilaterally. No edema noted of lower legs bilaterally. No evidence skin changes suggestive of chronic venous stasis. Extremities without cyanosis or abnormal temperature. Negative Homan sign of L leg.  Pulmonary/Chest: Effort normal and breath sounds normal. No respiratory distress. He has no wheezes. He has no rales. He exhibits no tenderness.  Neurological: He is alert and oriented to person, place, and time.  Skin: Skin is warm and dry. No rash noted.  Psychiatric: Affect normal.  Vitals reviewed.   Recent Results (from the past 2160 hour(s))  POCT INR     Status: None   Collection Time: 04/13/16 12:00 AM  Result Value Ref Range   INR 3.0   POCT INR     Status: None   Collection Time: 05/25/16 12:00 AM  Result Value Ref Range   INR 2.3    Assessment/Plan: 1. Medial joint line tenderness of left knee Atraumatic and improving. Discussed again most likely strain in tendons of connecting muscles. Do not feel x-ray needed at present since symptoms are improving. Patient still requesting imaging. Will  check x-ray today. Discussed continued supportive measures. Discussed mild ankle swelling is most likely due to prolonged standing. Discussed things to do on the job to help with this. No calf swelling or tenderness noted. Negative Homan sign. Wells Score of 0. He is to follow-up if this recurs for further assessment of venous  valvular insufficiency. He is to continue measures discussed at last visit. OTC pain medication reviewed.  - DG Knee Complete 4 Views Left; Future   Leeanne Rio, PA-C

## 2016-06-08 ENCOUNTER — Encounter: Payer: Self-pay | Admitting: Family Medicine

## 2016-06-08 ENCOUNTER — Telehealth: Payer: Self-pay | Admitting: Family Medicine

## 2016-06-08 DIAGNOSIS — M25562 Pain in left knee: Secondary | ICD-10-CM

## 2016-06-08 NOTE — Telephone Encounter (Signed)
Pt called in because he says that he was seen for his knee injury by Elyn Aquas.  Pt says that he would like a referral to Dr. Hulan Saas (sports medicine). Pt would like to be reached at:    [phone]

## 2016-06-08 NOTE — Telephone Encounter (Signed)
LMOM with contact name and number [for return call, if needed] RE: referral placed per patient request and provider instructions/SLS 09/20

## 2016-06-08 NOTE — Telephone Encounter (Signed)
Ok to place the referral for the patient to Dr. Tamala Julian at [place].

## 2016-06-20 ENCOUNTER — Other Ambulatory Visit: Payer: Self-pay | Admitting: Family Medicine

## 2016-06-21 NOTE — Progress Notes (Signed)
[name] [place], [place] [address] Phone: [phone] Subjective:    I'm seeing this patient by the request  of: Penni Homans, MD  Elyn Aquas PA    CC: Left knee pain  [id number]  Tyler Sims is a 60 y.o. male coming in with complaint of left knee pain. Patient states that this pain started in 4-6 weeks ago. Patient states that it started hurting when he was kneeling at work. Worse bending the knee and very painful. Seems that repetitive activity seems to make it worse. No pain with resting or standing in one position. Seems a worse with working but seemed to improve on days off. Patient states that there was swelling initially but the swelling has resolved. States that over the course last 5-7 days and has-been improving. Mild discomfort still on the medial aspect of the knee but denies any instability of the knee.   X-rays were independently visualized by me. X-rays taken 06/01/2016 were unremarkable for any bony normality. Past Medical History:  Diagnosis Date  . Abnormal transaminases   . Acid reflux   . Bradycardia    nocturnal  . Colon polyps 02/07/2012   Dr. Silvano Rusk  . HTN (hypertension) 06/02/2014  . Low back pain 06/15/2015  . Measles ([place])   . Mitral regurgitation   . Mumps   . Neuropathy ([place]) 01/10/2016  . NICM (nonischemic cardiomyopathy) ([place])   . Persistent atrial fibrillation ([place]) 08/24/2010  . Preventative health care 12/04/2013   Colonsocopy May '13 - adenomatous polyps  Immunizations Tdap Jan '14   . Stroke ([place]) 08/24/2010  . Systolic CHF ([place])   . Urinary hesitancy 12/21/2014  . Varicella   . Ventricular tachycardia, non-sustained [facility])    Past Surgical History:  Procedure Laterality Date  . CARDIAC CATHETERIZATION    . CARDIOVERSION  08/15/2012   Procedure: CARDIOVERSION;  Surgeon: Deboraha Sprang, MD;  Location: [facility];  Service: Cardiovascular;  Laterality: N/A;  . COLONOSCOPY W/  BIOPSIES  02/07/2012   Dr. Silvano Rusk   Social History   Social History  . Marital status: Married    Spouse name: Charlett Nose  . Number of children: 2  . Years of education: 16   Occupational History  . Nature conservation officer   .  [employer]   Social History Main Topics  . Smoking status: Never Smoker  . Smokeless tobacco: Never Used  . Alcohol use 0.0 oz/week     Comment: occasional wine  . Drug use: No  . Sexual activity: Yes    Partners: Female     Comment: lives with wife, no dietary restrictions, except avoid dairy, uses Lactaid, works with [employer]   Other Topics Concern  . Not on file   Social History Narrative   Patient is married with 2 children. Store Scientist, clinical (histocompatibility and immunogenetics) [employer]   1 daughter attorney in [place]   Other daughter [employer] grad - pursuing PhD in [place]   Patient is right handed.   Patient has 16 years of education.   Patient drinks 1-2 cups daily.   Allergies  Allergen Reactions  . Lisinopril Rash  . Simvastatin Rash   Family History  Problem Relation Age of Onset  . Alzheimer's disease Mother   . Lung cancer Brother     x 2, smokers  . Hypertension Brother   . Heart disease Brother   . Cancer Brother   . Lung cancer Brother   .  Hypertension Brother   . Colon polyps Brother   . Hypertension Brother   . Hypertension Sister   . Neuropathy Sister   . Arthritis Sister   . Heart disease Father     CAD/MI  . Hypertension Father   . Mental illness Father     h/o depression requiring hospitalization  . Hyperlipidemia Sister     x 2  . Hypertension Sister   . Arthritis Sister   . Obesity Sister   . Birth defects Paternal Aunt     Past medical history, social, surgical and family history all reviewed in electronic medical record.  No pertanent information unless stated regarding to the chief complaint.   Review of Systems: No headache, visual changes, nausea, vomiting, diarrhea, constipation, dizziness, abdominal pain, skin rash,  fevers, chills, night sweats, weight loss, swollen lymph nodes, body aches, joint swelling, muscle aches, chest pain, shortness of breath, mood changes.   Objective  Blood pressure (!) 144/82, pulse (!) 52, weight 217 lb 6.4 oz (98.6 kg).  General: No apparent distress alert and oriented x3 mood and affect normal, dressed appropriately.  HEENT: Pupils equal, extraocular movements intact  Respiratory: Patient's speak in full sentences and does not appear short of breath  Cardiovascular: No lower extremity edema, non tender, no erythema  Skin: Warm dry intact with no signs of infection or rash on extremities or on axial skeleton.  Abdomen: Soft nontender  Neuro: Cranial nerves II through XII are intact, neurovascularly intact in all extremities with 2+ DTRs and 2+ pulses.  Lymph: No lymphadenopathy of posterior or anterior cervical chain or axillae bilaterally.  Gait normal with good balance and coordination.  MSK:  Non tender with full range of motion and good stability and symmetric strength and tone of shoulders, elbows, wrist, hip and ankles bilaterally.  Knee: Left Normal to inspection with no erythema or effusion or obvious bony abnormalities. Tender to palpation over the medial joint line ROM full in flexion and extension and lower leg rotation. Ligaments with solid consistent endpoints including ACL, PCL, LCL, MCL. Positive Mcmurray's, Apley's, and Thessalonian tests. Non painful patellar compression. Patellar glide with minimal crepitus. Patellar and quadriceps tendons unremarkable. Hamstring and quadriceps strength is normal.  Contralateral knee unremarkable  MSK US performed of: Left This study was ordered, performed, and interpreted by Charlann Boxer D.O.  Knee: All structures visualized. Posterior medial meniscus does have a large tear with minimal displacement. Hypoechoic changes and increasing upper flow noted. Mild to moderate osteophytic changes of the medial and  patellofemoral joint  IMPRESSION: Acute medial meniscal tear with mild displacement and mild underlying arthritic changes.  Procedure note [id number]; 15 minutes spent for Therapeutic exercises as stated in above notes.  This included exercises focusing on stretching, strengthening, with significant focus on eccentric aspects.   Flexion and extension exercises working on the vastus medialis oblique and hip abductor strengthening given Proper technique shown and discussed handout in great detail with ATC.  All questions were discussed and answered.      Impression and Recommendations:     This case required medical decision making of moderate complexity.      Note: This dictation was prepared with Dragon dictation along with smaller phrase technology. Any transcriptional errors that result from this process are unintentional.

## 2016-06-22 ENCOUNTER — Encounter: Payer: Self-pay | Admitting: Family Medicine

## 2016-06-22 ENCOUNTER — Other Ambulatory Visit: Payer: Self-pay

## 2016-06-22 ENCOUNTER — Ambulatory Visit (INDEPENDENT_AMBULATORY_CARE_PROVIDER_SITE_OTHER): Payer: Managed Care, Other (non HMO) | Admitting: Family Medicine

## 2016-06-22 VITALS — BP 144/82 | HR 52 | Wt 217.4 lb

## 2016-06-22 DIAGNOSIS — M25562 Pain in left knee: Secondary | ICD-10-CM | POA: Diagnosis not present

## 2016-06-22 DIAGNOSIS — S83242A Other tear of medial meniscus, current injury, left knee, initial encounter: Secondary | ICD-10-CM | POA: Insufficient documentation

## 2016-06-22 MED ORDER — DICLOFENAC SODIUM 2 % TD SOLN: 2.0000 "application " | Freq: Two times a day (BID) | TRANSDERMAL | 3 refills | Status: DC

## 2016-06-22 NOTE — Patient Instructions (Signed)
Good to see you.  Ice 20 minutes 2 times daily. Usually after activity and before bed. Exercises 3 times a week.  pennsaid pinkie amount topically 2 times daily as needed.  Avoid twisting exercises for now.  Vitamin D 2000 IU daily  See me again in 4 weeks if not completely better.

## 2016-06-22 NOTE — Assessment & Plan Note (Signed)
Injury occurred 6 weeks ago. We discussed different treatment options. We discussed icing regimen and home exercises. We discussed which activities doing which was to avoid. Patient work with Product/process development scientist given home exercises in greater detail. Topical anti-inflammatories given to avoid oral anti-inflammatories with patient being on a blood thinner. Follow-up again in 4 weeks for further evaluation. Worsening symptoms consider injection and formal physical therapy.

## 2016-06-30 ENCOUNTER — Encounter: Payer: Self-pay | Admitting: Family Medicine

## 2016-06-30 ENCOUNTER — Ambulatory Visit (INDEPENDENT_AMBULATORY_CARE_PROVIDER_SITE_OTHER): Payer: Managed Care, Other (non HMO) | Admitting: Family Medicine

## 2016-06-30 VITALS — BP 130/76 | HR 52 | Temp 98.6°F | Ht 75.0 in | Wt 217.5 lb

## 2016-06-30 DIAGNOSIS — I10 Essential (primary) hypertension: Secondary | ICD-10-CM | POA: Diagnosis not present

## 2016-06-30 DIAGNOSIS — E559 Vitamin D deficiency, unspecified: Secondary | ICD-10-CM | POA: Diagnosis not present

## 2016-06-30 DIAGNOSIS — S83242A Other tear of medial meniscus, current injury, left knee, initial encounter: Secondary | ICD-10-CM

## 2016-06-30 DIAGNOSIS — Z8673 Personal history of transient ischemic attack (TIA), and cerebral infarction without residual deficits: Secondary | ICD-10-CM

## 2016-06-30 DIAGNOSIS — M545 Low back pain: Secondary | ICD-10-CM

## 2016-06-30 DIAGNOSIS — E782 Mixed hyperlipidemia: Secondary | ICD-10-CM | POA: Diagnosis not present

## 2016-06-30 DIAGNOSIS — I4891 Unspecified atrial fibrillation: Secondary | ICD-10-CM | POA: Diagnosis not present

## 2016-06-30 DIAGNOSIS — G629 Polyneuropathy, unspecified: Secondary | ICD-10-CM

## 2016-06-30 DIAGNOSIS — Z0000 Encounter for general adult medical examination without abnormal findings: Secondary | ICD-10-CM

## 2016-06-30 LAB — CBC
HCT: 39.6 % (ref 39.0–52.0)
Hemoglobin: 13.6 g/dL (ref 13.0–17.0)
MCHC: 34.3 g/dL (ref 30.0–36.0)
MCV: 92.4 fl (ref 78.0–100.0)
PLATELETS: 229 10*3/uL (ref 150.0–400.0)
RBC: 4.28 Mil/uL (ref 4.22–5.81)
RDW: 13.8 % (ref 11.5–15.5)
WBC: 5.2 10*3/uL (ref 4.0–10.5)

## 2016-06-30 LAB — COMPREHENSIVE METABOLIC PANEL
ALBUMIN: 4.2 g/dL (ref 3.5–5.2)
ALK PHOS: 65 U/L (ref 39–117)
ALT: 17 U/L (ref 0–53)
AST: 22 U/L (ref 0–37)
BILIRUBIN TOTAL: 1.2 mg/dL (ref 0.2–1.2)
BUN: 15 mg/dL (ref 6–23)
CO2: 31 mEq/L (ref 19–32)
Calcium: 10.2 mg/dL (ref 8.4–10.5)
Chloride: 105 mEq/L (ref 96–112)
Creatinine, Ser: 0.95 mg/dL (ref 0.40–1.50)
GFR: 104.4 mL/min (ref >60.00)
GLUCOSE: 92 mg/dL (ref 70–99)
Potassium: 4.5 mEq/L (ref 3.5–5.1)
Sodium: 140 mEq/L (ref 135–145)
TOTAL PROTEIN: 8.1 g/dL (ref 6.0–8.3)

## 2016-06-30 LAB — LIPID PANEL
CHOLESTEROL: 132 mg/dL (ref 0–200)
HDL: 59.5 mg/dL (ref >39.00)
LDL Cholesterol: 61 mg/dL (ref 0–99)
NONHDL: 72.95
Total CHOL/HDL Ratio: 2
Triglycerides: 62 mg/dL (ref 0.0–149.0)
VLDL: 12.4 mg/dL (ref 0.0–40.0)

## 2016-06-30 LAB — VITAMIN D 25 HYDROXY (VIT D DEFICIENCY, FRACTURES): VITD: 64.71 ng/mL (ref 30.00–100.00)

## 2016-06-30 LAB — TSH: TSH: 0.98 u[IU]/mL (ref 0.35–4.50)

## 2016-06-30 MED ORDER — VITAMIN D 1000 UNITS PO TABS: 1000.0000 [IU] | ORAL_TABLET | Freq: Every day | ORAL | Status: AC

## 2016-06-30 MED ORDER — VITAMIN B-12 1000 MCG PO TABS: 1000.0000 ug | ORAL_TABLET | Freq: Every day | ORAL | Status: DC

## 2016-06-30 NOTE — Assessment & Plan Note (Signed)
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

## 2016-06-30 NOTE — Assessment & Plan Note (Signed)
Well controlled, no changes to meds. Encouraged heart healthy diet such as the DASH diet and exercise as tolerated.  °

## 2016-06-30 NOTE — Assessment & Plan Note (Signed)
No recent episode, follows with cardiology. Stable. Continue annual exams

## 2016-06-30 NOTE — Assessment & Plan Note (Signed)
Peripherally and noted since stroke. stable

## 2016-06-30 NOTE — Progress Notes (Signed)
Pre visit review using our clinic review tool, if applicable. No additional management support is needed unless otherwise documented below in the visit note. 

## 2016-06-30 NOTE — Patient Instructions (Signed)
Preventive Care for Adults, Male A healthy lifestyle and preventive care can promote health and wellness. Preventive health guidelines for men include the following key practices:  A routine yearly physical is a good way to check with your health care provider about your health and preventative screening. It is a chance to share any concerns and updates on your health and to receive a thorough exam.  Visit your dentist for a routine exam and preventative care every 6 months. Brush your teeth twice a day and floss once a day. Good oral hygiene prevents tooth decay and gum disease.  The frequency of eye exams is based on your age, health, family medical history, use of contact lenses, and other factors. Follow your health care provider's recommendations for frequency of eye exams.  Eat a healthy diet. Foods such as vegetables, fruits, whole grains, low-fat dairy products, and lean protein foods contain the nutrients you need without too many calories. Decrease your intake of foods high in solid fats, added sugars, and salt. Eat the right amount of calories for you.Get information about a proper diet from your health care provider, if necessary.  Regular physical exercise is one of the most important things you can do for your health. Most adults should get at least 150 minutes of moderate-intensity exercise (any activity that increases your heart rate and causes you to sweat) each week. In addition, most adults need muscle-strengthening exercises on 2 or more days a week.  Maintain a healthy weight. The body mass index (BMI) is a screening tool to identify possible weight problems. It provides an estimate of body fat based on height and weight. Your health care provider can find your BMI and can help you achieve or maintain a healthy weight.For adults 20 years and older:  A BMI below 18.5 is considered underweight.  A BMI of 18.5 to 24.9 is normal.  A BMI of 25 to 29.9 is considered  overweight.  A BMI of 30 and above is considered obese.  Maintain normal blood lipids and cholesterol levels by exercising and minimizing your intake of saturated fat. Eat a balanced diet with plenty of fruit and vegetables. Blood tests for lipids and cholesterol should begin at age 20 and be repeated every 5 years. If your lipid or cholesterol levels are high, you are over 50, or you are at high risk for heart disease, you may need your cholesterol levels checked more frequently.Ongoing high lipid and cholesterol levels should be treated with medicines if diet and exercise are not working.  If you smoke, find out from your health care provider how to quit. If you do not use tobacco, do not start.  Lung cancer screening is recommended for adults aged 55-80 years who are at high risk for developing lung cancer because of a history of smoking. A yearly low-dose CT scan of the lungs is recommended for people who have at least a 30-pack-year history of smoking and are a current smoker or have quit within the past 15 years. A pack year of smoking is smoking an average of 1 pack of cigarettes a day for 1 year (for example: 1 pack a day for 30 years or 2 packs a day for 15 years). Yearly screening should continue until the smoker has stopped smoking for at least 15 years. Yearly screening should be stopped for people who develop a health problem that would prevent them from having lung cancer treatment.  If you choose to drink alcohol, do not have more   than 2 drinks per day. One drink is considered to be 12 ounces (355 mL) of beer, 5 ounces (148 mL) of wine, or 1.5 ounces (44 mL) of liquor.  Avoid use of street drugs. Do not share needles with anyone. Ask for help if you need support or instructions about stopping the use of drugs.  High blood pressure causes heart disease and increases the risk of stroke. Your blood pressure should be checked at least every 1-2 years. Ongoing high blood pressure should be  treated with medicines, if weight loss and exercise are not effective.  If you are 34-90 years old, ask your health care provider if you should take aspirin to prevent heart disease.  Diabetes screening is done by taking a blood sample to check your blood glucose level after you have not eaten for a certain period of time (fasting). If you are not overweight and you do not have risk factors for diabetes, you should be screened once every 3 years starting at age 35. If you are overweight or obese and you are 70-84 years of age, you should be screened for diabetes every year as part of your cardiovascular risk assessment.  Colorectal cancer can be detected and often prevented. Most routine colorectal cancer screening begins at the age of 18 and continues through age 69. However, your health care provider may recommend screening at an earlier age if you have risk factors for colon cancer. On a yearly basis, your health care provider may provide home test kits to check for hidden blood in the stool. Use of a small camera at the end of a tube to directly examine the colon (sigmoidoscopy or colonoscopy) can detect the earliest forms of colorectal cancer. Talk to your health care provider about this at age 71, when routine screening begins. Direct exam of the colon should be repeated every 5-10 years through age 18, unless early forms of precancerous polyps or small growths are found.  People who are at an increased risk for hepatitis B should be screened for this virus. You are considered at high risk for hepatitis B if:  You were born in a country where hepatitis B occurs often. Talk with your health care provider about which countries are considered high risk.  Your parents were born in a high-risk country and you have not received a shot to protect against hepatitis B (hepatitis B vaccine).  You have HIV or AIDS.  You use needles to inject street drugs.  You live with, or have sex with, someone who  has hepatitis B.  You are a man who has sex with other men (MSM).  You get hemodialysis treatment.  You take certain medicines for conditions such as cancer, organ transplantation, and autoimmune conditions.  Hepatitis C blood testing is recommended for all people born from [birth year] through 1965 and any individual with known risks for hepatitis C.  Practice safe sex. Use condoms and avoid high-risk sexual practices to reduce the spread of sexually transmitted infections (STIs). STIs include gonorrhea, chlamydia, syphilis, trichomonas, herpes, HPV, and human immunodeficiency virus (HIV). Herpes, HIV, and HPV are viral illnesses that have no cure. They can result in disability, cancer, and death.  If you are a man who has sex with other men, you should be screened at least once per year for:  HIV.  Urethral, rectal, and pharyngeal infection of gonorrhea, chlamydia, or both.  If you are at risk of being infected with HIV, it is recommended that you take a  prescription medicine daily to prevent HIV infection. This is called preexposure prophylaxis (PrEP). You are considered at risk if:  You are a man who has sex with other men (MSM) and have other risk factors.  You are a heterosexual man, are sexually active, and are at increased risk for HIV infection.  You take drugs by injection.  You are sexually active with a partner who has HIV.  Talk with your health care provider about whether you are at high risk of being infected with HIV. If you choose to begin PrEP, you should first be tested for HIV. You should then be tested every 3 months for as long as you are taking PrEP.  A one-time screening for abdominal aortic aneurysm (AAA) and surgical repair of large AAAs by ultrasound are recommended for men ages 44 to 66 years who are current or former smokers.  Healthy men should no longer receive prostate-specific antigen (PSA) blood tests as part of routine cancer screening. Talk with your health  care provider about prostate cancer screening.  Testicular cancer screening is not recommended for adult males who have no symptoms. Screening includes self-exam, a health care provider exam, and other screening tests. Consult with your health care provider about any symptoms you have or any concerns you have about testicular cancer.  Use sunscreen. Apply sunscreen liberally and repeatedly throughout the day. You should seek shade when your shadow is shorter than you. Protect yourself by wearing long sleeves, pants, a wide-brimmed hat, and sunglasses year round, whenever you are outdoors.  Once a month, do a whole-body skin exam, using a mirror to look at the skin on your back. Tell your health care provider about new moles, moles that have irregular borders, moles that are larger than a pencil eraser, or moles that have changed in shape or color.  Stay current with required vaccines (immunizations).  Influenza vaccine. All adults should be immunized every year.  Tetanus, diphtheria, and acellular pertussis (Td, Tdap) vaccine. An adult who has not previously received Tdap or who does not know his vaccine status should receive 1 dose of Tdap. This initial dose should be followed by tetanus and diphtheria toxoids (Td) booster doses every 10 years. Adults with an unknown or incomplete history of completing a 3-dose immunization series with Td-containing vaccines should begin or complete a primary immunization series including a Tdap dose. Adults should receive a Td booster every 10 years.  Varicella vaccine. An adult without evidence of immunity to varicella should receive 2 doses or a second dose if he has previously received 1 dose.  Human papillomavirus (HPV) vaccine. Males aged 11-21 years who have not received the vaccine previously should receive the 3-dose series. Males aged 22-26 years may be immunized. Immunization is recommended through the age of 23 years for any male who has sex with males  and did not get any or all doses earlier. Immunization is recommended for any person with an immunocompromised condition through the age of 72 years if he did not get any or all doses earlier. During the 3-dose series, the second dose should be obtained 4-8 weeks after the first dose. The third dose should be obtained 24 weeks after the first dose and 16 weeks after the second dose.  Zoster vaccine. One dose is recommended for adults aged 23 years or older unless certain conditions are present.  Measles, mumps, and rubella (MMR) vaccine. Adults born before [birth year] generally are considered immune to measles and mumps. Adults born in [birth year]  or later should have 1 or more doses of MMR vaccine unless there is a contraindication to the vaccine or there is laboratory evidence of immunity to each of the three diseases. A routine second dose of MMR vaccine should be obtained at least 28 days after the first dose for students attending postsecondary schools, health care workers, or international travelers. People who received inactivated measles vaccine or an unknown type of measles vaccine during 1963-1967 should receive 2 doses of MMR vaccine. People who received inactivated mumps vaccine or an unknown type of mumps vaccine before 1979 and are at high risk for mumps infection should consider immunization with 2 doses of MMR vaccine. Unvaccinated health care workers born before [birth year] who lack laboratory evidence of measles, mumps, or rubella immunity or laboratory confirmation of disease should consider measles and mumps immunization with 2 doses of MMR vaccine or rubella immunization with 1 dose of MMR vaccine.  Pneumococcal 13-valent conjugate (PCV13) vaccine. When indicated, a person who is uncertain of his immunization history and has no record of immunization should receive the PCV13 vaccine. All adults 59 years of age and older should receive this vaccine. An adult aged 15 years or older who has certain medical  conditions and has not been previously immunized should receive 1 dose of PCV13 vaccine. This PCV13 should be followed with a dose of pneumococcal polysaccharide (PPSV23) vaccine. Adults who are at high risk for pneumococcal disease should obtain the PPSV23 vaccine at least 8 weeks after the dose of PCV13 vaccine. Adults older than 59 years of age who have normal immune system function should obtain the PPSV23 vaccine dose at least 1 year after the dose of PCV13 vaccine.  Pneumococcal polysaccharide (PPSV23) vaccine. When PCV13 is also indicated, PCV13 should be obtained first. All adults aged 37 years and older should be immunized. An adult younger than age 81 years who has certain medical conditions should be immunized. Any person who resides in a nursing home or long-term care facility should be immunized. An adult smoker should be immunized. People with an immunocompromised condition and certain other conditions should receive both PCV13 and PPSV23 vaccines. People with human immunodeficiency virus (HIV) infection should be immunized as soon as possible after diagnosis. Immunization during chemotherapy or radiation therapy should be avoided. Routine use of PPSV23 vaccine is not recommended for American Indians, [place] Natives, or people younger than 65 years unless there are medical conditions that require PPSV23 vaccine. When indicated, people who have unknown immunization and have no record of immunization should receive PPSV23 vaccine. One-time revaccination 5 years after the first dose of PPSV23 is recommended for people aged 19-64 years who have chronic kidney failure, nephrotic syndrome, asplenia, or immunocompromised conditions. People who received 1-2 doses of PPSV23 before age 38 years should receive another dose of PPSV23 vaccine at age 38 years or later if at least 5 years have passed since the previous dose. Doses of PPSV23 are not needed for people immunized with PPSV23 at or after age 40  years.  Meningococcal vaccine. Adults with asplenia or persistent complement component deficiencies should receive 2 doses of quadrivalent meningococcal conjugate (MenACWY-D) vaccine. The doses should be obtained at least 2 months apart. Microbiologists working with certain meningococcal bacteria, [employer] recruits, people at risk during an outbreak, and people who travel to or live in countries with a high rate of meningitis should be immunized. A first-year college student up through age 38 years who is living in a residence hall should receive a  dose if he did not receive a dose on or after his 16th birthday. Adults who have certain high-risk conditions should receive one or more doses of vaccine.  Hepatitis A vaccine. Adults who wish to be protected from this disease, have chronic liver disease, work with hepatitis A-infected animals, work in hepatitis A research labs, or travel to or work in countries with a high rate of hepatitis A should be immunized. Adults who were previously unvaccinated and who anticipate close contact with an international adoptee during the first 60 days after arrival in the [place] from a country with a high rate of hepatitis A should be immunized.  Hepatitis B vaccine. Adults should be immunized if they wish to be protected from this disease, are under age 34 years and have diabetes, have chronic liver disease, have had more than one sex partner in the past 6 months, may be exposed to blood or other infectious body fluids, are household contacts or sex partners of hepatitis B positive people, are clients or workers in certain care facilities, or travel to or work in countries with a high rate of hepatitis B.  Haemophilus influenzae type b (Hib) vaccine. A previously unvaccinated person with asplenia or sickle cell disease or having a scheduled splenectomy should receive 1 dose of Hib vaccine. Regardless of previous immunization, a recipient of a hematopoietic stem cell  transplant should receive a 3-dose series 6-12 months after his successful transplant. Hib vaccine is not recommended for adults with HIV infection. Preventive Service / Frequency Ages 77 to 55  Blood pressure check.** / Every 3-5 years.  Lipid and cholesterol check.** / Every 5 years beginning at age 66.  Hepatitis C blood test.** / For any individual with known risks for hepatitis C.  Skin self-exam. / Monthly.  Influenza vaccine. / Every year.  Tetanus, diphtheria, and acellular pertussis (Tdap, Td) vaccine.** / Consult your health care provider. 1 dose of Td every 10 years.  Varicella vaccine.** / Consult your health care provider.  HPV vaccine. / 3 doses over 6 months, if 45 or younger.  Measles, mumps, rubella (MMR) vaccine.** / You need at least 1 dose of MMR if you were born in 1957 or later. You may also need a second dose.  Pneumococcal 13-valent conjugate (PCV13) vaccine.** / Consult your health care provider.  Pneumococcal polysaccharide (PPSV23) vaccine.** / 1 to 2 doses if you smoke cigarettes or if you have certain conditions.  Meningococcal vaccine.** / 1 dose if you are age 81 to 79 years and a Market researcher living in a residence hall, or have one of several medical conditions. You may also need additional booster doses.  Hepatitis A vaccine.** / Consult your health care provider.  Hepatitis B vaccine.** / Consult your health care provider.  Haemophilus influenzae type b (Hib) vaccine.** / Consult your health care provider. Ages 6 to 58  Blood pressure check.** / Every year.  Lipid and cholesterol check.** / Every 5 years beginning at age 89.  Lung cancer screening. / Every year if you are aged 84-80 years and have a 30-pack-year history of smoking and currently smoke or have quit within the past 15 years. Yearly screening is stopped once you have quit smoking for at least 15 years or develop a health problem that would prevent you from having  lung cancer treatment.  Fecal occult blood test (FOBT) of stool. / Every year beginning at age 90 and continuing until age 73. You may not have to do  this test if you get a colonoscopy every 10 years.  Flexible sigmoidoscopy** or colonoscopy.** / Every 5 years for a flexible sigmoidoscopy or every 10 years for a colonoscopy beginning at age 50 and continuing until age 75.  Hepatitis C blood test.** / For all people born from 1945 through 1965 and any individual with known risks for hepatitis C.  Skin self-exam. / Monthly.  Influenza vaccine. / Every year.  Tetanus, diphtheria, and acellular pertussis (Tdap/Td) vaccine.** / Consult your health care provider. 1 dose of Td every 10 years.  Varicella vaccine.** / Consult your health care provider.  Zoster vaccine.** / 1 dose for adults aged 60 years or older.  Measles, mumps, rubella (MMR) vaccine.** / You need at least 1 dose of MMR if you were born in 1957 or later. You may also need a second dose.  Pneumococcal 13-valent conjugate (PCV13) vaccine.** / Consult your health care provider.  Pneumococcal polysaccharide (PPSV23) vaccine.** / 1 to 2 doses if you smoke cigarettes or if you have certain conditions.  Meningococcal vaccine.** / Consult your health care provider.  Hepatitis A vaccine.** / Consult your health care provider.  Hepatitis B vaccine.** / Consult your health care provider.  Haemophilus influenzae type b (Hib) vaccine.** / Consult your health care provider. Ages 65 and over  Blood pressure check.** / Every year.  Lipid and cholesterol check.**/ Every 5 years beginning at age 20.  Lung cancer screening. / Every year if you are aged 55-80 years and have a 30-pack-year history of smoking and currently smoke or have quit within the past 15 years. Yearly screening is stopped once you have quit smoking for at least 15 years or develop a health problem that would prevent you from having lung cancer treatment.  Fecal  occult blood test (FOBT) of stool. / Every year beginning at age 50 and continuing until age 75. You may not have to do this test if you get a colonoscopy every 10 years.  Flexible sigmoidoscopy** or colonoscopy.** / Every 5 years for a flexible sigmoidoscopy or every 10 years for a colonoscopy beginning at age 50 and continuing until age 75.  Hepatitis C blood test.** / For all people born from 1945 through 1965 and any individual with known risks for hepatitis C.  Abdominal aortic aneurysm (AAA) screening.** / A one-time screening for ages 65 to 75 years who are current or former smokers.  Skin self-exam. / Monthly.  Influenza vaccine. / Every year.  Tetanus, diphtheria, and acellular pertussis (Tdap/Td) vaccine.** / 1 dose of Td every 10 years.  Varicella vaccine.** / Consult your health care provider.  Zoster vaccine.** / 1 dose for adults aged 60 years or older.  Pneumococcal 13-valent conjugate (PCV13) vaccine.** / 1 dose for all adults aged 65 years and older.  Pneumococcal polysaccharide (PPSV23) vaccine.** / 1 dose for all adults aged 65 years and older.  Meningococcal vaccine.** / Consult your health care provider.  Hepatitis A vaccine.** / Consult your health care provider.  Hepatitis B vaccine.** / Consult your health care provider.  Haemophilus influenzae type b (Hib) vaccine.** / Consult your health care provider. **Family history and personal history of risk and conditions may change your health care provider's recommendations.   This information is not intended to replace advice given to you by your health care provider. Make sure you discuss any questions you have with your health care provider.   Document Released: 11/01/2001 Document Revised: 09/26/2014 Document Reviewed: 01/31/2011 Elsevier Interactive Patient Education 2016   [employer] Vitamin D Deficiency Vitamin D deficiency is when your body does not have enough vitamin D. Vitamin D is important to your  body for many reasons:  It helps the body to absorb two important minerals, called calcium and phosphorus.  It plays a role in bone health.  It may help to prevent some diseases, such as diabetes and multiple sclerosis.  It plays a role in muscle function, including heart function. You can get vitamin D by:  Eating foods that naturally contain vitamin D.  Eating or drinking milk or other dairy products that have vitamin D added to them.  Taking a vitamin D supplement or a multivitamin supplement that contains vitamin D.  Being in the sun. Your body naturally makes vitamin D when your skin is exposed to sunlight. Your body changes the sunlight into a form of the vitamin that the body can use. If vitamin D deficiency is severe, it can cause a condition in which your bones become soft. In adults, this condition is called osteomalacia. In children, this condition is called rickets. CAUSES Vitamin D deficiency may be caused by:  Not eating enough foods that contain vitamin D.  Not getting enough sun exposure.  Having certain digestive system diseases that make it difficult for your body to absorb vitamin D. These diseases include Crohn disease, chronic pancreatitis, and cystic fibrosis.  Having a surgery in which a part of the stomach or a part of the small intestine is removed.  Being obese.  Having chronic kidney disease or liver disease. RISK FACTORS This condition is more likely to develop in:  Older people.  People who do not spend much time outdoors.  People who live in a long-term care facility.  People who have had broken bones.  People with weak or thin bones (osteoporosis).  People who have a disease or condition that changes how the body absorbs vitamin D.  People who have dark skin.  People who take certain medicines, such as steroid medicines or certain seizure medicines.  People who are overweight or obese. SYMPTOMS In mild cases of vitamin D deficiency,  there may not be any symptoms. If the condition is severe, symptoms may include:  Bone pain.  Muscle pain.  Falling often.  Broken bones caused by a minor injury. DIAGNOSIS This condition is usually diagnosed with a blood test.  TREATMENT Treatment for this condition may depend on what caused the condition. Treatment options include:  Taking vitamin D supplements.  Taking a calcium supplement. Your health care provider will suggest what dose is best for you. HOME CARE INSTRUCTIONS  Take medicines and supplements only as told by your health care provider.  Eat foods that contain vitamin D. Choices include:  Fortified dairy products, cereals, or juices. Fortified means that vitamin D has been added to the food. Check the label on the package to be sure.  Fatty fish, such as salmon or trout.  Eggs.  Oysters.  Do not use a tanning bed.  Maintain a healthy weight. Lose weight, if needed.  Keep all follow-up visits as told by your health care provider. This is important. SEEK MEDICAL CARE IF:  Your symptoms do not go away.  You feel like throwing up (nausea) or you throw up (vomit).  You have fewer bowel movements than usual or it is difficult for you to have a bowel movement (constipation).   This information is not intended to replace advice given to you by your health care provider. Make  sure you discuss any questions you have with your health care provider.   Document Released: 11/28/2011 Document Revised: 05/27/2015 Document Reviewed: 01/21/2015 Elsevier Interactive Patient Education [employer].

## 2016-07-06 ENCOUNTER — Ambulatory Visit: Payer: Managed Care, Other (non HMO)

## 2016-07-06 NOTE — Progress Notes (Signed)
Patient ID: Tyler Sims, male   DOB: [birth year], 60 y.o.   MRN: [id number]   Subjective:    Patient ID: Tyler Sims, male    DOB: 10-Oct-1955, 60 y.o.   MRN: [id number]  Chief Complaint  Patient presents with  . Annual Exam    HPI Patient is in today for annual preventative exam and follow up on numeorus chronic medical concerns. No recent illness or hospitalization. No acute concerns although he did injure his left knee at work recently. He is trying to maintain a heart healthy diet. Denies CP/palp/SOB/HA/congestion/fevers/GI or GU c/o. Taking meds as prescribed  Past Medical History:  Diagnosis Date  . Abnormal transaminases   . Acid reflux   . Bradycardia    nocturnal  . Colon polyps 02/07/2012   Dr. Silvano Rusk  . HTN (hypertension) 06/02/2014  . Low back pain 06/15/2015  . Measles ([place])   . Mitral regurgitation   . Mumps   . Neuropathy ([place]) 01/10/2016  . NICM (nonischemic cardiomyopathy) ([place])   . Persistent atrial fibrillation ([place]) 08/24/2010  . Preventative health care 12/04/2013   Colonsocopy May '13 - adenomatous polyps  Immunizations Tdap Jan '14   . Stroke ([place]) 08/24/2010  . Systolic CHF ([place])   . Urinary hesitancy 12/21/2014  . Varicella   . Ventricular tachycardia, non-sustained [facility])     Past Surgical History:  Procedure Laterality Date  . CARDIAC CATHETERIZATION    . CARDIOVERSION  08/15/2012   Procedure: CARDIOVERSION;  Surgeon: Deboraha Sprang, MD;  Location: [facility] ENDOSCOPY;  Service: Cardiovascular;  Laterality: N/A;  . COLONOSCOPY W/ BIOPSIES  02/07/2012   Dr. Silvano Rusk    Family History  Problem Relation Age of Onset  . Alzheimer's disease Mother   . Lung cancer Brother     x 2, smokers  . Hypertension Brother   . Heart disease Brother   . Cancer Brother   . Lung cancer Brother   . Hypertension Brother   . Colon polyps Brother   . Hypertension Brother   . Hypertension Sister   . Neuropathy Sister   . Arthritis Sister   . Heart  disease Father     CAD/MI  . Hypertension Father   . Mental illness Father     h/o depression requiring hospitalization  . Hyperlipidemia Sister     x 2  . Hypertension Sister   . Arthritis Sister   . Obesity Sister   . Birth defects Paternal Aunt     Social History   Social History  . Marital status: Married    Spouse name: Charlett Nose  . Number of children: 2  . Years of education: 16   Occupational History  . Nature conservation officer   .  [employer]   Social History Main Topics  . Smoking status: Never Smoker  . Smokeless tobacco: Never Used  . Alcohol use 0.0 oz/week     Comment: occasional wine  . Drug use: No  . Sexual activity: Yes    Partners: Female     Comment: lives with wife, no dietary restrictions, except avoid dairy, uses [employer], works with [employer]   Other Topics Concern  . Not on file   Social History Narrative   Patient is married with 2 children. 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   Patient has 16 years of  education.   Patient drinks 1-2 cups daily.    Outpatient Medications Prior to Visit  Medication Sig Dispense Refill  . atorvastatin (LIPITOR) 20 MG tablet TAKE 1 TABLET BY MOUTH ONCE DAILY AT 6PM 90 tablet 0  . COD LIVER OIL PO Take 1 capsule by mouth daily.     . Coenzyme Q10 (CO Q-10) 200 MG CAPS Take 200 mg by mouth daily.     . Diclofenac Sodium (PENNSAID) 2 % SOLN Place 2 application onto the skin 2 (two) times daily. 112 g 3  . gabapentin (NEURONTIN) 600 MG tablet Take 1 tablet (600 mg total) by mouth 3 (three) times daily. 90 tablet 5  . losartan (COZAAR) 25 MG tablet TAKE 1 TABLET BY MOUTH EVERY DAY 30 tablet 11  . methocarbamol (ROBAXIN) 500 MG tablet Take 1 tablet (500 mg total) by mouth 2 (two) times daily as needed for muscle spasms. 40 tablet 1  . metoprolol succinate (TOPROL-XL) 25 MG 24 hr tablet TAKE 1 TABLET BY  MOUTH EVERY DAY 90 tablet 2  . spironolactone (ALDACTONE) 25 MG tablet TAKE 1/2 TABLET BY MOUTH DAILY 15 tablet 11  . warfarin (COUMADIN) 5 MG tablet TAKE AS DIRECTED BY ANTICOAGULATION CLINIC 40 tablet 0   No facility-administered medications prior to visit.     Allergies  Allergen Reactions  . Lisinopril Rash  . Simvastatin Rash    Review of Systems  Constitutional: Negative for chills, fever and malaise/fatigue.  HENT: Negative for congestion and hearing loss.   Eyes: Negative for discharge.  Respiratory: Negative for cough, sputum production and shortness of breath.   Cardiovascular: Negative for chest pain, palpitations and leg swelling.  Gastrointestinal: Negative for abdominal pain, blood in stool, constipation, diarrhea, heartburn, nausea and vomiting.  Genitourinary: Negative for dysuria, frequency, hematuria and urgency.  Musculoskeletal: Positive for joint pain. Negative for back pain, falls and myalgias.  Skin: Negative for rash.  Neurological: Negative for dizziness, sensory change, loss of consciousness, weakness and headaches.  Endo/Heme/Allergies: Negative for environmental allergies. Does not bruise/bleed easily.  Psychiatric/Behavioral: Negative for depression and suicidal ideas. The patient is not nervous/anxious and does not have insomnia.        Objective:    Physical Exam  Constitutional: He is oriented to person, place, and time. He appears well-developed and well-nourished. No distress.  HENT:  Head: Normocephalic and atraumatic.  Eyes: Conjunctivae are normal.  Neck: Neck supple. No thyromegaly present.  Cardiovascular: Normal rate, regular rhythm and normal heart sounds.   No murmur heard. Pulmonary/Chest: Effort normal and breath sounds normal. No respiratory distress. He has no wheezes.  Abdominal: Soft. Bowel sounds are normal. He exhibits no mass. There is no tenderness.  Musculoskeletal: He exhibits no edema.  Lymphadenopathy:    He has no  cervical adenopathy.  Neurological: He is alert and oriented to person, place, and time.  Skin: Skin is warm and dry.  Psychiatric: He has a normal mood and affect. His behavior is normal.    BP 130/76 (BP Location: Right Arm, Patient Position: Sitting, Cuff Size: Normal)   Pulse (!) 52   Temp 98.6 F (37 C) (Oral)   Ht 6\' 3"  (1.905 m)   Wt 217 lb 8 oz (98.7 kg)   SpO2 100%   BMI 27.19 kg/m  Wt Readings from Last 3 Encounters:  06/30/16 217 lb 8 oz (98.7 kg)  06/22/16 217 lb 6.4 oz (98.6 kg)  06/01/16 218 lb (98.9 kg)     Lab Results  Component Value Date   WBC 5.2 06/30/2016   HGB 13.6 06/30/2016   HCT 39.6 06/30/2016   PLT 229.0 06/30/2016   GLUCOSE 92 06/30/2016   CHOL 132 06/30/2016   TRIG 62.0 06/30/2016   HDL 59.50 06/30/2016   LDLDIRECT 110.4 10/03/2012   LDLCALC 61 06/30/2016   ALT 17 06/30/2016   AST 22 06/30/2016   NA 140 06/30/2016   K 4.5 06/30/2016   CL 105 06/30/2016   CREATININE 0.95 06/30/2016   BUN 15 06/30/2016   CO2 31 06/30/2016   TSH 0.98 06/30/2016   PSA 0.95 12/09/2014   INR 2.3 05/25/2016   HGBA1C  08/28/2010    4.7 (NOTE)                                                                       According to the ADA Clinical Practice Recommendations for 2011, when HbA1c is used as a screening test:   >=6.5%   Diagnostic of Diabetes Mellitus           (if abnormal result  is confirmed)  5.7-6.4%   Increased risk of developing Diabetes Mellitus  References:Diagnosis and Classification of Diabetes Mellitus,Diabetes [id number] 1):S62-S69 and Standards of Medical Care in         Diabetes - 2011,Diabetes Care,2011,34  (Suppl 1):S11-S61.    Lab Results  Component Value Date   TSH 0.98 06/30/2016   Lab Results  Component Value Date   WBC 5.2 06/30/2016   HGB 13.6 06/30/2016   HCT 39.6 06/30/2016   MCV 92.4 06/30/2016   PLT 229.0 06/30/2016   Lab Results  Component Value Date   NA 140 06/30/2016   K 4.5 06/30/2016   CO2 31  06/30/2016   GLUCOSE 92 06/30/2016   BUN 15 06/30/2016   CREATININE 0.95 06/30/2016   BILITOT 1.2 06/30/2016   ALKPHOS 65 06/30/2016   AST 22 06/30/2016   ALT 17 06/30/2016   PROT 8.1 06/30/2016   ALBUMIN 4.2 06/30/2016   CALCIUM 10.2 06/30/2016   GFR 104.40 06/30/2016   Lab Results  Component Value Date   CHOL 132 06/30/2016   Lab Results  Component Value Date   HDL 59.50 06/30/2016   Lab Results  Component Value Date   LDLCALC 61 06/30/2016   Lab Results  Component Value Date   TRIG 62.0 06/30/2016   Lab Results  Component Value Date   CHOLHDL 2 06/30/2016   Lab Results  Component Value Date   HGBA1C  08/28/2010    4.7 (NOTE)                                                                       According to the ADA Clinical Practice Recommendations for 2011, when HbA1c is used as a screening test:   >=6.5%   Diagnostic of Diabetes Mellitus           (if abnormal result  is confirmed)  5.7-6.4%   Increased risk of developing Diabetes  [place]  References:Diagnosis and Classification of Diabetes Mellitus,Diabetes D8842878 1):S62-S69 and Standards of Medical Care in         Diabetes - 2011,Diabetes [id number]  (Suppl 1):S11-S61.       Assessment & Plan:   Problem List Items Addressed This Visit    Hyperlipidemia    Tolerating statin, encouraged heart healthy diet, avoid trans fats, minimize simple carbs and saturated fats. Increase exercise as tolerated      Relevant Orders   Lipid panel (Completed)   Atrial fibrillation ([place]) - Primary    No recent episode, follows with cardiology. Stable. Continue annual exams      Preventative health care    Patient encouraged to maintain heart healthy diet, regular exercise, adequate sleep. Consider daily probiotics. Take medications as prescribed. Given and reviewed copy of ACP documents from [employer] and encouraged to complete and return      History of CVA (cerebrovascular accident) without  residual deficits    Stroke back about 6 years ago, follows with Dr Leonie Man, had TPA no residual symptoms.       HTN (hypertension)    Well controlled, no changes to meds. Encouraged heart healthy diet such as the DASH diet and exercise as tolerated.       Relevant Orders   Comprehensive metabolic panel (Completed)   CBC (Completed)   TSH (Completed)   Vitamin D deficiency   Relevant Orders   VITAMIN D 25 Hydroxy (Vit-D Deficiency, Fractures) (Completed)   Low back pain   Neuropathy ([place])    Peripherally and noted since stroke. stable      Acute medial meniscal tear, left, initial encounter    Fell at work and injured his knee it is slowly improving       Other Visit Diagnoses   None.     I have discontinued Mr. Canan's vitamin B-12. I am also having him start on cholecalciferol. Additionally, I am having him maintain his COD LIVER OIL PO, Co Q-10, methocarbamol, gabapentin, metoprolol succinate, spironolactone, losartan, atorvastatin, warfarin, and Diclofenac Sodium.  Meds ordered this encounter  Medications  . DISCONTD: vitamin B-12 (CYANOCOBALAMIN) 1000 MCG tablet    Sig: Take 1 tablet (1,000 mcg total) by mouth daily.  . cholecalciferol (VITAMIN D) 1000 units tablet    Sig: Take 1 tablet (1,000 Units total) by mouth daily.     Penni Homans, MD

## 2016-07-06 NOTE — Assessment & Plan Note (Signed)
Patient encouraged to maintain heart healthy diet, regular exercise, adequate sleep. Consider daily probiotics. Take medications as prescribed. Given and reviewed copy of ACP documents from [place] Secretary of State and encouraged to complete and return 

## 2016-07-06 NOTE — Assessment & Plan Note (Signed)
Tolerating statin, encouraged heart healthy diet, avoid trans fats, minimize simple carbs and saturated fats. Increase exercise as tolerated 

## 2016-07-06 NOTE — Assessment & Plan Note (Signed)
Fell at work and injured his knee it is slowly improving

## 2016-07-07 ENCOUNTER — Other Ambulatory Visit: Payer: Managed Care, Other (non HMO)

## 2016-07-07 DIAGNOSIS — M25562 Pain in left knee: Secondary | ICD-10-CM

## 2016-07-13 ENCOUNTER — Ambulatory Visit (INDEPENDENT_AMBULATORY_CARE_PROVIDER_SITE_OTHER): Payer: Managed Care, Other (non HMO) | Admitting: General Practice

## 2016-07-13 DIAGNOSIS — I4891 Unspecified atrial fibrillation: Secondary | ICD-10-CM | POA: Diagnosis not present

## 2016-07-13 DIAGNOSIS — Z7901 Long term (current) use of anticoagulants: Secondary | ICD-10-CM

## 2016-07-13 DIAGNOSIS — I639 Cerebral infarction, unspecified: Secondary | ICD-10-CM

## 2016-07-13 LAB — POCT INR: INR: 2.6

## 2016-07-13 NOTE — Patient Instructions (Signed)
Pre visit review using our clinic review tool, if applicable. No additional management support is needed unless otherwise documented below in the visit note. 

## 2016-07-13 NOTE — Progress Notes (Signed)
I have reviewed and agree with the plan. 

## 2016-07-18 ENCOUNTER — Other Ambulatory Visit: Payer: Self-pay | Admitting: Family Medicine

## 2016-07-20 ENCOUNTER — Encounter: Payer: Self-pay | Admitting: Family Medicine

## 2016-07-20 ENCOUNTER — Ambulatory Visit (INDEPENDENT_AMBULATORY_CARE_PROVIDER_SITE_OTHER): Payer: Managed Care, Other (non HMO) | Admitting: Family Medicine

## 2016-07-20 ENCOUNTER — Ambulatory Visit: Payer: Self-pay

## 2016-07-20 VITALS — BP 122/82 | HR 75 | Ht 75.0 in | Wt 219.0 lb

## 2016-07-20 DIAGNOSIS — M25562 Pain in left knee: Secondary | ICD-10-CM | POA: Diagnosis not present

## 2016-07-20 DIAGNOSIS — S83242A Other tear of medial meniscus, current injury, left knee, initial encounter: Secondary | ICD-10-CM | POA: Diagnosis not present

## 2016-07-20 NOTE — Assessment & Plan Note (Signed)
Patient appears to be doing well. Seems to be healing to some degree. Continue the topical anti-inflammatories icing and home exercises. Follow-up again in 4-6 week. If any worsening symptoms such as locking or giving out patient will return earlier and we may need advance imaging.

## 2016-07-20 NOTE — Progress Notes (Signed)
[facility] [address] [place], [place] [address] Phone: [phone] Subjective:       CC: Left knee pain f/u  [id number]  Tyler Sims is a 60 y.o. male coming in with complaint of left knee pain. Patient did see me previously and was diagnosed with an acute meniscal injury. Patient elected try conservative therapy including home exercises and I simply call. Patient was to avoid twisting motions. Patient given topical anti-inflammatories. Patient states mild tightness of the hamstring. Otherwise patient states that he is feeling about 85-90% better. No locking, no giving out on him, no radiation down the leg.   X-rays were independently visualized by me. X-rays taken 06/01/2016 were unremarkable for any bony normality. Past Medical History:  Diagnosis Date  . Abnormal transaminases   . Acid reflux   . Bradycardia    nocturnal  . Colon polyps 02/07/2012   Dr. Silvano Rusk  . HTN (hypertension) 06/02/2014  . Low back pain 06/15/2015  . Measles ([place])   . Mitral regurgitation   . Mumps   . Neuropathy ([place]) 01/10/2016  . NICM (nonischemic cardiomyopathy) ([place])   . Persistent atrial fibrillation ([place]) 08/24/2010  . Preventative health care 12/04/2013   Colonsocopy May '13 - adenomatous polyps  Immunizations Tdap Jan '14   . Stroke ([place]) 08/24/2010  . Systolic CHF ([place])   . Urinary hesitancy 12/21/2014  . Varicella   . Ventricular tachycardia, non-sustained [facility])    Past Surgical History:  Procedure Laterality Date  . CARDIAC CATHETERIZATION    . CARDIOVERSION  08/15/2012   Procedure: CARDIOVERSION;  Surgeon: Deboraha Sprang, MD;  Location: [place];  Service: Cardiovascular;  Laterality: N/A;  . COLONOSCOPY W/ BIOPSIES  02/07/2012   Dr. Silvano Rusk   Social History   Social History  . Marital status: Married    Spouse name: Tyler Sims  . Number of children: 2  . Years of education: 16   Occupational History  . Nature conservation officer     .  [employer]   Social History Main Topics  . Smoking status: Never Smoker  . Smokeless tobacco: Never Used  . Alcohol use 0.0 oz/week     Comment: occasional wine  . Drug use: No  . Sexual activity: Yes    Partners: Female     Comment: lives with wife, no dietary restrictions, except avoid dairy, uses Lact[employer], works with [employer]   Other Topics Concern  . Not on file   Social History Narrative   Patient is married with 2 children. 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   Patient has 16 years of education.   Patient drinks 1-2 cups daily.   Allergies  Allergen Reactions  . Lisinopril Rash  . Simvastatin Rash   Family History  Problem Relation Age of Onset  . Alzheimer's disease Mother   . Lung cancer Brother     x 2, smokers  . Hypertension Brother   . Heart disease Brother   . Cancer Brother   . Lung cancer Brother   . Hypertension Brother   . Colon polyps Brother   . Hypertension Brother   . Hypertension Sister   . Neuropathy Sister   . Arthritis Sister   . Heart disease Father     CAD/MI  . Hypertension Father   . Mental  illness Father     h/o depression requiring hospitalization  . Hyperlipidemia Sister     x 2  . Hypertension Sister   . Arthritis Sister   . Obesity Sister   . Birth defects Paternal Aunt     Past medical history, social, surgical and family history all reviewed in electronic medical record.  No pertanent information unless stated regarding to the chief complaint.   Review of Systems: No headache, visual changes, nausea, vomiting, diarrhea, constipation, dizziness, abdominal pain, skin rash, fevers, chills, night sweats, weight loss, swollen lymph nodes, body aches, joint swelling, muscle aches, chest pain, shortness of breath, mood changes.   Objective  There were no vitals taken for this visit.  Systems examined below as  of 07/20/16   General: Alert nor and 3 with no apparent distress HEENT: Pupils equal, extraocular movements intact  Respiratory: Patient's speak in full sentences and does not appear short of breath  Cardiovascular: No lower extremity edema, non tender, no erythema  Skin: Warm dry intact with no signs of infection or rash on extremities or on axial skeleton.  Abdomen: Soft nontender Neuro: Cranial nerves intact neurovascularly intact in all extremities with 2+ DTRs and 2+ pulses.  Lymph: No lymphadenopathy Gait normal with good balance and coordination.  MSK:  Non tender with full range of motion and good stability and symmetric strength and tone of shoulders, elbows, wrist, hip and ankles bilaterally.  Knee: Left Normal to inspection with no erythema or effusion or obvious bony abnormalities. Very mild tenderness to palpation in the medial joint line ROM full in flexion and extension and lower leg rotation. Ligaments with solid consistent endpoints including ACL, PCL, LCL, MCL. Negative Mcmurray's, Apley's, and Thessalonian tests. Non painful patellar compression. Patellar glide with minimal crepitus. Patellar and quadriceps tendons unremarkable. Hamstring and quadriceps strength is normal.  Contralateral knee unremarkable Improvement from previous exam  MSK US performed of: Left This study was ordered, performed, and interpreted by Charlann Boxer D.O.  Knee: All structures visualized. Posterior medial meniscus does continue to have tear but significant decrease in displacement. The only presently 5% displaced. No significant hypoechoic changes.  IMPRESSION: Posterior medial meniscal tear with less displacement than prior exam.       Impression and Recommendations:     This case required medical decision making of moderate complexity.      Note: This dictation was prepared with Dragon dictation along with smaller phrase technology. Any transcriptional errors that result  from this process are unintentional.

## 2016-07-20 NOTE — Patient Instructions (Addendum)
Good to see you  I think you are making progress  Keep up with the icing and exercises Should be pain free in 4 weeks See me again in 4 weeks if not perfect

## 2016-08-02 ENCOUNTER — Other Ambulatory Visit: Payer: Self-pay | Admitting: Neurology

## 2016-08-14 ENCOUNTER — Other Ambulatory Visit: Payer: Self-pay | Admitting: Family Medicine

## 2016-08-15 ENCOUNTER — Other Ambulatory Visit: Payer: Self-pay | Admitting: General Practice

## 2016-08-15 MED ORDER — WARFARIN SODIUM 5 MG PO TABS: ORAL_TABLET | ORAL | 5 refills | Status: DC

## 2016-08-24 ENCOUNTER — Ambulatory Visit (INDEPENDENT_AMBULATORY_CARE_PROVIDER_SITE_OTHER): Payer: Managed Care, Other (non HMO) | Admitting: General Practice

## 2016-08-24 DIAGNOSIS — I4891 Unspecified atrial fibrillation: Secondary | ICD-10-CM

## 2016-08-24 DIAGNOSIS — I639 Cerebral infarction, unspecified: Secondary | ICD-10-CM

## 2016-08-24 DIAGNOSIS — Z7901 Long term (current) use of anticoagulants: Secondary | ICD-10-CM

## 2016-08-24 LAB — POCT INR: INR: 2.7

## 2016-08-24 NOTE — Patient Instructions (Signed)
Pre visit review using our clinic review tool, if applicable. No additional management support is needed unless otherwise documented below in the visit note. 

## 2016-08-24 NOTE — Progress Notes (Signed)
I have reviewed and agree with the plan. 

## 2016-09-02 ENCOUNTER — Other Ambulatory Visit: Payer: Self-pay | Admitting: Adult Health

## 2016-10-01 ENCOUNTER — Other Ambulatory Visit: Payer: Self-pay | Admitting: Family Medicine

## 2016-10-05 ENCOUNTER — Ambulatory Visit: Payer: Managed Care, Other (non HMO)

## 2016-10-06 ENCOUNTER — Ambulatory Visit: Payer: Managed Care, Other (non HMO) | Admitting: Neurology

## 2016-10-12 ENCOUNTER — Ambulatory Visit (INDEPENDENT_AMBULATORY_CARE_PROVIDER_SITE_OTHER): Payer: Managed Care, Other (non HMO) | Admitting: General Practice

## 2016-10-12 DIAGNOSIS — I639 Cerebral infarction, unspecified: Secondary | ICD-10-CM

## 2016-10-12 DIAGNOSIS — I4891 Unspecified atrial fibrillation: Secondary | ICD-10-CM

## 2016-10-12 DIAGNOSIS — Z7901 Long term (current) use of anticoagulants: Secondary | ICD-10-CM

## 2016-10-12 LAB — POCT INR: INR: 2.1

## 2016-10-12 NOTE — Patient Instructions (Signed)
Pre visit review using our clinic review tool, if applicable. No additional management support is needed unless otherwise documented below in the visit note. 

## 2016-11-16 ENCOUNTER — Ambulatory Visit (INDEPENDENT_AMBULATORY_CARE_PROVIDER_SITE_OTHER): Payer: Managed Care, Other (non HMO) | Admitting: Neurology

## 2016-11-16 ENCOUNTER — Encounter: Payer: Self-pay | Admitting: Neurology

## 2016-11-16 VITALS — BP 135/85 | HR 70 | Ht 75.0 in | Wt 227.6 lb

## 2016-11-16 DIAGNOSIS — I6529 Occlusion and stenosis of unspecified carotid artery: Secondary | ICD-10-CM

## 2016-11-16 DIAGNOSIS — G629 Polyneuropathy, unspecified: Secondary | ICD-10-CM

## 2016-11-16 NOTE — Progress Notes (Signed)
PATIENT: Tyler Sims DOB: [birth year]  REASON FOR VISIT: follow up- stroke, sensory neuropathy HISTORY FROM: patient  HISTORY OF PRESENT ILLNESS: Today 04/05/2016: Mr. Tyler Sims is a 61 year old male with a history of stroke and sensory neuropathy. He returns today for follow-up. At the last visit gabapentin was increased to 400 mg 3 times a day. He states that this helped a little although he continues to have the burning sensation continuously. He states that it is primarily located below the knee on both legs. He reports that it occurs throughout the day when he is sitting or standing. He states that it is tolerable but is constant. He continues on Coumadin. Denies any significant bruising or bleeding. His primary care is managing his blood pressure and cholesterol. He does not smoke. He returns today for an evaluation.  HISTORY 10/06/15 (MM): Mr. [name] is a 61 year old male with a history of stroke and sensory neuropathy. He returns today for follow-up. The patient continues on warfarin for secondary stroke prevention. He denies any significant bleeding or bruising. He reports that his blood pressure has remained in normal range. His primary care checks his cholesterol and hemoglobin regularly. He states that he was told that his blood work was in normal range. The patient has no residual effects from the stroke. The patient reports that he does have neuropathy that extends from the feet to the knees. He states that he will have burning and tingling pain. He states that when he is busy tends to not notice the discomfort however when he is at rest the discomfort seems to worsen. He is currently on gabapentin 300 mg 3 times a day. He feels that he has gotten some benefit from this however he continues to get the burning and tingling pain throughout the day. He denies any changes with his gait or balance. He denies any recent falls. He returns today for an evaluation.  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 Patient is in quite well with near complete recovery and full functional independence.  Returns for followup since last visit on 03/15/2012. He has noticed right knee pain with some numbness to great toe and second, third toe in the last month. He does work standing for long periods. Denies back pain, tingling or weakness. Continues warfarin tolerating well, has had mild fluctuations of his INR. He also had cardioversion for atrial fibrillation in which he is converted to a regular rhythm on 08/15/2012. Blood pressure at home ranges 937D to 428J systolic; today in office 168/86. Labs done, his LDLs 91, total cholesterol 174, next appointment with PCP January 2014. He has had a 5 pound weight gain since his last visit.   04/05/2012 transcranial Doppler is consistent with signs of mild stenosis involving the proximal EA. An abnormally elevated CBFV's in the right/left M1s, A1s, C1s and proximal BA, possibly due to minor stenotic narrowing of these vessels.   04/05/2012 carotid Doppler showed an abnormally elevated blood flow velocities seen in the right/left proximal CCAs and consistent with 50-69% stenosis of these vessels, but this finding is not supported by presence of plaques.   02/26/2013 Patient here for routine followup. States he's doing well since last visit and has recently been changed from Coumadin to Xarelto  by cardiologist Steven Klein. Recent cholesterol test elevated LDL 110, total 210, PCP increased Pravachol to 80mg. He c/o leg muscle/joint aches, and PCP is having him hold statin x 1 month to see if it makes a difference. It has been 2 weeks with no change. Patient states his  BP's usually run 120-130's systolic, is 149/80 in office today. Patient is not diabetic. Patient denies medication side effects, with no signs of bleeding. No new neurological symptoms.  .Update 11/16/2016 : He returns for follow-up after last visit 6 months ago. He states increasing the dose of gabapentin to 600 mg 3 times daily has helped with paresthesias but he still has some good days and bad days. Mostly he can tolerate his paresthesias and are not as bothersome. He complains of mild generalized weakness but has had no episodes or history of stroke or TIA since his initial stroke in 2011. He remains on warfarin which is tolerating well with a fairly stable INR and no bleeding or bruising. He states his blood pressure is well controlled and today it is 135/85. Is tolerating Lipitor well without muscle aches or pains. His last lipid profile checked in October was satisfactory. He tore a meniscus in his left knee last August and has pain from that with still bothering him. He has not had follow-up carotid ultrasound done in more than a year. He has no new neurological complaints today.   REVIEW OF SYSTEMS: Out of a complete 14 system review of symptoms, the patient complains only of the following symptoms, and all other reviewed systems are negative.  Palpitations, joint pain, numbness, fatigue, back pain, walking difficulty, weakness and all other systems negative  ALLERGIES: Allergies  Allergen Reactions  . Lisinopril Rash  . Simvastatin Rash    HOME MEDICATIONS: Outpatient Medications Prior to Visit  Medication Sig Dispense Refill  . atorvastatin (LIPITOR) 20 MG tablet TAKE 1 TABLET BY MOUTH ONCE DAILY AT 6PM 90 tablet 0  . cholecalciferol (VITAMIN D) 1000 units tablet Take 1 tablet (1,000 Units total) by mouth daily.    . COD LIVER OIL PO Take 1 capsule by mouth daily.     . Coenzyme Q10 (CO Q-10) 200 MG CAPS Take 200 mg by mouth daily.     . Diclofenac Sodium (PENNSAID) 2 % SOLN Place  2 application onto the skin 2 (two) times daily. 112 g 3  . gabapentin (NEURONTIN) 600 MG tablet TAKE 1 TABLET BY MOUTH 3 TIMES A DAY 270 tablet 1  . losartan (COZAAR) 25 MG tablet TAKE 1 TABLET BY MOUTH EVERY DAY 30 tablet 11  . methocarbamol (ROBAXIN) 500 MG tablet Take 1 tablet (500 mg total) by mouth 2 (two) times daily as needed for muscle spasms. 40 tablet 1  . metoprolol succinate (TOPROL-XL) 25 MG 24 hr tablet TAKE 1 TABLET BY MOUTH EVERY DAY 90 tablet 2  . spironolactone (ALDACTONE) 25 MG tablet TAKE 1/2 TABLET BY MOUTH DAILY 15 tablet 11  . warfarin (COUMADIN) 5 MG tablet TAKE AS DIRECTED BY ANTICOAGULATION CLINIC 40 tablet 5   No facility-administered medications prior to visit.     PAST MEDICAL HISTORY: Past Medical History:  Diagnosis Date  . Abnormal transaminases   . Acid reflux   . Bradycardia    nocturnal  . Colon polyps 02/07/2012   Dr. Carl Gessner  . HTN (hypertension) 06/02/2014  . Low back pain 06/15/2015  . Measles (roseola)   . Mitral regurgitation   . Mumps   . Neuropathy (  HCC) 01/10/2016  . NICM (nonischemic cardiomyopathy) (HCC)   . Persistent atrial fibrillation (HCC) 08/24/2010  . Preventative health care 12/04/2013   Colonsocopy May '13 - adenomatous polyps  Immunizations Tdap Jan '14   . Stroke (HCC) 08/24/2010  . Systolic CHF (HCC)   . Urinary hesitancy 12/21/2014  . Varicella   . Ventricular tachycardia, non-sustained (HCC)     PAST SURGICAL HISTORY: Past Surgical History:  Procedure Laterality Date  . CARDIAC CATHETERIZATION    . CARDIOVERSION  08/15/2012   Procedure: CARDIOVERSION;  Surgeon: Steven C Klein, MD;  Location: [facility];  Service: Cardiovascular;  Laterality: N/A;  . COLONOSCOPY W/ BIOPSIES  02/07/2012   Dr. Carl Gessner    FAMILY HISTORY: Family History  Problem Relation Age of Onset  . Alzheimer's disease Mother   . Lung cancer Brother     x 2, smokers  . Hypertension Brother   . Heart disease Brother   . Cancer Brother    . Lung cancer Brother   . Hypertension Brother   . Colon polyps Brother   . Hypertension Brother   . Hypertension Sister   . Neuropathy Sister   . Arthritis Sister   . Heart disease Father     CAD/MI  . Hypertension Father   . Mental illness Father     h/o depression requiring hospitalization  . Hyperlipidemia Sister     x 2  . Hypertension Sister   . Arthritis Sister   . Obesity Sister   . Birth defects Paternal Aunt     SOCIAL HISTORY: Social History   Social History  . Marital status: Married    Spouse name: Judith  . Number of children: 2  . Years of education: 16   Occupational History  . manager of shoe store   .  [employer]   Social History Main Topics  . Smoking status: Never Smoker  . Smokeless tobacco: Never Used  . Alcohol use 0.0 oz/week     Comment: occasional wine  . Drug use: No  . Sexual activity: Yes    Partners: Female     Comment: lives with wife, no dietary restrictions, except avoid dairy, uses Lactaid, works with [employer]   Other Topics Concern  . Not on file   Social History Narrative   Patient is married with 2 children. Store Manager [employer]   1 daughter attorney in [place]   Other daughter [employer] grad - pursuing PhD in [place]   Patient is right handed.   Patient has 16 years of education.   Patient drinks 1-2 cups daily.      PHYSICAL EXAM  Vitals:   11/16/16 0828  BP: 135/85  Pulse: 70  Weight: 227 lb 9.6 oz (103.2 kg)  Height: 6' 3" (1.905 m)   Body mass index is 28.45 kg/m.  Generalized: Well developed middle aged African-American male, in no acute distress   Neurological examination  Mentation: Alert oriented to time, place, history taking. Follows all commands speech and language fluent Cranial nerve II-XII: Pupils were equal round reactive to light. Extraocular movements were full, visual field were full on confrontational test. Facial sensation and strength were normal. Uvula tongue midline.  Head turning and shoulder shrug  were normal and symmetric. Motor: The motor testing reveals 5 over 5 strength of all 4 extremities. Good symmetric motor tone is noted throughout.  Sensory: Sensory testing is intact to soft touch on all 4 extremities. No evidence of extinction   is noted.  Coordination: Cerebellar testing reveals good finger-nose-finger and heel-to-shin bilaterally.  Gait and station: Gait is normal. Tandem gait is normal. Romberg is negative. No drift is seen.  Reflexes: Deep tendon reflexes are symmetric and normal bilaterally.   DIAGNOSTIC DATA (LABS, IMAGING, TESTING) - I reviewed patient records, labs, notes, testing and imaging myself where available.  Lab Results  Component Value Date   WBC 5.2 06/30/2016   HGB 13.6 06/30/2016   HCT 39.6 06/30/2016   MCV 92.4 06/30/2016   PLT 229.0 06/30/2016      Component Value Date/Time   NA 140 06/30/2016 1041   K 4.5 06/30/2016 1041   CL 105 06/30/2016 1041   CO2 31 06/30/2016 1041   GLUCOSE 92 06/30/2016 1041   BUN 15 06/30/2016 1041   CREATININE 0.95 06/30/2016 1041   CALCIUM 10.2 06/30/2016 1041   PROT 8.1 06/30/2016 1041   PROT 7.5 12/01/2014 0926   ALBUMIN 4.2 06/30/2016 1041   AST 22 06/30/2016 1041   ALT 17 06/30/2016 1041   ALKPHOS 65 06/30/2016 1041   BILITOT 1.2 06/30/2016 1041   GFRNONAA >60 10/28/2010 1047   GFRAA  10/28/2010 1047    >60        The eGFR has been calculated using the MDRD equation. This calculation has not been validated in all clinical situations. eGFR's persistently <60 mL/min signify possible Chronic Kidney Disease.   Lab Results  Component Value Date   CHOL 132 06/30/2016   HDL 59.50 06/30/2016   LDLCALC 61 06/30/2016   LDLDIRECT 110.4 10/03/2012   TRIG 62.0 06/30/2016   CHOLHDL 2 06/30/2016         ASSESSMENT AND PLAN 60 y.o. year old male  has a past medical history of Abnormal transaminases; Acid reflux; Bradycardia; Colon polyps (02/07/2012); HTN  (hypertension) (06/02/2014); Low back pain (06/15/2015); Measles (roseola); Mitral regurgitation; Mumps; Neuropathy (HCC) (01/10/2016); NICM (nonischemic cardiomyopathy) (HCC); Persistent atrial fibrillation (HCC) (08/24/2010); Preventative health care (12/04/2013); Stroke (HCC) (08/24/2010); Systolic CHF (HCC); Urinary hesitancy (12/21/2014); Varicella; and Ventricular tachycardia, non-sustained (HCC). here with:  1. History of Cardioembolic left MCA infarct in 2011 treated with tpa and mechanical embolectomy with excellent clinical recovery 2. Sensory neuropathy  I had a long d/w patient about his remote embolic stroke,atrial fibrillation risk for recurrent stroke/TIAs, personally independently reviewed imaging studies and stroke evaluation results and answered questions.Continue warfarin daily  for secondary stroke prevention and maintain strict control of hypertension with blood pressure goal below 130/90, diabetes with hemoglobin A1c goal below 6.5% and lipids with LDL cholesterol goal below 70 mg/dL. I also advised the patient to eat a healthy diet with plenty of whole grains, cereals, fruits and vegetables, exercise regularly and maintain ideal body weight check screening follow-up carotid ultrasound study. Continue gabapentin. in the current dose of 600 mg 3 times daily for his paresthesias and sensory neuropathy which appears to be stable. Greater than 50% time during this 25 minute visit was spent on counseling about his atrial fibrillation and recurrent stroke risk, paresthesias from peripheral neuropathy and answering questions No routine scheduled follow-up appointment with me is necessary but he may be referred back in the future by his primary physician as needed only.    Pramod [name], MD 11/16/2016, 9:04 AM [facility] [address], [address] [place], [place] [address] [phone]    

## 2016-11-16 NOTE — Patient Instructions (Signed)
I had a long d/w patient about his remote embolic stroke,atrial fibrillation risk for recurrent stroke/TIAs, personally independently reviewed imaging studies and stroke evaluation results and answered questions.Continue warfarin daily  for secondary stroke prevention and maintain strict control of hypertension with blood pressure goal below 130/90, diabetes with hemoglobin A1c goal below 6.5% and lipids with LDL cholesterol goal below 70 mg/dL. I also advised the patient to eat a healthy diet with plenty of whole grains, cereals, fruits and vegetables, exercise regularly and maintain ideal body weight check screening follow-up carotid ultrasound study. Continue gabapentin. in the current dose of 600 mg 3 times daily for his paresthesias and sensory neuropathy which appears to be stable. No routine scheduled follow-up appointment with me is necessary but he may be referred back in the future by his primary physician as needed only.

## 2016-11-23 ENCOUNTER — Ambulatory Visit: Payer: Managed Care, Other (non HMO)

## 2016-11-30 ENCOUNTER — Ambulatory Visit (INDEPENDENT_AMBULATORY_CARE_PROVIDER_SITE_OTHER): Payer: Managed Care, Other (non HMO) | Admitting: General Practice

## 2016-11-30 DIAGNOSIS — I639 Cerebral infarction, unspecified: Secondary | ICD-10-CM

## 2016-11-30 DIAGNOSIS — I4891 Unspecified atrial fibrillation: Secondary | ICD-10-CM | POA: Diagnosis not present

## 2016-11-30 DIAGNOSIS — Z7901 Long term (current) use of anticoagulants: Secondary | ICD-10-CM

## 2016-11-30 LAB — POCT INR: INR: 2.8

## 2016-11-30 NOTE — Patient Instructions (Signed)
Pre visit review using our clinic review tool, if applicable. No additional management support is needed unless otherwise documented below in the visit note. 

## 2016-11-30 NOTE — Progress Notes (Signed)
I have reviewed and agree with the plan. 

## 2016-12-07 ENCOUNTER — Other Ambulatory Visit: Payer: Managed Care, Other (non HMO)

## 2016-12-21 ENCOUNTER — Other Ambulatory Visit: Payer: Self-pay | Admitting: *Deleted

## 2016-12-21 ENCOUNTER — Other Ambulatory Visit: Payer: Managed Care, Other (non HMO)

## 2016-12-21 DIAGNOSIS — I6529 Occlusion and stenosis of unspecified carotid artery: Secondary | ICD-10-CM

## 2016-12-23 ENCOUNTER — Ambulatory Visit (HOSPITAL_COMMUNITY): Payer: Managed Care, Other (non HMO)

## 2016-12-29 ENCOUNTER — Ambulatory Visit: Payer: Managed Care, Other (non HMO) | Admitting: Family Medicine

## 2016-12-29 ENCOUNTER — Ambulatory Visit (INDEPENDENT_AMBULATORY_CARE_PROVIDER_SITE_OTHER): Payer: Managed Care, Other (non HMO) | Admitting: Family Medicine

## 2016-12-29 ENCOUNTER — Encounter: Payer: Self-pay | Admitting: Family Medicine

## 2016-12-29 ENCOUNTER — Ambulatory Visit (HOSPITAL_COMMUNITY)
Admission: RE | Admit: 2016-12-29 | Discharge: 2016-12-29 | Disposition: A | Discharge status: Home / Self Care | Payer: Managed Care, Other (non HMO) | Source: Ambulatory Visit | Attending: Neurology | Admitting: Neurology

## 2016-12-29 DIAGNOSIS — E559 Vitamin D deficiency, unspecified: Secondary | ICD-10-CM | POA: Diagnosis not present

## 2016-12-29 DIAGNOSIS — E782 Mixed hyperlipidemia: Secondary | ICD-10-CM

## 2016-12-29 DIAGNOSIS — I10 Essential (primary) hypertension: Secondary | ICD-10-CM | POA: Diagnosis not present

## 2016-12-29 DIAGNOSIS — I6529 Occlusion and stenosis of unspecified carotid artery: Secondary | ICD-10-CM | POA: Insufficient documentation

## 2016-12-29 DIAGNOSIS — G609 Hereditary and idiopathic neuropathy, unspecified: Secondary | ICD-10-CM | POA: Diagnosis not present

## 2016-12-29 DIAGNOSIS — Z7901 Long term (current) use of anticoagulants: Secondary | ICD-10-CM

## 2016-12-29 DIAGNOSIS — I4891 Unspecified atrial fibrillation: Secondary | ICD-10-CM

## 2016-12-29 LAB — VITAMIN D 25 HYDROXY (VIT D DEFICIENCY, FRACTURES): VITD: 59.38 ng/mL (ref 30.00–100.00)

## 2016-12-29 LAB — COMPREHENSIVE METABOLIC PANEL
ALBUMIN: 4.2 g/dL (ref 3.5–5.2)
ALT: 18 U/L (ref 0–53)
AST: 26 U/L (ref 0–37)
Alkaline Phosphatase: 67 U/L (ref 39–117)
BILIRUBIN TOTAL: 1.2 mg/dL (ref 0.2–1.2)
BUN: 15 mg/dL (ref 6–23)
CALCIUM: 9.8 mg/dL (ref 8.4–10.5)
CHLORIDE: 105 meq/L (ref 96–112)
CO2: 29 meq/L (ref 19–32)
Creatinine, Ser: 0.93 mg/dL (ref 0.40–1.50)
GFR: 106.81 mL/min (ref >60.00)
Glucose, Bld: 96 mg/dL (ref 70–99)
Potassium: 4.7 mEq/L (ref 3.5–5.1)
Sodium: 139 mEq/L (ref 135–145)
Total Protein: 7.9 g/dL (ref 6.0–8.3)

## 2016-12-29 LAB — LIPID PANEL
CHOLESTEROL: 138 mg/dL (ref 0–200)
HDL: 56 mg/dL (ref >39.00)
LDL CALC: 72 mg/dL (ref 0–99)
NonHDL: 82.06
TRIGLYCERIDES: 51 mg/dL (ref 0.0–149.0)
Total CHOL/HDL Ratio: 2
VLDL: 10.2 mg/dL (ref 0.0–40.0)

## 2016-12-29 LAB — TSH: TSH: 1.89 u[IU]/mL (ref 0.35–4.50)

## 2016-12-29 LAB — CBC
HCT: 39.3 % (ref 39.0–52.0)
HEMOGLOBIN: 13.6 g/dL (ref 13.0–17.0)
MCHC: 34.6 g/dL (ref 30.0–36.0)
MCV: 92.4 fl (ref 78.0–100.0)
PLATELETS: 205 10*3/uL (ref 150.0–400.0)
RBC: 4.25 Mil/uL (ref 4.22–5.81)
RDW: 14.1 % (ref 11.5–15.5)
WBC: 4.6 10*3/uL (ref 4.0–10.5)

## 2016-12-29 NOTE — Patient Instructions (Signed)
Peripheral Neuropathy Peripheral neuropathy is a type of nerve damage. It affects nerves that carry signals between the spinal cord and other parts of the body. These are called peripheral nerves. With peripheral neuropathy, one nerve or a group of nerves may be damaged. What are the causes? Many things can damage peripheral nerves. For some people with peripheral neuropathy, the cause is unknown. Some causes include:  Diabetes. This is the most common cause of peripheral neuropathy.  Injury to a nerve.  Pressure or stress on a nerve that lasts a long time.  Too little vitamin B. Alcoholism can lead to this.  Infections.  Autoimmune diseases, such as multiple sclerosis and systemic lupus erythematosus.  Inherited nerve diseases.  Some medicines, such as cancer drugs.  Toxic substances, such as lead and mercury.  Too little blood flowing to the legs.  Kidney disease.  Thyroid disease.  What are the signs or symptoms? Different people have different symptoms. The symptoms you have will depend on which of your nerves is damaged. Common symptoms include:  Loss of feeling (numbness) in the feet and hands.  Tingling in the feet and hands.  Pain that burns.  Very sensitive skin.  Weakness.  Not being able to move a part of the body (paralysis).  Muscle twitching.  Clumsiness or poor coordination.  Loss of balance.  Not being able to control your bladder.  Feeling dizzy.  Sexual problems.  How is this diagnosed? Peripheral neuropathy is a symptom, not a disease. Finding the cause of peripheral neuropathy can be hard. To figure that out, your health care provider will take a medical history and do a physical exam. A neurological exam will also be done. This involves checking things affected by your brain, spinal cord, and nerves (nervous system). For example, your health care provider will check your reflexes, how you move, and what you can feel. Other types of tests  may also be ordered, such as:  Blood tests.  A test of the fluid in your spinal cord.  Imaging tests, such as CT scans or an MRI.  Electromyography (EMG). This test checks the nerves that control muscles.  Nerve conduction velocity tests. These tests check how fast messages pass through your nerves.  Nerve biopsy. A small piece of nerve is removed. It is then checked under a microscope.  How is this treated?  Medicine is often used to treat peripheral neuropathy. Medicines may include: ? Pain-relieving medicines. Prescription or over-the-counter medicine may be suggested. ? Antiseizure medicine. This may be used for pain. ? Antidepressants. These also may help ease pain from neuropathy. ? Lidocaine. This is a numbing medicine. You might wear a patch or be given a shot. ? Mexiletine. This medicine is typically used to help control irregular heart rhythms.  Surgery. Surgery may be needed to relieve pressure on a nerve or to destroy a nerve that is causing pain.  Physical therapy to help movement.  Assistive devices to help movement. Follow these instructions at home:  Only take over-the-counter or prescription medicines as directed by your health care provider. Follow the instructions carefully for any given medicines. Do not take any other medicines without first getting approval from your health care provider.  If you have diabetes, work closely with your health care provider to keep your blood sugar under control.  If you have numbness in your feet: ? Check every day for signs of injury or infection. Watch for redness, warmth, and swelling. ? Wear padded socks and comfortable   shoes. These help protect your feet.  Do not do things that put pressure on your damaged nerve.  Do not smoke. Smoking keeps blood from getting to damaged nerves.  Avoid or limit alcohol. Too much alcohol can cause a lack of B vitamins. These vitamins are needed for healthy nerves.  Develop a good  support system. Coping with peripheral neuropathy can be stressful. Talk to a mental health specialist or join a support group if you are struggling.  Follow up with your health care provider as directed. Contact a health care provider if:  You have new signs or symptoms of peripheral neuropathy.  You are struggling emotionally from dealing with peripheral neuropathy.  You have a fever. Get help right away if:  You have an injury or infection that is not healing.  You feel very dizzy or begin vomiting.  You have chest pain.  You have trouble breathing. This information is not intended to replace advice given to you by your health care provider. Make sure you discuss any questions you have with your health care provider. Document Released: 08/26/2002 Document Revised: 02/11/2016 Document Reviewed: 05/13/2013 Elsevier Interactive Patient Education  2017 [employer]vier Inc.  

## 2016-12-29 NOTE — Assessment & Plan Note (Signed)
I have agreed to take over the Gabapentin prescription from his neurologist who no longer feels he needs to see him routinely due to stability of disease process

## 2016-12-29 NOTE — Assessment & Plan Note (Signed)
Check level today 

## 2016-12-29 NOTE — Assessment & Plan Note (Signed)
Well controlled, no changes to meds. Encouraged heart healthy diet such as the DASH diet and exercise as tolerated.  °

## 2016-12-29 NOTE — Assessment & Plan Note (Signed)
Tolerating statin, encouraged heart healthy diet, avoid trans fats, minimize simple carbs and saturated fats. Increase exercise as tolerated 

## 2016-12-29 NOTE — Progress Notes (Signed)
*  PRELIMINARY RESULTS* Vascular Ultrasound Carotid Duplex (Doppler) has been completed.  Preliminary findings: Bilateral: No significant ICA stenosis. Antegrade vertebral flow.   Landry Mellow, RDMS, RVT  12/29/2016, 3:11 PM

## 2016-12-29 NOTE — Progress Notes (Signed)
Pre visit review using our clinic review tool, if applicable. No additional management support is needed unless otherwise documented below in the visit note. 

## 2016-12-29 NOTE — Progress Notes (Signed)
Patient ID: Tyler Sims, male   DOB: Oct 09, 1955, 61 y.o.   MRN: [id number]   Subjective:  I acted as a Education administrator for Penni Homans, [place], [place]   Patient ID: Tyler Sims, male    DOB: 09-22-1955, 61 y.o.   MRN: [id number]  Chief Complaint  Patient presents with  . Follow-up    Afib F/U.     HPI  Patient is in today for a 67-month follow up for atrial fibrillation. Patient states that he feeling well and medication appears to be working well. Patient has a Hx of atrial fibrillation, HTN, neuropathy. Patient has no acute concerns noted at this time. No recent febrile illness or hospitalizations. Is trying to maintain a heart healthy diet and stay active.   Patient Care Team: Mosie Lukes, MD as PCP - General (Family Medicine) Gatha Mayer, MD as Consulting Physician (Gastroenterology) Garvin Fila, MD as Consulting Physician (Neurology)   Past Medical History:  Diagnosis Date  . Abnormal transaminases   . Acid reflux   . Bradycardia    nocturnal  . Colon polyps 02/07/2012   Dr. Silvano Rusk  . HTN (hypertension) 06/02/2014  . Low back pain 06/15/2015  . Measles ([place])   . Mitral regurgitation   . Mumps   . Neuropathy ([place]) 01/10/2016  . NICM (nonischemic cardiomyopathy) ([place])   . Persistent atrial fibrillation ([place]) 08/24/2010  . Preventative health care 12/04/2013   Colonsocopy May '13 - adenomatous polyps  Immunizations Tdap Jan '14   . Stroke ([place]) 08/24/2010  . Systolic CHF ([place])   . Urinary hesitancy 12/21/2014  . Varicella   . Ventricular tachycardia, non-sustained [facility])     Past Surgical History:  Procedure Laterality Date  . CARDIAC CATHETERIZATION    . CARDIOVERSION  08/15/2012   Procedure: CARDIOVERSION;  Surgeon: Deboraha Sprang, MD;  Location: [facility] ENDOSCOPY;  Service: Cardiovascular;  Laterality: N/A;  . COLONOSCOPY W/ BIOPSIES  02/07/2012   Dr. Silvano Rusk    Family History  Problem Relation Age of Onset  . Alzheimer's disease Mother   . Lung  cancer Brother     x 2, smokers  . Hypertension Brother   . Heart disease Brother   . Cancer Brother   . Lung cancer Brother   . Hypertension Brother   . Colon polyps Brother   . Hypertension Brother   . Hypertension Sister   . Neuropathy Sister   . Arthritis Sister   . Heart disease Father     CAD/MI  . Hypertension Father   . Mental illness Father     h/o depression requiring hospitalization  . Hyperlipidemia Sister     x 2  . Hypertension Sister   . Arthritis Sister   . Obesity Sister   . Birth defects Paternal Aunt     Social History   Social History  . Marital status: Married    Spouse name: Charlett Nose  . Number of children: 2  . Years of education: 16   Occupational History  . Nature conservation officer   .  [employer]   Social History Main Topics  . Smoking status: Never Smoker  . Smokeless tobacco: Never Used  . Alcohol use 0.0 oz/week     Comment: occasional wine  . Drug use: No  . Sexual activity: Yes    Partners: Female     Comment: lives with wife, no dietary restrictions, except avoid dairy, uses [employer], works with [employer]  Other Topics Concern  . Not on file   Social History Narrative   Patient is married with 2 children. Store Scientist, clinical (histocompatibility and immunogenetics) [employer]   1 daughter attorney in [place]   Other daughter [employer] grad - pursuing PhD in [place]   Patient is right handed.   Patient has 16 years of education.   Patient drinks 1-2 cups daily.    Outpatient Medications Prior to Visit  Medication Sig Dispense Refill  . atorvastatin (LIPITOR) 20 MG tablet TAKE 1 TABLET BY MOUTH ONCE DAILY AT 6PM 90 tablet 0  . cholecalciferol (VITAMIN D) 1000 units tablet Take 1 tablet (1,000 Units total) by mouth daily.    . COD LIVER OIL PO Take 1 capsule by mouth daily.     . Coenzyme Q10 (CO Q-10) 200 MG CAPS Take 200 mg by mouth daily.     Marland Kitchen gabapentin (NEURONTIN) 600 MG tablet TAKE 1 TABLET BY MOUTH 3 TIMES A DAY 270 tablet 1  . losartan (COZAAR) 25 MG tablet  TAKE 1 TABLET BY MOUTH EVERY DAY 30 tablet 11  . methocarbamol (ROBAXIN) 500 MG tablet Take 1 tablet (500 mg total) by mouth 2 (two) times daily as needed for muscle spasms. 40 tablet 1  . metoprolol succinate (TOPROL-XL) 25 MG 24 hr tablet TAKE 1 TABLET BY MOUTH EVERY DAY 90 tablet 2  . spironolactone (ALDACTONE) 25 MG tablet TAKE 1/2 TABLET BY MOUTH DAILY 15 tablet 11  . warfarin (COUMADIN) 5 MG tablet TAKE AS DIRECTED BY ANTICOAGULATION CLINIC 40 tablet 5  . Diclofenac Sodium (PENNSAID) 2 % SOLN Place 2 application onto the skin 2 (two) times daily. (Patient not taking: Reported on 12/29/2016) 112 g 3   No facility-administered medications prior to visit.     Allergies  Allergen Reactions  . Lisinopril Rash  . Simvastatin Rash    Review of Systems  Constitutional: Negative for fever and malaise/fatigue.  HENT: Negative for congestion.   Eyes: Negative for blurred vision.  Respiratory: Negative for shortness of breath.   Cardiovascular: Negative for chest pain, palpitations and leg swelling.  Gastrointestinal: Negative for abdominal pain, blood in stool and nausea.  Genitourinary: Negative for dysuria and frequency.  Musculoskeletal: Negative for falls.  Skin: Negative for rash.  Neurological: Negative for dizziness, loss of consciousness and headaches.  Endo/Heme/Allergies: Negative for environmental allergies.  Psychiatric/Behavioral: Negative for depression. The patient is not nervous/anxious.        Objective:    Physical Exam  Constitutional: He is oriented to person, place, and time. He appears well-developed and well-nourished. No distress.  HENT:  Head: Normocephalic and atraumatic.  Nose: Nose normal.  Eyes: Right eye exhibits no discharge. Left eye exhibits no discharge.  Neck: Normal range of motion. Neck supple.  Cardiovascular: Normal rate and regular rhythm.   No murmur heard. Pulmonary/Chest: Effort normal and breath sounds normal.  Abdominal: Soft. Bowel  sounds are normal. There is no tenderness.  Musculoskeletal: He exhibits no edema.  Neurological: He is alert and oriented to person, place, and time.  Skin: Skin is warm and dry.  Psychiatric: He has a normal mood and affect.  Nursing note and vitals reviewed.   There were no vitals taken for this visit. Wt Readings from Last 3 Encounters:  11/16/16 227 lb 9.6 oz (103.2 kg)  07/20/16 219 lb (99.3 kg)  06/30/16 217 lb 8 oz (98.7 kg)   BP Readings from Last 3 Encounters:  11/16/16 135/85  07/20/16 122/82  06/30/16 130/76  Immunization History  Administered Date(s) Administered  . Influenza Split 06/05/2012  . Influenza,inj,Quad PF,36+ Mos 05/28/2013, 05/30/2014, 06/15/2015, 05/25/2016  . Tdap 10/03/2012    Health Maintenance  Topic Date Due  . Hepatitis C Screening  [birth year]  . HIV Screening  08/15/1972  . INFLUENZA VACCINE  04/19/2017  . COLONOSCOPY  06/21/2020  . TETANUS/TDAP  10/03/2022    Lab Results  Component Value Date   WBC 5.2 06/30/2016   HGB 13.6 06/30/2016   HCT 39.6 06/30/2016   PLT 229.0 06/30/2016   GLUCOSE 92 06/30/2016   CHOL 132 06/30/2016   TRIG 62.0 06/30/2016   HDL 59.50 06/30/2016   LDLDIRECT 110.4 10/03/2012   LDLCALC 61 06/30/2016   ALT 17 06/30/2016   AST 22 06/30/2016   NA 140 06/30/2016   K 4.5 06/30/2016   CL 105 06/30/2016   CREATININE 0.95 06/30/2016   BUN 15 06/30/2016   CO2 31 06/30/2016   TSH 0.98 06/30/2016   PSA 0.95 12/09/2014   INR 2.8 11/30/2016   HGBA1C  08/28/2010    4.7 (NOTE)                                                                       According to the ADA Clinical Practice Recommendations for 2011, when HbA1c is used as a screening test:   >=6.5%   Diagnostic of Diabetes Mellitus           (if abnormal result  is confirmed)  5.7-6.4%   Increased risk of developing Diabetes Mellitus  References:Diagnosis and Classification of Diabetes Mellitus,Diabetes MIWO,0321,22(QMGNO 1):S62-S69 and Standards of  Medical Care in         Diabetes - 2011,Diabetes Care,2011,34  (Suppl 1):S11-S61.    Lab Results  Component Value Date   TSH 0.98 06/30/2016   Lab Results  Component Value Date   WBC 5.2 06/30/2016   HGB 13.6 06/30/2016   HCT 39.6 06/30/2016   MCV 92.4 06/30/2016   PLT 229.0 06/30/2016   Lab Results  Component Value Date   NA 140 06/30/2016   K 4.5 06/30/2016   CO2 31 06/30/2016   GLUCOSE 92 06/30/2016   BUN 15 06/30/2016   CREATININE 0.95 06/30/2016   BILITOT 1.2 06/30/2016   ALKPHOS 65 06/30/2016   AST 22 06/30/2016   ALT 17 06/30/2016   PROT 8.1 06/30/2016   ALBUMIN 4.2 06/30/2016   CALCIUM 10.2 06/30/2016   GFR 104.40 06/30/2016   Lab Results  Component Value Date   CHOL 132 06/30/2016   Lab Results  Component Value Date   HDL 59.50 06/30/2016   Lab Results  Component Value Date   LDLCALC 61 06/30/2016   Lab Results  Component Value Date   TRIG 62.0 06/30/2016   Lab Results  Component Value Date   CHOLHDL 2 06/30/2016   Lab Results  Component Value Date   HGBA1C  08/28/2010    4.7 (NOTE)  According to the ADA Clinical Practice Recommendations for 2011, when HbA1c is used as a screening test:   >=6.5%   Diagnostic of Diabetes Mellitus           (if abnormal result  is confirmed)  5.7-6.4%   Increased risk of developing Diabetes Mellitus  References:Diagnosis and Classification of Diabetes Mellitus,Diabetes OEUM,3536,14(ERXVQ 1):S62-S69 and Standards of Medical Care in         Diabetes - 2011,Diabetes MGQQ,7619,50  (Suppl 1):S11-S61.         Assessment & Plan:   Problem List Items Addressed This Visit    None      I have discontinued Mr. Tandy's Diclofenac Sodium. I am also having him maintain his COD LIVER OIL PO, Co Q-10, methocarbamol, metoprolol succinate, spironolactone, losartan, cholecalciferol, warfarin, gabapentin, and atorvastatin.  No orders of the defined types  were placed in this encounter.   CMA served as Education administrator during this visit. History, Physical and Plan performed by medical provider. Documentation and orders reviewed and attested to.  Shan Levans, CMA

## 2016-12-29 NOTE — Assessment & Plan Note (Signed)
Follow with Coumadin clinic

## 2016-12-29 NOTE — Assessment & Plan Note (Signed)
Follows with cardiology Dr Caryl Comes, doing well

## 2016-12-30 ENCOUNTER — Other Ambulatory Visit: Payer: Self-pay | Admitting: Family Medicine

## 2016-12-30 LAB — VAS US CAROTID
LCCADDIAS: -26 cm/s
LCCAPDIAS: 23 cm/s
LCCAPSYS: 109 cm/s
LEFT ECA DIAS: -21 cm/s
LEFT VERTEBRAL DIAS: 14 cm/s
LICADSYS: -100 cm/s
LICAPSYS: -87 cm/s
Left CCA dist sys: -104 cm/s
Left ICA dist dias: -34 cm/s
Left ICA prox dias: -27 cm/s
RCCADSYS: -91 cm/s
RCCAPSYS: 99 cm/s
RIGHT ECA DIAS: -21 cm/s
RIGHT VERTEBRAL DIAS: 14 cm/s
Right CCA prox dias: 24 cm/s

## 2017-01-04 ENCOUNTER — Telehealth: Payer: Self-pay

## 2017-01-04 NOTE — Telephone Encounter (Signed)
Rn call patient to let him know the carotid doppler was normal. Pt stated he saw it on mychart of the normal results. Pt verbalized understanding.

## 2017-01-04 NOTE — Telephone Encounter (Signed)
-----   Message from Garvin Fila, MD sent at 12/30/2016  3:24 PM EDT ----- Tyler Sims inform the patient that carotid Doppler study was normal

## 2017-01-09 ENCOUNTER — Other Ambulatory Visit: Payer: Self-pay | Admitting: Family Medicine

## 2017-01-11 ENCOUNTER — Ambulatory Visit (INDEPENDENT_AMBULATORY_CARE_PROVIDER_SITE_OTHER): Payer: Managed Care, Other (non HMO) | Admitting: General Practice

## 2017-01-11 DIAGNOSIS — I4891 Unspecified atrial fibrillation: Secondary | ICD-10-CM

## 2017-01-11 DIAGNOSIS — Z7901 Long term (current) use of anticoagulants: Secondary | ICD-10-CM

## 2017-01-11 LAB — POCT INR: INR: 2.2

## 2017-01-11 NOTE — Patient Instructions (Signed)
Pre visit review using our clinic review tool, if applicable. No additional management support is needed unless otherwise documented below in the visit note. 

## 2017-01-11 NOTE — Progress Notes (Signed)
I have reviewed and agree with the plan. 

## 2017-02-01 ENCOUNTER — Other Ambulatory Visit: Payer: Self-pay | Admitting: Family Medicine

## 2017-02-02 ENCOUNTER — Other Ambulatory Visit: Payer: Self-pay | Admitting: General Practice

## 2017-02-02 MED ORDER — WARFARIN SODIUM 5 MG PO TABS: ORAL_TABLET | ORAL | 5 refills | Status: DC

## 2017-02-22 ENCOUNTER — Ambulatory Visit (INDEPENDENT_AMBULATORY_CARE_PROVIDER_SITE_OTHER): Payer: Managed Care, Other (non HMO) | Admitting: General Practice

## 2017-02-22 DIAGNOSIS — I4891 Unspecified atrial fibrillation: Secondary | ICD-10-CM

## 2017-02-22 LAB — POCT INR: INR: 2.1

## 2017-02-22 NOTE — Patient Instructions (Signed)
Pre visit review using our clinic review tool, if applicable. No additional management support is needed unless otherwise documented below in the visit note. 

## 2017-02-22 NOTE — Progress Notes (Signed)
I have reviewed and agree with the plan. 

## 2017-03-12 ENCOUNTER — Other Ambulatory Visit: Payer: Self-pay | Admitting: Internal Medicine

## 2017-03-17 ENCOUNTER — Other Ambulatory Visit: Payer: Self-pay | Admitting: Internal Medicine

## 2017-03-17 MED ORDER — LOSARTAN POTASSIUM 25 MG PO TABS: 25.0000 mg | ORAL_TABLET | Freq: Every day | ORAL | 0 refills | Status: DC

## 2017-03-17 MED ORDER — SPIRONOLACTONE 25 MG PO TABS
12.5000 mg | ORAL_TABLET | Freq: Every day | ORAL | 0 refills | Status: DC
Start: 2017-03-17 — End: 2017-07-17

## 2017-03-17 NOTE — Addendum Note (Signed)
Addended by: Derl Barrow on: 03/17/2017 08:40 AM   Modules accepted: Orders

## 2017-03-21 ENCOUNTER — Telehealth: Payer: Self-pay | Admitting: *Deleted

## 2017-03-21 ENCOUNTER — Other Ambulatory Visit: Payer: Self-pay | Admitting: General Practice

## 2017-03-21 MED ORDER — METOPROLOL SUCCINATE ER 25 MG PO TB24: 25.0000 mg | ORAL_TABLET | Freq: Every day | ORAL | 0 refills | Status: DC

## 2017-03-21 MED ORDER — WARFARIN SODIUM 5 MG PO TABS: ORAL_TABLET | ORAL | 1 refills | Status: DC

## 2017-03-21 MED ORDER — ATORVASTATIN CALCIUM 20 MG PO TABS: ORAL_TABLET | ORAL | 0 refills | Status: DC

## 2017-03-21 MED ORDER — GABAPENTIN 600 MG PO TABS: 600.0000 mg | ORAL_TABLET | Freq: Three times a day (TID) | ORAL | 0 refills | Status: DC

## 2017-03-21 NOTE — Telephone Encounter (Signed)
[facility] requesting [facility] 90 day supply for coumadin.  Since you do his coumadin would you like to send in the refill?

## 2017-03-21 NOTE — Telephone Encounter (Signed)
Yes,  I prefer to do re-fills for my coumadin patient's.  Thanks!

## 2017-03-21 NOTE — Telephone Encounter (Signed)
Faxed refill request to [facility]

## 2017-03-28 ENCOUNTER — Other Ambulatory Visit: Payer: Self-pay | Admitting: Family Medicine

## 2017-04-05 ENCOUNTER — Ambulatory Visit: Payer: Managed Care, Other (non HMO)

## 2017-04-07 ENCOUNTER — Ambulatory Visit (INDEPENDENT_AMBULATORY_CARE_PROVIDER_SITE_OTHER): Payer: Managed Care, Other (non HMO) | Admitting: General Practice

## 2017-04-07 ENCOUNTER — Ambulatory Visit: Payer: Managed Care, Other (non HMO)

## 2017-04-07 DIAGNOSIS — I4891 Unspecified atrial fibrillation: Secondary | ICD-10-CM | POA: Diagnosis not present

## 2017-04-07 LAB — POCT INR: INR: 2.8

## 2017-04-07 NOTE — Patient Instructions (Signed)
Pre visit review using our clinic review tool, if applicable. No additional management support is needed unless otherwise documented below in the visit note. 

## 2017-04-07 NOTE — Progress Notes (Signed)
I have reviewed and agree with the plan. 

## 2017-05-02 ENCOUNTER — Encounter: Payer: Self-pay | Admitting: Internal Medicine

## 2017-05-02 ENCOUNTER — Ambulatory Visit (INDEPENDENT_AMBULATORY_CARE_PROVIDER_SITE_OTHER): Payer: Managed Care, Other (non HMO) | Admitting: Internal Medicine

## 2017-05-02 VITALS — BP 146/86 | HR 68 | Ht 75.0 in | Wt 223.0 lb

## 2017-05-02 DIAGNOSIS — I480 Paroxysmal atrial fibrillation: Secondary | ICD-10-CM

## 2017-05-02 DIAGNOSIS — I428 Other cardiomyopathies: Secondary | ICD-10-CM | POA: Diagnosis not present

## 2017-05-02 DIAGNOSIS — R001 Bradycardia, unspecified: Secondary | ICD-10-CM

## 2017-05-02 NOTE — Progress Notes (Signed)
Patient Care Team: Mosie Lukes, MD as PCP - General (Family Medicine) Gatha Mayer, MD as Consulting Physician (Gastroenterology) Garvin Fila, MD as Consulting Physician (Neurology)   HPI  Tyler Sims is a 61 y.o. male Seen in followup for nonischemic cardiomyopathy with which he presented with a stroke and atrial fibrillation.  He is on Coumadin anticoagulation   It was initially hoped that this was secondary to the rate. However, despite restoration of sinus rhythm, he has had persistent left ventricular dysfunction.  Ejection fraction remain   low   Echo  January 2014 EF improved to 40-45% range  he remains on Aldactone. Potassium was 4.43/16   He also has significant fatigue this seems to be aggravated by long work hours.  . He notes that his heart rate runs in the   50s. He says his wife does not complaining much of his snoring.   This continues to be an issue. He has occasional   exercise associated chest heaviness. He has known normal catheterization 2012 which I reviewed today.   He denies edema palpitations nocturnal dyspnea or orthopnea  DATE TEST    1/12    Echo   EF 20-25 %   1/14    Echo   EF 40-45 %   8/17 Echo EF 45-50        Past Medical History:  Diagnosis Date  . Abnormal transaminases   . Acid reflux   . Bradycardia    nocturnal  . Colon polyps 02/07/2012   Dr. Silvano Rusk  . HTN (hypertension) 06/02/2014  . Low back pain 06/15/2015  . Measles ([place])   . Mitral regurgitation   . Mumps   . Neuropathy 01/10/2016  . NICM (nonischemic cardiomyopathy) ([place])   . Persistent atrial fibrillation ([place]) 08/24/2010  . Preventative health care 12/04/2013   Colonsocopy May '13 - adenomatous polyps  Immunizations Tdap Jan '14   . Stroke ([place]) 08/24/2010  . Systolic CHF ([place])   . Urinary hesitancy 12/21/2014  . Varicella   . Ventricular tachycardia, non-sustained [facility])     Past Surgical History:  Procedure Laterality Date  . CARDIAC CATHETERIZATION     . CARDIOVERSION  08/15/2012   Procedure: CARDIOVERSION;  Surgeon: Deboraha Sprang, MD;  Location: [facility];  Service: Cardiovascular;  Laterality: N/A;  . COLONOSCOPY W/ BIOPSIES  02/07/2012   Dr. Silvano Rusk    Current Outpatient Prescriptions  Medication Sig Dispense Refill  . atorvastatin (LIPITOR) 20 MG tablet TAKE 1 TABLET BY MOUTH ONCE DAILY AT 6PM 90 tablet 0  . cholecalciferol (VITAMIN D) 1000 units tablet Take 1 tablet (1,000 Units total) by mouth daily.    . COD LIVER OIL PO Take 1 capsule by mouth daily.     . Coenzyme Q10 (CO Q-10) 200 MG CAPS Take 200 mg by mouth daily.     Marland Kitchen gabapentin (NEURONTIN) 600 MG tablet Take 1 tablet (600 mg total) by mouth 3 (three) times daily. 270 tablet 0  . losartan (COZAAR) 25 MG tablet Take 1 tablet (25 mg total) by mouth daily. 90 tablet 0  . methocarbamol (ROBAXIN) 500 MG tablet Take 1 tablet (500 mg total) by mouth 2 (two) times daily as needed for muscle spasms. 40 tablet 1  . metoprolol succinate (TOPROL-XL) 25 MG 24 hr tablet Take 1 tablet (25 mg total) by mouth daily. 90 tablet 0  . spironolactone (ALDACTONE) 25 MG tablet Take 0.5 tablets (12.5 mg total) by mouth daily. 45 tablet  0  . warfarin (COUMADIN) 5 MG tablet Take 1 1/2 tablets daily except 1 tablet on Mon/Wed/Fridays OR AS DIRECTED BY ANTICOAGULATION CLINIC 120 tablet 1   No current facility-administered medications for this visit.     Allergies  Allergen Reactions  . Lisinopril Rash  . Simvastatin Rash    Review of Systems negative except from HPI and PMH  Physical Exam BP (!) 146/86   Pulse 68   Ht 6\' 3"  (1.905 m)   Wt 223 lb (101.2 kg)   SpO2 98%   BMI 27.87 kg/m  Well developed and well nourished in no acute distress HENT normal E scleral and icterus clear Neck Supple JVP flat; carotids brisk and full Clear to ausculation  Regular rate and rhythm, no murmurs gallops or rub Soft with active bowel sounds No clubbing cyanosis none Edema Alert and  oriented, grossly normal motor and sensory function Skin Warm and Dry  Electrocardiogram sinus@ 62 18/09/42  Assessment and  Plan Paroxysmal atrial fibrillation  Sinus bradycardia  Nonischemic cardiomyopathy  On Anticoagulation;  No bleeding issues   Euvolemic continue current meds  BP  Mildly elevated but 130 at home

## 2017-05-02 NOTE — Patient Instructions (Signed)
Medication Instructions: - Your physician recommends that you continue on your current medications as directed. Please refer to the Current Medication list given to you today.  Labwork: - none ordered  Procedures/Testing: - none ordered  Follow-Up: - Your physician wants you to follow-up in: 1 year with Dr. Caryl Comes. You will receive a reminder letter in the mail two months in advance. If you don't receive a letter, please call our office to schedule the follow-up appointment.  Any Additional Special Instructions Will Be Listed Below (If Applicable).     If you need a refill on your cardiac medications before your next appointment, please call your pharmacy.  s

## 2017-05-16 NOTE — Patient Instructions (Signed)
Pre visit review using our clinic review tool, if applicable. No additional management support is needed unless otherwise documented below in the visit note. 

## 2017-05-17 ENCOUNTER — Ambulatory Visit (INDEPENDENT_AMBULATORY_CARE_PROVIDER_SITE_OTHER): Payer: Managed Care, Other (non HMO) | Admitting: General Practice

## 2017-05-17 DIAGNOSIS — Z23 Encounter for immunization: Secondary | ICD-10-CM | POA: Diagnosis not present

## 2017-05-17 LAB — POCT INR: INR: 2.1

## 2017-05-17 NOTE — Progress Notes (Signed)
I have reviewed and agree with the plan. 

## 2017-06-02 ENCOUNTER — Other Ambulatory Visit: Payer: Self-pay | Admitting: Family Medicine

## 2017-06-08 ENCOUNTER — Other Ambulatory Visit: Payer: Self-pay | Admitting: Internal Medicine

## 2017-06-22 ENCOUNTER — Other Ambulatory Visit: Payer: Self-pay | Admitting: Family Medicine

## 2017-06-23 ENCOUNTER — Other Ambulatory Visit: Payer: Self-pay | Admitting: Family Medicine

## 2017-06-23 ENCOUNTER — Other Ambulatory Visit: Payer: Self-pay | Admitting: Emergency Medicine

## 2017-06-23 MED ORDER — GABAPENTIN 600 MG PO TABS: 600.0000 mg | ORAL_TABLET | Freq: Three times a day (TID) | ORAL | 0 refills | Status: DC

## 2017-06-23 NOTE — Telephone Encounter (Signed)
This was already faxed in today/thx dmf

## 2017-06-28 ENCOUNTER — Ambulatory Visit (INDEPENDENT_AMBULATORY_CARE_PROVIDER_SITE_OTHER): Payer: Managed Care, Other (non HMO) | Admitting: General Practice

## 2017-06-28 DIAGNOSIS — Z7901 Long term (current) use of anticoagulants: Secondary | ICD-10-CM | POA: Insufficient documentation

## 2017-06-28 DIAGNOSIS — I4891 Unspecified atrial fibrillation: Secondary | ICD-10-CM

## 2017-06-28 LAB — POCT INR: INR: 2.5

## 2017-06-28 NOTE — Telephone Encounter (Signed)
Spoke with Bill at [facility] and confirmed that Rx was received and is on hold. He will get it ready for pt. Notified pt.

## 2017-06-28 NOTE — Telephone Encounter (Signed)
Tyler Sims Self [phone]  [facility] [address]  gabapentin (NEURONTIN) 600 MG tablet  Patient called and said that the pharmacy has not received the refill request.

## 2017-06-28 NOTE — Patient Instructions (Signed)
Pre visit review using our clinic review tool, if applicable. No additional management support is needed unless otherwise documented below in the visit note. 

## 2017-06-28 NOTE — Progress Notes (Signed)
I have reviewed and agree with the plan. 

## 2017-07-06 ENCOUNTER — Ambulatory Visit (INDEPENDENT_AMBULATORY_CARE_PROVIDER_SITE_OTHER): Payer: Managed Care, Other (non HMO) | Admitting: Family Medicine

## 2017-07-06 ENCOUNTER — Encounter: Payer: Self-pay | Admitting: Family Medicine

## 2017-07-06 VITALS — BP 124/68 | HR 62 | Temp 98.4°F | Resp 18 | Ht 75.0 in | Wt 221.0 lb

## 2017-07-06 DIAGNOSIS — R700 Elevated erythrocyte sedimentation rate: Secondary | ICD-10-CM | POA: Insufficient documentation

## 2017-07-06 DIAGNOSIS — I10 Essential (primary) hypertension: Secondary | ICD-10-CM | POA: Diagnosis not present

## 2017-07-06 DIAGNOSIS — E782 Mixed hyperlipidemia: Secondary | ICD-10-CM | POA: Diagnosis not present

## 2017-07-06 DIAGNOSIS — Z7289 Other problems related to lifestyle: Secondary | ICD-10-CM

## 2017-07-06 DIAGNOSIS — I4891 Unspecified atrial fibrillation: Secondary | ICD-10-CM | POA: Diagnosis not present

## 2017-07-06 DIAGNOSIS — E559 Vitamin D deficiency, unspecified: Secondary | ICD-10-CM

## 2017-07-06 DIAGNOSIS — G629 Polyneuropathy, unspecified: Secondary | ICD-10-CM

## 2017-07-06 DIAGNOSIS — Z0000 Encounter for general adult medical examination without abnormal findings: Secondary | ICD-10-CM

## 2017-07-06 DIAGNOSIS — R3911 Hesitancy of micturition: Secondary | ICD-10-CM

## 2017-07-06 HISTORY — DX: Elevated erythrocyte sedimentation rate: R70.0

## 2017-07-06 LAB — COMPREHENSIVE METABOLIC PANEL
ALT: 18 U/L (ref 0–53)
AST: 20 U/L (ref 0–37)
Albumin: 4.2 g/dL (ref 3.5–5.2)
Alkaline Phosphatase: 76 U/L (ref 39–117)
BUN: 13 mg/dL (ref 6–23)
CO2: 25 meq/L (ref 19–32)
Calcium: 9.6 mg/dL (ref 8.4–10.5)
Chloride: 105 mEq/L (ref 96–112)
Creatinine, Ser: 0.92 mg/dL (ref 0.40–1.50)
GFR: 107.97 mL/min (ref >60.00)
GLUCOSE: 99 mg/dL (ref 70–99)
POTASSIUM: 4.3 meq/L (ref 3.5–5.1)
Sodium: 141 mEq/L (ref 135–145)
Total Bilirubin: 1.1 mg/dL (ref 0.2–1.2)
Total Protein: 7.4 g/dL (ref 6.0–8.3)

## 2017-07-06 LAB — CBC WITH DIFFERENTIAL/PLATELET
Basophils Absolute: 0 10*3/uL (ref 0.0–0.1)
Basophils Relative: 0.6 % (ref 0.0–3.0)
EOS PCT: 0.7 % (ref 0.0–5.0)
Eosinophils Absolute: 0 10*3/uL (ref 0.0–0.7)
HCT: 40.9 % (ref 39.0–52.0)
Hemoglobin: 13.8 g/dL (ref 13.0–17.0)
LYMPHS ABS: 1.2 10*3/uL (ref 0.7–4.0)
Lymphocytes Relative: 21.3 % (ref 12.0–46.0)
MCHC: 33.7 g/dL (ref 30.0–36.0)
MCV: 95.7 fl (ref 78.0–100.0)
MONOS PCT: 7.9 % (ref 3.0–12.0)
Monocytes Absolute: 0.4 10*3/uL (ref 0.1–1.0)
NEUTROS ABS: 3.9 10*3/uL (ref 1.4–7.7)
NEUTROS PCT: 69.5 % (ref 43.0–77.0)
PLATELETS: 189 10*3/uL (ref 150.0–400.0)
RBC: 4.28 Mil/uL (ref 4.22–5.81)
RDW: 13.8 % (ref 11.5–15.5)
WBC: 5.6 10*3/uL (ref 4.0–10.5)

## 2017-07-06 LAB — LIPID PANEL
CHOL/HDL RATIO: 2
Cholesterol: 138 mg/dL (ref 0–200)
HDL: 58 mg/dL (ref >39.00)
LDL Cholesterol: 68 mg/dL (ref 0–99)
NONHDL: 80.15
Triglycerides: 62 mg/dL (ref 0.0–149.0)
VLDL: 12.4 mg/dL (ref 0.0–40.0)

## 2017-07-06 LAB — TSH: TSH: 1.81 u[IU]/mL (ref 0.35–4.50)

## 2017-07-06 LAB — SEDIMENTATION RATE: Sed Rate: 14 mm/hr (ref 0–20)

## 2017-07-06 LAB — VITAMIN D 25 HYDROXY (VIT D DEFICIENCY, FRACTURES): VITD: 32.22 ng/mL (ref 30.00–100.00)

## 2017-07-06 LAB — FOLATE: Folate: 9.8 ng/mL (ref >5.9)

## 2017-07-06 LAB — PSA: PSA: 1.68 ng/mL (ref 0.10–4.00)

## 2017-07-06 LAB — VITAMIN B12: VITAMIN B 12: 538 pg/mL (ref 211–911)

## 2017-07-06 MED ORDER — GABAPENTIN 600 MG PO TABS: 600.0000 mg | ORAL_TABLET | Freq: Four times a day (QID) | ORAL | 0 refills | Status: DC

## 2017-07-06 NOTE — Assessment & Plan Note (Signed)
Tolerating Coumadin, rate controlled 

## 2017-07-06 NOTE — Assessment & Plan Note (Signed)
Patient encouraged to maintain heart healthy diet, regular exercise, adequate sleep. Consider daily probiotics. Take medications as prescribed. Patient encouraged to maintain heart healthy diet, regular exercise, adequate sleep. Consider daily probiotics. Take medications as prescribed

## 2017-07-06 NOTE — Assessment & Plan Note (Signed)
Well controlled, no changes to meds. Encouraged heart healthy diet such as the DASH diet and exercise as tolerated.  °

## 2017-07-06 NOTE — Assessment & Plan Note (Signed)
Check PSA. ?

## 2017-07-06 NOTE — Patient Instructions (Signed)
Tylenol/Acetaminophen ES is 500 mg take one each am then can try taking Tylenol PM 1-2 tabs each evening for pain and sleep  Max of 3000 mg in 24 hours of Tylenol/Acetaminophen  Preventive Care 40-64 Years, Male Preventive care refers to lifestyle choices and visits with your health care provider that can promote health and wellness. What does preventive care include?  A yearly physical exam. This is also called an annual well check.  Dental exams once or twice a year.  Routine eye exams. Ask your health care provider how often you should have your eyes checked.  Personal lifestyle choices, including: ? Daily care of your teeth and gums. ? Regular physical activity. ? Eating a healthy diet. ? Avoiding tobacco and drug use. ? Limiting alcohol use. ? Practicing safe sex. ? Taking low-dose aspirin every day starting at age 34. What happens during an annual well check? The services and screenings done by your health care provider during your annual well check will depend on your age, overall health, lifestyle risk factors, and family history of disease. Counseling Your health care provider may ask you questions about your:  Alcohol use.  Tobacco use.  Drug use.  Emotional well-being.  Home and relationship well-being.  Sexual activity.  Eating habits.  Work and work Statistician.  Screening You may have the following tests or measurements:  Height, weight, and BMI.  Blood pressure.  Lipid and cholesterol levels. These may be checked every 5 years, or more frequently if you are over 31 years old.  Skin check.  Lung cancer screening. You may have this screening every year starting at age 38 if you have a 30-pack-year history of smoking and currently smoke or have quit within the past 15 years.  Fecal occult blood test (FOBT) of the stool. You may have this test every year starting at age 101.  Flexible sigmoidoscopy or colonoscopy. You may have a sigmoidoscopy every 5  years or a colonoscopy every 10 years starting at age 52.  Prostate cancer screening. Recommendations will vary depending on your family history and other risks.  Hepatitis C blood test.  Hepatitis B blood test.  Sexually transmitted disease (STD) testing.  Diabetes screening. This is done by checking your blood sugar (glucose) after you have not eaten for a while (fasting). You may have this done every 1-3 years.  Discuss your test results, treatment options, and if necessary, the need for more tests with your health care provider. Vaccines Your health care provider may recommend certain vaccines, such as:  Influenza vaccine. This is recommended every year.  Tetanus, diphtheria, and acellular pertussis (Tdap, Td) vaccine. You may need a Td booster every 10 years.  Varicella vaccine. You may need this if you have not been vaccinated.  Zoster vaccine. You may need this after age 52.  Measles, mumps, and rubella (MMR) vaccine. You may need at least one dose of MMR if you were born in 1957 or later. You may also need a second dose.  Pneumococcal 13-valent conjugate (PCV13) vaccine. You may need this if you have certain conditions and have not been vaccinated.  Pneumococcal polysaccharide (PPSV23) vaccine. You may need one or two doses if you smoke cigarettes or if you have certain conditions.  Meningococcal vaccine. You may need this if you have certain conditions.  Hepatitis A vaccine. You may need this if you have certain conditions or if you travel or work in places where you may be exposed to hepatitis A.  Hepatitis  B vaccine. You may need this if you have certain conditions or if you travel or work in places where you may be exposed to hepatitis B.  Haemophilus influenzae type b (Hib) vaccine. You may need this if you have certain risk factors.  Talk to your health care provider about which screenings and vaccines you need and how often you need them. This information is not  intended to replace advice given to you by your health care provider. Make sure you discuss any questions you have with your health care provider. Document Released: 10/02/2015 Document Revised: 05/25/2016 Document Reviewed: 07/07/2015 Elsevier Interactive Patient Education  2017 [employer].

## 2017-07-06 NOTE — Assessment & Plan Note (Signed)
Tolerating statin, encouraged heart healthy diet, avoid trans fats, minimize simple carbs and saturated fats. Increase exercise as tolerated 

## 2017-07-06 NOTE — Progress Notes (Signed)
Subjective:  I acted as a Education administrator for Dr. Charlett Blake. [name], [place]  Patient ID: Tyler Sims, male    DOB: May 21, 1956, 61 y.o.   MRN: [id number]  No chief complaint on file.   HPI  Patient is in today for an annual exam. He states his neuropathy is not doing well. He also states he is not sleeping well at night only getting about 3-5 hours at night. No recent febrile illness or acute hospitalizations. Denies CP/palp/SOB/HA/congestion/fevers/GI or GU c/o. Taking meds as prescribed. His neuropathy continues to plague him and the Gabapentin only helps a little. He denies any side effects. He is only sleeping 3-5 hours a night due to stress and pain. He denies any significant anhedonia. He is doing well with ADLs. Tries to maintain a heart healthy diet but does not exercise regularly.     Patient Care Team: Mosie Lukes, MD as PCP - General (Family Medicine) Gatha Mayer, MD as Consulting Physician (Gastroenterology) Garvin Fila, MD as Consulting Physician (Neurology)   Past Medical History:  Diagnosis Date  . Abnormal transaminases   . Acid reflux   . Bradycardia    nocturnal  . Colon polyps 02/07/2012   Dr. Silvano Rusk  . Elevated sed rate 07/06/2017  . HTN (hypertension) 06/02/2014  . Low back pain 06/15/2015  . Measles ([place])   . Mitral regurgitation   . Mumps   . Neuropathy 01/10/2016  . NICM (nonischemic cardiomyopathy) ([place])   . Persistent atrial fibrillation ([place]) 08/24/2010  . Preventative health care 12/04/2013   Colonsocopy May '13 - adenomatous polyps  Immunizations Tdap Jan '14   . Stroke ([place]) 08/24/2010  . Systolic CHF ([place])   . Urinary hesitancy 12/21/2014  . Varicella   . Ventricular tachycardia, non-sustained [facility])     Past Surgical History:  Procedure Laterality Date  . CARDIAC CATHETERIZATION    . CARDIOVERSION  08/15/2012   Procedure: CARDIOVERSION;  Surgeon: Deboraha Sprang, MD;  Location: [facility] ENDOSCOPY;  Service: Cardiovascular;  Laterality: N/A;    . COLONOSCOPY W/ BIOPSIES  02/07/2012   Dr. Silvano Rusk    Family History  Problem Relation Age of Onset  . Alzheimer's disease Mother   . Lung cancer Brother        x 2, smokers  . Hypertension Brother   . Heart disease Brother   . Cancer Brother   . Lung cancer Brother   . Hypertension Brother   . Colon polyps Brother   . Hypertension Brother   . Hypertension Sister   . Neuropathy Sister   . Arthritis Sister   . Heart disease Father        CAD/MI  . Hypertension Father   . Mental illness Father        h/o depression requiring hospitalization  . Hyperlipidemia Sister        x 2  . Hypertension Sister   . Arthritis Sister   . Obesity Sister   . Varicose Veins Daughter   . Birth defects Paternal Aunt     Social History   Social History  . Marital status: Married    Spouse name: Charlett Nose  . Number of children: 2  . Years of education: 16   Occupational History  . Nature conservation officer   .  [employer]   Social History Main Topics  . Smoking status: Never Smoker  . Smokeless tobacco: Never Used  . Alcohol use 0.0 oz/week     Comment:  occasional wine  . Drug use: No  . Sexual activity: Yes    Partners: Female     Comment: lives with wife, no dietary restrictions, except avoid dairy, uses [employer], works with [employer]   Other Topics Concern  . Not on file   Social History Narrative   Patient is married with 2 children. Store Scientist, clinical (histocompatibility and immunogenetics) [employer]   1 daughter attorney in [place]   Other daughter [employer] grad - pursuing PhD in [place]   Patient is right handed.   Patient has 16 years of education.   Patient drinks 1-2 cups daily.    Outpatient Medications Prior to Visit  Medication Sig Dispense Refill  . atorvastatin (LIPITOR) 20 MG tablet TAKE 1 TABLET DAILY AT 6 P.M. 90 tablet 0  . cholecalciferol (VITAMIN D) 1000 units tablet Take 1 tablet (1,000 Units total) by mouth daily.    . COD LIVER OIL PO Take 1 capsule by mouth daily.     . Coenzyme  Q10 (CO Q-10) 200 MG CAPS Take 200 mg by mouth daily.     Marland Kitchen losartan (COZAAR) 25 MG tablet TAKE 1 TABLET BY MOUTH EVERY DAY 90 tablet 3  . methocarbamol (ROBAXIN) 500 MG tablet Take 1 tablet (500 mg total) by mouth 2 (two) times daily as needed for muscle spasms. 40 tablet 1  . metoprolol succinate (TOPROL-XL) 25 MG 24 hr tablet Take 1 tablet (25 mg total) by mouth daily. 90 tablet 0  . spironolactone (ALDACTONE) 25 MG tablet Take 0.5 tablets (12.5 mg total) by mouth daily. 45 tablet 0  . warfarin (COUMADIN) 5 MG tablet Take 1 1/2 tablets daily except 1 tablet on Mon/Wed/Fridays OR AS DIRECTED BY ANTICOAGULATION CLINIC 120 tablet 1  . gabapentin (NEURONTIN) 600 MG tablet Take 1 tablet (600 mg total) by mouth 3 (three) times daily. 270 tablet 0   No facility-administered medications prior to visit.     Allergies  Allergen Reactions  . Lisinopril Rash  . Simvastatin Rash    Review of Systems  Constitutional: Negative for fever and malaise/fatigue.  HENT: Negative for congestion.   Eyes: Negative for blurred vision.  Respiratory: Negative for cough and shortness of breath.   Cardiovascular: Negative for chest pain, palpitations and leg swelling.  Gastrointestinal: Negative for vomiting.  Musculoskeletal: Positive for myalgias. Negative for back pain and joint pain.  Skin: Negative for rash.  Neurological: Positive for tingling. Negative for loss of consciousness and headaches.  Psychiatric/Behavioral: The patient has insomnia.        Objective:    Physical Exam  Constitutional: He is oriented to person, place, and time. He appears well-developed and well-nourished. No distress.  HENT:  Head: Normocephalic and atraumatic.  Eyes: Conjunctivae are normal.  Neck: Normal range of motion. No thyromegaly present.  Cardiovascular: Normal rate and regular rhythm.   Pulmonary/Chest: Effort normal and breath sounds normal. He has no wheezes.  Abdominal: Soft. Bowel sounds are normal. There  is no tenderness.  Musculoskeletal: Normal range of motion. He exhibits no edema or deformity.  Neurological: He is alert and oriented to person, place, and time.  Skin: Skin is warm and dry. He is not diaphoretic.  Psychiatric: He has a normal mood and affect.    BP 124/68 (BP Location: Left Arm, Patient Position: Sitting, Cuff Size: Normal)   Pulse 62   Temp 98.4 F (36.9 C) (Oral)   Resp 18   Ht 6\' 3"  (1.905 m)   Wt 221 lb (  100.2 kg)   SpO2 97%   BMI 27.62 kg/m  Wt Readings from Last 3 Encounters:  07/06/17 221 lb (100.2 kg)  05/02/17 223 lb (101.2 kg)  12/29/16 222 lb 6.4 oz (100.9 kg)   BP Readings from Last 3 Encounters:  07/06/17 124/68  05/02/17 (!) 146/86  12/29/16 137/73     Immunization History  Administered Date(s) Administered  . Influenza Split 06/05/2012  . Influenza,inj,Quad PF,6+ Mos 05/28/2013, 05/30/2014, 06/15/2015, 05/25/2016, 05/17/2017  . Tdap 10/03/2012    Health Maintenance  Topic Date Due  . Hepatitis C Screening  04-Mar-1956  . HIV Screening  08/15/1972  . COLONOSCOPY  06/21/2020  . TETANUS/TDAP  10/03/2022  . INFLUENZA VACCINE  Completed    Lab Results  Component Value Date   WBC 4.6 12/29/2016   HGB 13.6 12/29/2016   HCT 39.3 12/29/2016   PLT 205.0 12/29/2016   GLUCOSE 96 12/29/2016   CHOL 138 12/29/2016   TRIG 51.0 12/29/2016   HDL 56.00 12/29/2016   LDLDIRECT 110.4 10/03/2012   LDLCALC 72 12/29/2016   ALT 18 12/29/2016   AST 26 12/29/2016   NA 139 12/29/2016   K 4.7 12/29/2016   CL 105 12/29/2016   CREATININE 0.93 12/29/2016   BUN 15 12/29/2016   CO2 29 12/29/2016   TSH 1.89 12/29/2016   PSA 0.95 12/09/2014   INR 2.5 06/28/2017   HGBA1C  08/28/2010    4.7 (NOTE)                                                                       According to the ADA Clinical Practice Recommendations for 2011, when HbA1c is used as a screening test:   >=6.5%   Diagnostic of Diabetes Mellitus           (if abnormal result  is  confirmed)  5.7-6.4%   Increased risk of developing Diabetes Mellitus  References:Diagnosis and Classification of Diabetes Mellitus,Diabetes WVPX,1062,69(SWNIO 1):S62-S69 and Standards of Medical Care in         Diabetes - 2011,Diabetes Care,2011,34  (Suppl 1):S11-S61.    Lab Results  Component Value Date   TSH 1.89 12/29/2016   Lab Results  Component Value Date   WBC 4.6 12/29/2016   HGB 13.6 12/29/2016   HCT 39.3 12/29/2016   MCV 92.4 12/29/2016   PLT 205.0 12/29/2016   Lab Results  Component Value Date   NA 139 12/29/2016   K 4.7 12/29/2016   CO2 29 12/29/2016   GLUCOSE 96 12/29/2016   BUN 15 12/29/2016   CREATININE 0.93 12/29/2016   BILITOT 1.2 12/29/2016   ALKPHOS 67 12/29/2016   AST 26 12/29/2016   ALT 18 12/29/2016   PROT 7.9 12/29/2016   ALBUMIN 4.2 12/29/2016   CALCIUM 9.8 12/29/2016   GFR 106.81 12/29/2016   Lab Results  Component Value Date   CHOL 138 12/29/2016   Lab Results  Component Value Date   HDL 56.00 12/29/2016   Lab Results  Component Value Date   LDLCALC 72 12/29/2016   Lab Results  Component Value Date   TRIG 51.0 12/29/2016   Lab Results  Component Value Date   CHOLHDL 2 12/29/2016   Lab Results  Component Value Date  HGBA1C  08/28/2010    4.7 (NOTE)                                                                       According to the ADA Clinical Practice Recommendations for 2011, when HbA1c is used as a screening test:   >=6.5%   Diagnostic of Diabetes Mellitus           (if abnormal result  is confirmed)  5.7-6.4%   Increased risk of developing Diabetes Mellitus  References:Diagnosis and Classification of Diabetes Mellitus,Diabetes XTKW,4097,35(HGDJM 1):S62-S69 and Standards of Medical Care in         Diabetes - 2011,Diabetes EQAS,3419,62  (Suppl 1):S11-S61.         Assessment & Plan:   Problem List Items Addressed This Visit    Hyperlipidemia    Tolerating statin, encouraged heart healthy diet, avoid trans fats,  minimize simple carbs and saturated fats. Increase exercise as tolerated      Relevant Orders   Lipid panel   Atrial fibrillation (HCC)    Tolerating Coumadin, rate controlled.       Preventative health care    Patient encouraged to maintain heart healthy diet, regular exercise, adequate sleep. Consider daily probiotics. Take medications as prescribed. Patient encouraged to maintain heart healthy diet, regular exercise, adequate sleep. Consider daily probiotics. Take medications as prescribed      HTN (hypertension)    Well controlled, no changes to meds. Encouraged heart healthy diet such as the DASH diet and exercise as tolerated.       Relevant Orders   CBC with Differential/Platelet   Comprehensive metabolic panel   TSH   Urinary hesitancy    Check PSA      Relevant Orders   Vitamin B12   PSA   Vitamin D deficiency    Check Vitamin D level today, he believes he is taking Vitamin D 2000 daily      Relevant Orders   VITAMIN D 25 Hydroxy (Vit-D Deficiency, Fractures)   Neuropathy    His Gabapentin 600 mg is not causing side effects but not helping pain enough. He just got a 90 day supply so he will increase from tid to qid and if it helps he will notify us when running low so we can change precription to the 800 mg tabs, take 1 tid instead. I it does not work consider switch to Lyrica.      Relevant Orders   Vitamin B12   Folate   Vitamin B1   Elevated sed rate    Elevated several years ago with worsening neuropathy will recheck.      Relevant Orders   Sedimentation rate    Other Visit Diagnoses    Other problems related to lifestyle    -  Primary   Relevant Orders   Hepatitis C Antibody      I have changed Mr. Boyd's gabapentin. I am also having him maintain his COD LIVER OIL PO, Co Q-10, methocarbamol, cholecalciferol, spironolactone, metoprolol succinate, warfarin, atorvastatin, and losartan.  Meds ordered this encounter  Medications  . gabapentin  (NEURONTIN) 600 MG tablet    Sig: Take 1 tablet (600 mg total) by mouth 4 (four) times daily.    Dispense:  270 tablet    Refill:  0    CMA served as scribe during this visit. History, Physical and Plan performed by medical provider. Documentation and orders reviewed and attested to.  Penni Homans, MD

## 2017-07-06 NOTE — Assessment & Plan Note (Signed)
Elevated several years ago with worsening neuropathy will recheck.

## 2017-07-06 NOTE — Assessment & Plan Note (Addendum)
His Gabapentin 600 mg is not causing side effects but not helping pain enough. He just got a 90 day supply so he will increase from tid to qid and if it helps he will notify us when running low so we can change precription to the 800 mg tabs, take 1 tid instead. I it does not work consider switch to Lyrica.

## 2017-07-06 NOTE — Assessment & Plan Note (Addendum)
Check Vitamin D level today, he believes he is taking Vitamin D 2000 daily

## 2017-07-10 LAB — HEPATITIS C ANTIBODY
HEP C AB: NONREACTIVE
SIGNAL TO CUT-OFF: 0.05 (ref <1.00)

## 2017-07-10 LAB — VITAMIN B1: Vitamin B1 (Thiamine): 6 nmol/L — ABNORMAL LOW (ref 8–30)

## 2017-07-12 MED ORDER — VITAMIN B-1 100 MG PO TABS: 100.0000 mg | ORAL_TABLET | Freq: Every day | ORAL | 5 refills | Status: DC

## 2017-07-12 MED ORDER — FOLIC ACID 1 MG PO TABS: 1.0000 mg | ORAL_TABLET | Freq: Every day | ORAL | 5 refills | Status: DC

## 2017-07-12 NOTE — Addendum Note (Signed)
Addended by: Magdalene Molly A on: 07/12/2017 11:15 AM   Modules accepted: Orders

## 2017-07-17 ENCOUNTER — Other Ambulatory Visit: Payer: Self-pay | Admitting: Internal Medicine

## 2017-08-01 ENCOUNTER — Encounter: Payer: Self-pay | Admitting: Family Medicine

## 2017-08-09 ENCOUNTER — Ambulatory Visit: Payer: Managed Care, Other (non HMO)

## 2017-08-09 ENCOUNTER — Ambulatory Visit: Payer: Managed Care, Other (non HMO) | Admitting: General Practice

## 2017-08-09 DIAGNOSIS — Z7901 Long term (current) use of anticoagulants: Secondary | ICD-10-CM | POA: Diagnosis not present

## 2017-08-09 DIAGNOSIS — I4891 Unspecified atrial fibrillation: Secondary | ICD-10-CM

## 2017-08-09 LAB — POCT INR: INR: 2.4

## 2017-08-09 NOTE — Patient Instructions (Addendum)
Pre visit review using our clinic review tool, if applicable. No additional management support is needed unless otherwise documented below in the visit note.  Continue to take 1.5 tablets all days except 1 tablet on Mon/Wed/Friday.  Re-check  In 6 weeks.

## 2017-08-22 ENCOUNTER — Other Ambulatory Visit: Payer: Self-pay | Admitting: Family Medicine

## 2017-08-25 ENCOUNTER — Other Ambulatory Visit: Payer: Self-pay | Admitting: Family Medicine

## 2017-08-28 ENCOUNTER — Other Ambulatory Visit: Payer: Self-pay | Admitting: Family Medicine

## 2017-08-30 ENCOUNTER — Encounter: Payer: Self-pay | Admitting: Family Medicine

## 2017-08-31 MED ORDER — GABAPENTIN 800 MG PO TABS: 800.0000 mg | ORAL_TABLET | Freq: Three times a day (TID) | ORAL | 5 refills | Status: DC

## 2017-08-31 NOTE — Telephone Encounter (Signed)
Please change his Gabapentin. D/C the 600 mg tabs and prescribe 800 mg tabs, 1 tab po tid, disp 90 day supply with 1 rf or 30 day supply with 5 refills. And let pateint know

## 2017-08-31 NOTE — Telephone Encounter (Signed)
Rx sent. Pt informed.  

## 2017-09-26 ENCOUNTER — Ambulatory Visit: Payer: Managed Care, Other (non HMO) | Admitting: General Practice

## 2017-09-26 DIAGNOSIS — Z7901 Long term (current) use of anticoagulants: Secondary | ICD-10-CM

## 2017-09-26 DIAGNOSIS — I4891 Unspecified atrial fibrillation: Secondary | ICD-10-CM

## 2017-09-26 LAB — POCT INR: INR: 3.7

## 2017-09-26 NOTE — Patient Instructions (Signed)
Pre visit review using our clinic review tool, if applicable. No additional management support is needed unless otherwise documented below in the visit note.  Skip dose today and then continue to take 1.5 tablets all days except 1 tablet on Mon/Wed/Friday.  Re-check  In 4 weeks.

## 2017-09-27 ENCOUNTER — Ambulatory Visit: Payer: Managed Care, Other (non HMO)

## 2017-10-13 ENCOUNTER — Other Ambulatory Visit: Payer: Self-pay | Admitting: Family Medicine

## 2017-10-19 ENCOUNTER — Other Ambulatory Visit: Payer: Self-pay | Admitting: Family Medicine

## 2017-10-24 ENCOUNTER — Ambulatory Visit: Payer: Managed Care, Other (non HMO)

## 2017-10-31 ENCOUNTER — Ambulatory Visit: Payer: Managed Care, Other (non HMO) | Admitting: General Practice

## 2017-10-31 DIAGNOSIS — Z7901 Long term (current) use of anticoagulants: Secondary | ICD-10-CM

## 2017-10-31 DIAGNOSIS — I4891 Unspecified atrial fibrillation: Secondary | ICD-10-CM

## 2017-10-31 LAB — POCT INR: INR: 2.8

## 2017-10-31 NOTE — Patient Instructions (Addendum)
Pre visit review using our clinic review tool, if applicable. No additional management support is needed unless otherwise documented below in the visit note.  Continue to take 1.5 tablets all days except 1 tablet on Mon/Wed/Friday.  Re-check  In 4 weeks. 

## 2017-11-17 ENCOUNTER — Other Ambulatory Visit: Payer: Self-pay | Admitting: Family Medicine

## 2017-11-23 ENCOUNTER — Other Ambulatory Visit: Payer: Self-pay | Admitting: Family Medicine

## 2017-11-28 ENCOUNTER — Ambulatory Visit: Payer: Managed Care, Other (non HMO) | Admitting: General Practice

## 2017-11-28 DIAGNOSIS — Z7901 Long term (current) use of anticoagulants: Secondary | ICD-10-CM | POA: Diagnosis not present

## 2017-11-28 LAB — POCT INR: INR: 4

## 2017-11-28 NOTE — Patient Instructions (Addendum)
Pre visit review using our clinic review tool, if applicable. No additional management support is needed unless otherwise documented below in the visit note.  Skip coumadin today (3/12) and take 1/2 tablet (2.5 mg) tomorrow (3/13) and then continue to take 1.5 tablets all days except 1 tablet on Mon/Wed/Friday.  Re-check  In 4 weeks.

## 2017-12-26 ENCOUNTER — Ambulatory Visit: Payer: Managed Care, Other (non HMO) | Admitting: General Practice

## 2017-12-26 DIAGNOSIS — Z7901 Long term (current) use of anticoagulants: Secondary | ICD-10-CM

## 2017-12-26 DIAGNOSIS — I4891 Unspecified atrial fibrillation: Secondary | ICD-10-CM

## 2017-12-26 LAB — POCT INR: INR: 2.6

## 2017-12-26 NOTE — Patient Instructions (Signed)
Pre visit review using our clinic review tool, if applicable. No additional management support is needed unless otherwise documented below in the visit note. 

## 2017-12-30 ENCOUNTER — Other Ambulatory Visit: Payer: Self-pay | Admitting: Family Medicine

## 2018-01-04 ENCOUNTER — Encounter: Payer: Self-pay | Admitting: Family Medicine

## 2018-01-04 ENCOUNTER — Ambulatory Visit: Payer: Managed Care, Other (non HMO) | Admitting: Family Medicine

## 2018-01-04 VITALS — BP 126/72 | HR 62 | Temp 98.0°F | Resp 18 | Ht 75.0 in | Wt 219.0 lb

## 2018-01-04 DIAGNOSIS — E559 Vitamin D deficiency, unspecified: Secondary | ICD-10-CM | POA: Diagnosis not present

## 2018-01-04 DIAGNOSIS — E519 Thiamine deficiency, unspecified: Secondary | ICD-10-CM | POA: Diagnosis not present

## 2018-01-04 DIAGNOSIS — G629 Polyneuropathy, unspecified: Secondary | ICD-10-CM

## 2018-01-04 DIAGNOSIS — E782 Mixed hyperlipidemia: Secondary | ICD-10-CM | POA: Diagnosis not present

## 2018-01-04 DIAGNOSIS — I10 Essential (primary) hypertension: Secondary | ICD-10-CM

## 2018-01-04 LAB — COMPREHENSIVE METABOLIC PANEL
ALT: 24 U/L (ref 0–53)
AST: 26 U/L (ref 0–37)
Albumin: 4.1 g/dL (ref 3.5–5.2)
Alkaline Phosphatase: 69 U/L (ref 39–117)
BILIRUBIN TOTAL: 0.9 mg/dL (ref 0.2–1.2)
BUN: 17 mg/dL (ref 6–23)
CO2: 27 meq/L (ref 19–32)
Calcium: 9.4 mg/dL (ref 8.4–10.5)
Chloride: 105 mEq/L (ref 96–112)
Creatinine, Ser: 1.09 mg/dL (ref 0.40–1.50)
GFR: 88.63 mL/min (ref >60.00)
GLUCOSE: 95 mg/dL (ref 70–99)
POTASSIUM: 4.3 meq/L (ref 3.5–5.1)
SODIUM: 139 meq/L (ref 135–145)
Total Protein: 7.4 g/dL (ref 6.0–8.3)

## 2018-01-04 LAB — LIPID PANEL
CHOL/HDL RATIO: 2
Cholesterol: 124 mg/dL (ref 0–200)
HDL: 50.5 mg/dL (ref >39.00)
LDL Cholesterol: 58 mg/dL (ref 0–99)
NONHDL: 73.82
Triglycerides: 79 mg/dL (ref 0.0–149.0)
VLDL: 15.8 mg/dL (ref 0.0–40.0)

## 2018-01-04 LAB — VITAMIN D 25 HYDROXY (VIT D DEFICIENCY, FRACTURES): VITD: 57.03 ng/mL (ref 30.00–100.00)

## 2018-01-04 MED ORDER — METOPROLOL SUCCINATE ER 25 MG PO TB24: 25.0000 mg | ORAL_TABLET | Freq: Every day | ORAL | 1 refills | Status: DC

## 2018-01-04 NOTE — Progress Notes (Signed)
Subjective:  I acted as a Education administrator for Dr. Charlett Blake. [name], [place]  Patient ID: Tyler Sims, male    DOB: [birth year], 62 y.o.   MRN: [id number]  Chief Complaint  Patient presents with  . Follow-up    HPI  Patient is in today for a 6 month follow up. He is following up on his HTN, and other medical concerns.  He is dealing with on going  Neuropathy, fatigue, not worsening. His back pain is still presistent, with minimal treatment.  Ready for retirement.  No recent febrile illness or acute hospitalizations. Denies CP/palp/SOB/HA/congestion/fevers/GI or GU c/o. Taking meds as prescribed    Patient Care Team: Mosie Lukes, MD as PCP - General (Family Medicine) Gatha Mayer, MD as Consulting Physician (Gastroenterology) Garvin Fila, MD as Consulting Physician (Neurology)   Past Medical History:  Diagnosis Date  . Abnormal transaminases   . Acid reflux   . Bradycardia    nocturnal  . Colon polyps 02/07/2012   Dr. Silvano Rusk  . Elevated sed rate 07/06/2017  . HTN (hypertension) 06/02/2014  . Low back pain 06/15/2015  . Measles ([place])   . Mitral regurgitation   . Mumps   . Neuropathy 01/10/2016  . NICM (nonischemic cardiomyopathy) ([place])   . Persistent atrial fibrillation ([place]) 08/24/2010  . Preventative health care 12/04/2013   Colonsocopy May '13 - adenomatous polyps  Immunizations Tdap Jan '14   . Stroke ([place]) 08/24/2010  . Systolic CHF ([place])   . Urinary hesitancy 12/21/2014  . Varicella   . Ventricular tachycardia, non-sustained [facility])     Past Surgical History:  Procedure Laterality Date  . CARDIAC CATHETERIZATION    . CARDIOVERSION  08/15/2012   Procedure: CARDIOVERSION;  Surgeon: Deboraha Sprang, MD;  Location: [facility] ENDOSCOPY;  Service: Cardiovascular;  Laterality: N/A;  . COLONOSCOPY W/ BIOPSIES  02/07/2012   Dr. Silvano Rusk    Family History  Problem Relation Age of Onset  . Alzheimer's disease Mother   . Lung cancer Brother        x 2, smokers  .  Hypertension Brother   . Heart disease Brother   . Cancer Brother   . Lung cancer Brother   . Hypertension Brother   . Colon polyps Brother   . Hypertension Brother   . Hypertension Sister   . Neuropathy Sister   . Arthritis Sister   . Heart disease Father        CAD/MI  . Hypertension Father   . Mental illness Father        h/o depression requiring hospitalization  . Hyperlipidemia Sister        x 2  . Hypertension Sister   . Arthritis Sister   . Obesity Sister   . Varicose Veins Daughter   . Birth defects Paternal Aunt     Social History   Socioeconomic History  . Marital status: Married    Spouse name: Charlett Nose  . Number of children: 2  . Years of education: 67  . Highest education level: Not on file  Occupational History  . Occupation: Health visitor: [employer]  . Financial resource strain: Not on file  . Food insecurity:    Worry: Not on file    Inability: Not on file  . Transportation needs:    Medical: Not on file    Non-medical: Not on file  Tobacco Use  . Smoking status: Never Smoker  .  Smokeless tobacco: Never Used  Substance and Sexual Activity  . Alcohol use: Yes    Alcohol/week: 0.0 oz    Comment: occasional wine  . Drug use: No  . Sexual activity: Yes    Partners: Female    Comment: lives with wife, no dietary restrictions, except avoid dairy, uses Lact[employer], works with [employer]  Lifestyle  . Physical activity:    Days per week: Not on file    Minutes per session: Not on file  . Stress: Not on file  Relationships  . Social connections:    Talks on phone: Not on file    Gets together: Not on file    Attends religious service: Not on file    Active member of club or organization: Not on file    Attends meetings of clubs or organizations: Not on file    Relationship status: Not on file  . Intimate partner violence:    Fear of current or ex partner: Not on file    Emotionally abused: Not on file      Physically abused: Not on file    Forced sexual activity: Not on file  Other Topics Concern  . Not on file  Social History Narrative   Patient is married with 2 children. Store Scientist, clinical (histocompatibility and immunogenetics) [employer]   1 daughter attorney in [place]   Other daughter [employer] grad - pursuing PhD in [place]   Patient is right handed.   Patient has 16 years of education.   Patient drinks 1-2 cups daily.    Outpatient Medications Prior to Visit  Medication Sig Dispense Refill  . atorvastatin (LIPITOR) 20 MG tablet TAKE 1 TABLET DAILY AT 6 P.M. 90 tablet 0  . cholecalciferol (VITAMIN D) 1000 units tablet Take 1 tablet (1,000 Units total) by mouth daily.    . COD LIVER OIL PO Take 1 capsule by mouth daily.     . Coenzyme Q10 (CO Q-10) 200 MG CAPS Take 200 mg by mouth daily.     . folic acid (FOLVITE) 1 MG tablet TAKE 1 TABLET BY MOUTH EVERY DAY 30 tablet 5  . gabapentin (NEURONTIN) 800 MG tablet Take 1 tablet (800 mg total) by mouth 3 (three) times daily. 90 tablet 5  . losartan (COZAAR) 25 MG tablet TAKE 1 TABLET BY MOUTH EVERY DAY 90 tablet 3  . spironolactone (ALDACTONE) 25 MG tablet Take 0.5 tablets (12.5 mg total) by mouth daily. 45 tablet 2  . thiamine (VITAMIN B-1) 100 MG tablet Take 1 tablet (100 mg total) by mouth daily. 30 tablet 5  . warfarin (COUMADIN) 5 MG tablet TAKE ONE AND ONE-HALF TABLETS DAILY EXCEPT 1 TABLET ON MONDAY, WEDNESDAY, AND FRIDAYS OR AS DIRECTED BY ANTICOAGULATION CLINIC 120 tablet 1  . atorvastatin (LIPITOR) 20 MG tablet TAKE 1 TABLET DAILY AT 6 P.M. 90 tablet 0  . atorvastatin (LIPITOR) 20 MG tablet TAKE 1 TABLET DAILY AT 6 P.M. 90 tablet 0  . atorvastatin (LIPITOR) 20 MG tablet TAKE 1 TABLET BY MOUTH ONCE DAILY AT 6PM 90 tablet 0  . methocarbamol (ROBAXIN) 500 MG tablet Take 1 tablet (500 mg total) by mouth 2 (two) times daily as needed for muscle spasms. 40 tablet 1  . metoprolol succinate (TOPROL-XL) 25 MG 24 hr tablet TAKE 1 TABLET BY MOUTH EVERY DAY 90 tablet 0  . metoprolol  succinate (TOPROL-XL) 25 MG 24 hr tablet TAKE 1 TABLET BY MOUTH EVERY DAY 90 tablet 0   No facility-administered medications prior to  visit.     Allergies  Allergen Reactions  . Lisinopril Rash  . Simvastatin Rash    Review of Systems  Constitutional: Positive for malaise/fatigue. Negative for fever.  HENT: Negative for congestion.   Eyes: Negative for blurred vision.  Respiratory: Negative for cough and shortness of breath.   Cardiovascular: Negative for chest pain, palpitations and leg swelling.  Gastrointestinal: Negative for vomiting.  Musculoskeletal: Positive for myalgias. Negative for back pain.  Skin: Negative for rash.  Neurological: Positive for tingling. Negative for loss of consciousness and headaches.       Objective:    Physical Exam  Constitutional: He is oriented to person, place, and time. He appears well-developed and well-nourished. No distress.  HENT:  Head: Normocephalic and atraumatic.  Eyes: Conjunctivae are normal.  Neck: Normal range of motion. No thyromegaly present.  Cardiovascular: Normal rate.  Rate controlled irregular rhythm   Pulmonary/Chest: Effort normal and breath sounds normal. He has no wheezes.  Abdominal: Soft. Bowel sounds are normal. There is no tenderness.  Musculoskeletal: Normal range of motion. He exhibits no edema or deformity.  Neurological: He is alert and oriented to person, place, and time.  Skin: Skin is warm and dry. He is not diaphoretic.  Psychiatric: He has a normal mood and affect.    BP 126/72 (BP Location: Left Arm, Patient Position: Sitting, Cuff Size: Normal)   Pulse 62   Temp 98 F (36.7 C) (Oral)   Resp 18   Ht 6\' 3"  (1.905 m)   Wt 219 lb (99.3 kg)   SpO2 96%   BMI 27.37 kg/m  Wt Readings from Last 3 Encounters:  01/04/18 219 lb (99.3 kg)  07/06/17 221 lb (100.2 kg)  05/02/17 223 lb (101.2 kg)   BP Readings from Last 3 Encounters:  01/04/18 126/72  07/06/17 124/68  05/02/17 (!) 146/86      Immunization History  Administered Date(s) Administered  . Influenza Split 06/05/2012  . Influenza,inj,Quad PF,6+ Mos 05/28/2013, 05/30/2014, 06/15/2015, 05/25/2016, 05/17/2017  . Tdap 10/03/2012    Health Maintenance  Topic Date Due  . INFLUENZA VACCINE  04/19/2018  . COLONOSCOPY  06/21/2020  . TETANUS/TDAP  10/03/2022  . Hepatitis C Screening  Completed  . HIV Screening  Discontinued    Lab Results  Component Value Date   WBC 5.6 07/06/2017   HGB 13.8 07/06/2017   HCT 40.9 07/06/2017   PLT 189.0 07/06/2017   GLUCOSE 95 01/04/2018   CHOL 124 01/04/2018   TRIG 79.0 01/04/2018   HDL 50.50 01/04/2018   LDLDIRECT 110.4 10/03/2012   LDLCALC 58 01/04/2018   ALT 24 01/04/2018   AST 26 01/04/2018   NA 139 01/04/2018   K 4.3 01/04/2018   CL 105 01/04/2018   CREATININE 1.09 01/04/2018   BUN 17 01/04/2018   CO2 27 01/04/2018   TSH 1.81 07/06/2017   PSA 1.68 07/06/2017   INR 2.6 12/26/2017   HGBA1C  08/28/2010    4.7 (NOTE)                                                                       According to the ADA Clinical Practice Recommendations for 2011, when HbA1c is used as a screening test:   >=6.5%  Diagnostic of Diabetes Mellitus           (if abnormal result  is confirmed)  5.7-6.4%   Increased risk of developing Diabetes Mellitus  References:Diagnosis and Classification of Diabetes Mellitus,Diabetes YNWG,9562,13(YQMVH 1):S62-S69 and Standards of Medical Care in         Diabetes - 2011,Diabetes Care,2011,34  (Suppl 1):S11-S61.    Lab Results  Component Value Date   TSH 1.81 07/06/2017   Lab Results  Component Value Date   WBC 5.6 07/06/2017   HGB 13.8 07/06/2017   HCT 40.9 07/06/2017   MCV 95.7 07/06/2017   PLT 189.0 07/06/2017   Lab Results  Component Value Date   NA 139 01/04/2018   K 4.3 01/04/2018   CO2 27 01/04/2018   GLUCOSE 95 01/04/2018   BUN 17 01/04/2018   CREATININE 1.09 01/04/2018   BILITOT 0.9 01/04/2018   ALKPHOS 69 01/04/2018    AST 26 01/04/2018   ALT 24 01/04/2018   PROT 7.4 01/04/2018   ALBUMIN 4.1 01/04/2018   CALCIUM 9.4 01/04/2018   GFR 88.63 01/04/2018   Lab Results  Component Value Date   CHOL 124 01/04/2018   Lab Results  Component Value Date   HDL 50.50 01/04/2018   Lab Results  Component Value Date   LDLCALC 58 01/04/2018   Lab Results  Component Value Date   TRIG 79.0 01/04/2018   Lab Results  Component Value Date   CHOLHDL 2 01/04/2018   Lab Results  Component Value Date   HGBA1C  08/28/2010    4.7 (NOTE)                                                                       According to the ADA Clinical Practice Recommendations for 2011, when HbA1c is used as a screening test:   >=6.5%   Diagnostic of Diabetes Mellitus           (if abnormal result  is confirmed)  5.7-6.4%   Increased risk of developing Diabetes Mellitus  References:Diagnosis and Classification of Diabetes Mellitus,Diabetes QION,6295,28(UXLKG 1):S62-S69 and Standards of Medical Care in         Diabetes - 2011,Diabetes Care,2011,34  (Suppl 1):S11-S61.         Assessment & Plan:   Problem List Items Addressed This Visit    Hyperlipidemia    Tolerating statin, encouraged heart healthy diet, avoid trans fats, minimize simple carbs and saturated fats. Increase exercise as tolerated      Relevant Medications   metoprolol succinate (TOPROL-XL) 25 MG 24 hr tablet   Other Relevant Orders   Lipid panel (Completed)   HTN (hypertension)    Well controlled, no changes to meds. Encouraged heart healthy diet such as the DASH diet and exercise as tolerated.       Relevant Medications   metoprolol succinate (TOPROL-XL) 25 MG 24 hr tablet   Other Relevant Orders   Comprehensive metabolic panel (Completed)   Vitamin D deficiency    Takes vitamin D 5000 IU      Relevant Orders   VITAMIN D 25 Hydroxy (Vit-D Deficiency, Fractures) (Completed)   Neuropathy - Primary    Stable but not improved on thiamine       Other  Visit Diagnoses    Thiamine deficiency       Relevant Orders   Vitamin B1 (Completed)      I have discontinued Lior Mcneel's methocarbamol and metoprolol succinate. I have also changed his metoprolol succinate. Additionally, I am having him maintain his COD LIVER OIL PO, Co Q-10, cholecalciferol, losartan, thiamine, spironolactone, warfarin, gabapentin, atorvastatin, and folic acid.  Meds ordered this encounter  Medications  . metoprolol succinate (TOPROL-XL) 25 MG 24 hr tablet    Sig: Take 1 tablet (25 mg total) by mouth daily.    Dispense:  90 tablet    Refill:  1    CMA served as Education administrator during this visit. History, Physical and Plan performed by medical provider. Documentation and orders reviewed and attested to.  Penni Homans, MD

## 2018-01-04 NOTE — Patient Instructions (Signed)

## 2018-01-04 NOTE — Assessment & Plan Note (Signed)
Stable but not improved on thiamine

## 2018-01-04 NOTE — Assessment & Plan Note (Signed)
Takes vitamin D 5000 IU

## 2018-01-04 NOTE — Assessment & Plan Note (Signed)
Follows with Dr Caryl Comes rate controlled

## 2018-01-04 NOTE — Assessment & Plan Note (Signed)
Well controlled, no changes to meds. Encouraged heart healthy diet such as the DASH diet and exercise as tolerated.  °

## 2018-01-04 NOTE — Assessment & Plan Note (Signed)
Tolerating statin, encouraged heart healthy diet, avoid trans fats, minimize simple carbs and saturated fats. Increase exercise as tolerated 

## 2018-01-07 LAB — VITAMIN B1: VITAMIN B1 (THIAMINE): 53 nmol/L — AB (ref 8–30)

## 2018-01-08 ENCOUNTER — Encounter: Payer: Self-pay | Admitting: Family Medicine

## 2018-01-23 ENCOUNTER — Ambulatory Visit: Payer: Managed Care, Other (non HMO) | Admitting: General Practice

## 2018-01-23 DIAGNOSIS — I4891 Unspecified atrial fibrillation: Secondary | ICD-10-CM

## 2018-01-23 DIAGNOSIS — Z7901 Long term (current) use of anticoagulants: Secondary | ICD-10-CM | POA: Diagnosis not present

## 2018-01-23 LAB — POCT INR: INR: 3.7

## 2018-01-23 NOTE — Patient Instructions (Addendum)
Pre visit review using our clinic review tool, if applicable. No additional management support is needed unless otherwise documented below in the visit note.  Hold coumadin today and then continue to take 1.5 tablets all days except 1 tablet on Mon/Wed/Friday.  Re-check  In 4 weeks.

## 2018-02-03 ENCOUNTER — Other Ambulatory Visit: Payer: Self-pay | Admitting: Family Medicine

## 2018-02-20 ENCOUNTER — Ambulatory Visit: Payer: Managed Care, Other (non HMO) | Admitting: General Practice

## 2018-02-20 DIAGNOSIS — Z7901 Long term (current) use of anticoagulants: Secondary | ICD-10-CM | POA: Diagnosis not present

## 2018-02-20 DIAGNOSIS — I4891 Unspecified atrial fibrillation: Secondary | ICD-10-CM

## 2018-02-20 LAB — POCT INR: INR: 2.8 (ref 2.0–3.0)

## 2018-02-20 NOTE — Patient Instructions (Addendum)
Pre visit review using our clinic review tool, if applicable. No additional management support is needed unless otherwise documented below in the visit note.  Continue to take 1.5 tablets all days except 1 tablet on Mon/Wed/Friday.  Re-check  In 4 weeks. 

## 2018-02-26 ENCOUNTER — Other Ambulatory Visit: Payer: Self-pay | Admitting: Family Medicine

## 2018-03-01 ENCOUNTER — Other Ambulatory Visit: Payer: Self-pay | Admitting: Family Medicine

## 2018-03-01 DIAGNOSIS — G629 Polyneuropathy, unspecified: Secondary | ICD-10-CM

## 2018-03-01 MED ORDER — GABAPENTIN 800 MG PO TABS: 800.0000 mg | ORAL_TABLET | Freq: Three times a day (TID) | ORAL | 5 refills | Status: DC

## 2018-03-01 NOTE — Telephone Encounter (Signed)
LOV  01/04/18  Dr. Charlett Blake Last refill 08/31/17  # 90 with 5 refill

## 2018-03-01 NOTE — Telephone Encounter (Signed)
Copied from [place] [id number]. Topic: Quick Communication - Rx Refill/Question >> Mar 01, 2018 12:24 PM Neva Seat wrote: gabapentin (NEURONTIN) 800 MG tablet  Pt needing refills.  [id number] [address], [place] - [address] [place] [address] Phone: [phone] Fax: [phone]

## 2018-03-20 ENCOUNTER — Ambulatory Visit: Payer: Managed Care, Other (non HMO) | Admitting: General Practice

## 2018-03-20 DIAGNOSIS — Z7901 Long term (current) use of anticoagulants: Secondary | ICD-10-CM | POA: Diagnosis not present

## 2018-03-20 DIAGNOSIS — I4891 Unspecified atrial fibrillation: Secondary | ICD-10-CM

## 2018-03-20 LAB — POCT INR: INR: 3 (ref 2.0–3.0)

## 2018-03-20 NOTE — Patient Instructions (Signed)
Pre visit review using our clinic review tool, if applicable. No additional management support is needed unless otherwise documented below in the visit note.  Continue to take 1.5 tablets all days except 1 tablet on Mon/Wed/Friday.  Re-check  In 4 weeks.

## 2018-03-27 ENCOUNTER — Other Ambulatory Visit: Payer: Self-pay | Admitting: Internal Medicine

## 2018-04-17 ENCOUNTER — Ambulatory Visit: Payer: Managed Care, Other (non HMO) | Admitting: General Practice

## 2018-04-17 DIAGNOSIS — Z7901 Long term (current) use of anticoagulants: Secondary | ICD-10-CM

## 2018-04-17 DIAGNOSIS — I4891 Unspecified atrial fibrillation: Secondary | ICD-10-CM

## 2018-04-17 LAB — POCT INR: INR: 3.2 — AB (ref 2.0–3.0)

## 2018-04-17 NOTE — Patient Instructions (Addendum)
Pre visit review using our clinic review tool, if applicable. No additional management support is needed unless otherwise documented below in the visit note.  Skip coumadin today and then continue to take 1.5 tablets all days except 1 tablet on Mon/Wed/Friday.  Re-check  In 4 weeks.

## 2018-05-06 ENCOUNTER — Other Ambulatory Visit: Payer: Self-pay | Admitting: Family Medicine

## 2018-05-15 ENCOUNTER — Ambulatory Visit: Payer: Managed Care, Other (non HMO) | Admitting: General Practice

## 2018-05-15 DIAGNOSIS — Z23 Encounter for immunization: Secondary | ICD-10-CM

## 2018-05-15 DIAGNOSIS — Z7901 Long term (current) use of anticoagulants: Secondary | ICD-10-CM

## 2018-05-15 DIAGNOSIS — I4891 Unspecified atrial fibrillation: Secondary | ICD-10-CM

## 2018-05-15 LAB — POCT INR: INR: 3 (ref 2.0–3.0)

## 2018-05-15 NOTE — Patient Instructions (Signed)
Pre visit review using our clinic review tool, if applicable. No additional management support is needed unless otherwise documented below in the visit note. 

## 2018-05-22 ENCOUNTER — Ambulatory Visit: Payer: Managed Care, Other (non HMO) | Admitting: Internal Medicine

## 2018-05-22 ENCOUNTER — Encounter: Payer: Self-pay | Admitting: Internal Medicine

## 2018-05-22 VITALS — BP 134/88 | HR 70 | Ht 75.0 in | Wt 222.4 lb

## 2018-05-22 DIAGNOSIS — I480 Paroxysmal atrial fibrillation: Secondary | ICD-10-CM | POA: Diagnosis not present

## 2018-05-22 DIAGNOSIS — I428 Other cardiomyopathies: Secondary | ICD-10-CM | POA: Diagnosis not present

## 2018-05-22 DIAGNOSIS — I493 Ventricular premature depolarization: Secondary | ICD-10-CM | POA: Diagnosis not present

## 2018-05-22 NOTE — Patient Instructions (Addendum)
Medication Instructions:  Your physician recommends that you continue on your current medications as directed. Please refer to the Current Medication list given to you today.  Labwork: You will have labs drawn today: CBC and BMP and PT/INR  Testing/Procedures: Your physician has recommended that you have a Cardioversion (DCCV). Electrical Cardioversion uses a jolt of electricity to your heart either through paddles or wired patches attached to your chest. This is a controlled, usually prescheduled, procedure. Defibrillation is done under light anesthesia in the hospital, and you usually go home the day of the procedure. This is done to get your heart back into a normal rhythm. You are not awake for the procedure. Please see the instruction sheet given to you today.   Follow-Up: Your physician wants you to follow-up with Roderic Palau in the Afib clinic in 4 weeks.   Any Other Special Instructions Will Be Listed Below (If Applicable).    You are scheduled for a Cardioversion on Monday Sept 9 with Dr. Harrington Challenger.  Please arrive at the [facility] (Main Entrance A) at [facility]: [address] [place], [place] [address] at 7:30 am. Dennis Bast will need to arrive at 6:30am  DIET: Nothing to eat or drink after midnight except a sip of water with medications (see medication instructions below)  Medication Instructions: Hold All your medications except Coumadin Continue your anticoagulant: Coumadin You will need to continue your anticoagulant after your procedure until you are told by your Provider that it is safe to stop   Labs: You are having labs drawn today  You must have a responsible person to drive you home and stay in the waiting area during your procedure. Failure to do so could result in cancellation.  Bring your insurance cards.  *Special Note: Every effort is made to have your procedure done on time. Occasionally there are emergencies that occur at the hospital that may cause  delays. Please be patient if a delay does occur.    If you need a refill on your cardiac medications before your next appointment, please call your pharmacy.

## 2018-05-22 NOTE — Progress Notes (Signed)
Patient Care Team: Mosie Lukes, MD as PCP - General (Family Medicine) Gatha Mayer, MD as Consulting Physician (Gastroenterology) Garvin Fila, MD as Consulting Physician (Neurology)   HPI  Tyler Sims is a 62 y.o. male Seen in followup for nonischemic cardiomyopathy with which he presented with a stroke and atrial fibrillation.  He is on Coumadin anticoagulation   It was initially hoped that this was secondary to the rate. However, despite restoration of sinus rhythm, he has had persistent left ventricular dysfunction    He also has significant fatigue this seems to be aggravated by long work hours.  . He notes that his heart rate runs in the   50s. He says his wife does not complaining much of his snoring.   This continues to be an issue. He has occasional   exercise associated chest heaviness. He has known normal catheterization 2012 which I reviewed today.   He is to say his last couple of months of increasing lassitude shortness of breath fatigue.  He has had vague fleeting chest pains.  He has had irregularity  Last INR was about 3  DATE TEST EF   1/1 LHC  CAs normal  1/12 Echo 20-25 %   1/14 Echo 40-45 %   8/17 Echo 45-50 %        Past Medical History:  Diagnosis Date  . Abnormal transaminases   . Acid reflux   . Bradycardia    nocturnal  . Colon polyps 02/07/2012   Dr. Silvano Rusk  . Elevated sed rate 07/06/2017  . HTN (hypertension) 06/02/2014  . Low back pain 06/15/2015  . Measles ([place])   . Mitral regurgitation   . Mumps   . Neuropathy 01/10/2016  . NICM (nonischemic cardiomyopathy) ([place])   . Persistent atrial fibrillation ([place]) 08/24/2010  . Preventative health care 12/04/2013   Colonsocopy May '13 - adenomatous polyps  Immunizations Tdap Jan '14   . Stroke ([place]) 08/24/2010  . Systolic CHF ([place])   . Urinary hesitancy 12/21/2014  . Varicella   . Ventricular tachycardia, non-sustained [facility])     Past Surgical History:  Procedure Laterality Date   . CARDIAC CATHETERIZATION    . CARDIOVERSION  08/15/2012   Procedure: CARDIOVERSION;  Surgeon: Deboraha Sprang, MD;  Location: [place];  Service: Cardiovascular;  Laterality: N/A;  . COLONOSCOPY W/ BIOPSIES  02/07/2012   Dr. Silvano Rusk    Current Outpatient Medications  Medication Sig Dispense Refill  . atorvastatin (LIPITOR) 20 MG tablet Take 20 mg by mouth daily.    . cholecalciferol (VITAMIN D) 1000 units tablet Take 1 tablet (1,000 Units total) by mouth daily.    . COD LIVER OIL PO Take 1 capsule by mouth daily.     . Coenzyme Q10 (CO Q-10) 200 MG CAPS Take 200 mg by mouth daily.     . folic acid (FOLVITE) 1 MG tablet TAKE 1 TABLET BY MOUTH EVERY DAY 30 tablet 5  . gabapentin (NEURONTIN) 800 MG tablet Take 1 tablet (800 mg total) by mouth 3 (three) times daily. 90 tablet 5  . losartan (COZAAR) 25 MG tablet TAKE 1 TABLET BY MOUTH EVERY DAY 90 tablet 3  . metoprolol succinate (TOPROL-XL) 25 MG 24 hr tablet Take 1 tablet (25 mg total) by mouth daily. 90 tablet 1  . spironolactone (ALDACTONE) 25 MG tablet Take 12.5 mg by mouth daily.    Marland Kitchen thiamine (VITAMIN B-1) 100 MG tablet Take 1 tablet (100 mg total) by mouth  daily. 30 tablet 5  . warfarin (COUMADIN) 5 MG tablet TAKE ONE AND ONE-HALF TABLETS DAILY EXCEPT 1 TABLET ON MONDAY, WEDNESDAY, AND FRIDAYS OR AS DIRECTED BY ANTICOAGULATION CLINIC 120 tablet 1   No current facility-administered medications for this visit.     Allergies  Allergen Reactions  . Lisinopril Rash  . Simvastatin Rash    Review of Systems negative except from HPI and PMH  Physical Exam BP 134/88   Pulse 70   Ht 6\' 3"  (1.905 m)   Wt 222 lb 6.4 oz (100.9 kg)   SpO2 97%   BMI 27.80 kg/m  Well developed and nourished in no acute distress HENT normal Neck supple with JVP-flat Carotids brisk and full without bruits Clear Irregularly irregular rate and rhythm with controlled ventricular response, no murmurs or gallops Abd-soft with active BS without  hepatomegaly No Clubbing cyanosis edema Skin-warm and dry A & Oriented  Grossly normal sensory and motor function  Electrocardiogram  Atrial fibrillation @ 70  -/08/37   Assessment and  Plan Persistent  atrial fibrillation  Sinus bradycardia  Nonischemic cardiomyopathy  Chest pain atypical  Congestive Heart Failure acute/chronic mixed   No bleeding  Euvolemic continue current meds  Patient symptoms are likely almost all related to his atrial fibrillation.  With a controlled ventricular response, do not think is necessary yet to be look at his LVEF.  We will undertake cardioversion.  Given that he is on long-term Coumadin, intermittent cardioversion as a strategy remains reasonable.  Last cardioversion was 4 years ago.  I reviewed this with him and his wife.  We spent more than 50% of our >25 min visit in face to face counseling regarding the above

## 2018-05-22 NOTE — H&P (View-Only) (Signed)
Patient Care Team: Mosie Lukes, MD as PCP - General (Family Medicine) Gatha Mayer, MD as Consulting Physician (Gastroenterology) Garvin Fila, MD as Consulting Physician (Neurology)   HPI  Tyler Sims is a 62 y.o. male Seen in followup for nonischemic cardiomyopathy with which he presented with a stroke and atrial fibrillation.  He is on Coumadin anticoagulation   It was initially hoped that this was secondary to the rate. However, despite restoration of sinus rhythm, he has had persistent left ventricular dysfunction    He also has significant fatigue this seems to be aggravated by long work hours.  . He notes that his heart rate runs in the   50s. He says his wife does not complaining much of his snoring.   This continues to be an issue. He has occasional   exercise associated chest heaviness. He has known normal catheterization 2012 which I reviewed today.   He is to say his last couple of months of increasing lassitude shortness of breath fatigue.  He has had vague fleeting chest pains.  He has had irregularity  Last INR was about 3  DATE TEST EF   1/1 LHC  CAs normal  1/12 Echo 20-25 %   1/14 Echo 40-45 %   8/17 Echo 45-50 %        Past Medical History:  Diagnosis Date  . Abnormal transaminases   . Acid reflux   . Bradycardia    nocturnal  . Colon polyps 02/07/2012   Dr. Silvano Rusk  . Elevated sed rate 07/06/2017  . HTN (hypertension) 06/02/2014  . Low back pain 06/15/2015  . Measles ([place])   . Mitral regurgitation   . Mumps   . Neuropathy 01/10/2016  . NICM (nonischemic cardiomyopathy) ([place])   . Persistent atrial fibrillation ([place]) 08/24/2010  . Preventative health care 12/04/2013   Colonsocopy May '13 - adenomatous polyps  Immunizations Tdap Jan '14   . Stroke ([place]) 08/24/2010  . Systolic CHF ([place])   . Urinary hesitancy 12/21/2014  . Varicella   . Ventricular tachycardia, non-sustained [facility])     Past Surgical History:  Procedure Laterality Date   . CARDIAC CATHETERIZATION    . CARDIOVERSION  08/15/2012   Procedure: CARDIOVERSION;  Surgeon: Deboraha Sprang, MD;  Location: [facility];  Service: Cardiovascular;  Laterality: N/A;  . COLONOSCOPY W/ BIOPSIES  02/07/2012   Dr. Silvano Rusk    Current Outpatient Medications  Medication Sig Dispense Refill  . atorvastatin (LIPITOR) 20 MG tablet Take 20 mg by mouth daily.    . cholecalciferol (VITAMIN D) 1000 units tablet Take 1 tablet (1,000 Units total) by mouth daily.    . COD LIVER OIL PO Take 1 capsule by mouth daily.     . Coenzyme Q10 (CO Q-10) 200 MG CAPS Take 200 mg by mouth daily.     . folic acid (FOLVITE) 1 MG tablet TAKE 1 TABLET BY MOUTH EVERY DAY 30 tablet 5  . gabapentin (NEURONTIN) 800 MG tablet Take 1 tablet (800 mg total) by mouth 3 (three) times daily. 90 tablet 5  . losartan (COZAAR) 25 MG tablet TAKE 1 TABLET BY MOUTH EVERY DAY 90 tablet 3  . metoprolol succinate (TOPROL-XL) 25 MG 24 hr tablet Take 1 tablet (25 mg total) by mouth daily. 90 tablet 1  . spironolactone (ALDACTONE) 25 MG tablet Take 12.5 mg by mouth daily.    Marland Kitchen thiamine (VITAMIN B-1) 100 MG tablet Take 1 tablet (100 mg total) by mouth  daily. 30 tablet 5  . warfarin (COUMADIN) 5 MG tablet TAKE ONE AND ONE-HALF TABLETS DAILY EXCEPT 1 TABLET ON MONDAY, WEDNESDAY, AND FRIDAYS OR AS DIRECTED BY ANTICOAGULATION CLINIC 120 tablet 1   No current facility-administered medications for this visit.     Allergies  Allergen Reactions  . Lisinopril Rash  . Simvastatin Rash    Review of Systems negative except from HPI and PMH  Physical Exam BP 134/88   Pulse 70   Ht 6\' 3"  (1.905 m)   Wt 222 lb 6.4 oz (100.9 kg)   SpO2 97%   BMI 27.80 kg/m  Well developed and nourished in no acute distress HENT normal Neck supple with JVP-flat Carotids brisk and full without bruits Clear Irregularly irregular rate and rhythm with controlled ventricular response, no murmurs or gallops Abd-soft with active BS without  hepatomegaly No Clubbing cyanosis edema Skin-warm and dry A & Oriented  Grossly normal sensory and motor function  Electrocardiogram  Atrial fibrillation @ 70  -/08/37   Assessment and  Plan Persistent  atrial fibrillation  Sinus bradycardia  Nonischemic cardiomyopathy  Chest pain atypical  Congestive Heart Failure acute/chronic mixed   No bleeding  Euvolemic continue current meds  Patient symptoms are likely almost all related to his atrial fibrillation.  With a controlled ventricular response, do not think is necessary yet to be look at his LVEF.  We will undertake cardioversion.  Given that he is on long-term Coumadin, intermittent cardioversion as a strategy remains reasonable.  Last cardioversion was 4 years ago.  I reviewed this with him and his wife.  We spent more than 50% of our >25 min visit in face to face counseling regarding the above

## 2018-05-23 LAB — CBC
Hematocrit: 46.3 % (ref 37.5–51.0)
Hemoglobin: 15.2 g/dL (ref 13.0–17.7)
MCH: 32 pg (ref 26.6–33.0)
MCHC: 32.8 g/dL (ref 31.5–35.7)
MCV: 98 fL — ABNORMAL HIGH (ref 79–97)
PLATELETS: 251 10*3/uL (ref 150–450)
RBC: 4.75 x10E6/uL (ref 4.14–5.80)
RDW: 13.7 % (ref 12.3–15.4)
WBC: 5.3 10*3/uL (ref 3.4–10.8)

## 2018-05-23 LAB — BASIC METABOLIC PANEL
BUN / CREAT RATIO: 16 (ref 10–24)
BUN: 18 mg/dL (ref 8–27)
CO2: 22 mmol/L (ref 20–29)
Calcium: 10.2 mg/dL (ref 8.6–10.2)
Chloride: 103 mmol/L (ref 96–106)
Creatinine, Ser: 1.13 mg/dL (ref 0.76–1.27)
GFR calc Af Amer: 81 mL/min/{1.73_m2} (ref >59)
GFR calc non Af Amer: 70 mL/min/{1.73_m2} (ref >59)
GLUCOSE: 74 mg/dL (ref 65–99)
POTASSIUM: 4.9 mmol/L (ref 3.5–5.2)
Sodium: 142 mmol/L (ref 134–144)

## 2018-05-23 LAB — PROTIME-INR
INR: 2.9 — AB (ref 0.8–1.2)
Prothrombin Time: 27.3 s — ABNORMAL HIGH (ref 9.1–12.0)

## 2018-05-27 ENCOUNTER — Encounter (HOSPITAL_COMMUNITY): Payer: Self-pay | Admitting: Anesthesiology

## 2018-05-27 NOTE — Anesthesia Preprocedure Evaluation (Addendum)
Anesthesia Evaluation  Patient identified by MRN, date of birth, ID band Patient awake    Reviewed: Allergy & Precautions, NPO status , Patient's Chart, lab work & pertinent test results, reviewed documented beta blocker date and time   Airway Mallampati: II  TM Distance: >3 FB Neck ROM: Full    Dental no notable dental hx. (+) Teeth Intact   Pulmonary neg pulmonary ROS,    Pulmonary exam normal breath sounds clear to auscultation       Cardiovascular hypertension, Pt. on medications and Pt. on home beta blockers + Peripheral Vascular Disease and +CHF  + dysrhythmias Atrial Fibrillation and Ventricular Tachycardia + Valvular Problems/Murmurs MR  Rhythm:Irregular Rate:Bradycardia  Non ischemic cardiomyopathy  Echo 04/24/2016 Left ventricle: A false tendon is present in the left ventricle.   The cavity size was normal. There was mild focal basal   hypertrophy of the septum. Systolic function was mildly reduced. The estimated ejection fraction was in the range of 45% to 50%. Diffuse hypokinesis. Features are consistent with a pseudonormal left ventricular filling pattern, with concomitant abnormal relaxation and increased filling pressure (grade 2 diastolic dysfunction). - Aortic valve: Trileaflet; normal thickness leaflets. There was no regurgitation. - Aortic root: The aortic root was normal in size. - Ascending aorta: The ascending aorta was normal in size. - Mitral valve: Structurally normal valve. There was mild   regurgitation. - Left atrium: The atrium was at the upper limits of normal in   size. - Right ventricle: Systolic function was normal. - Tricuspid valve: There was mild regurgitation. - Pulmonary arteries: Systolic pressure was within the normal range. - Inferior vena cava: The vessel was normal in size. - Pericardium, extracardiac: There was no pericardial effusion.   EKG 05/22/2016- Atrial fibrillation, non specific  ST-T wave changes   Neuro/Psych Peripheral neuropathy  Neuromuscular disease CVA, No Residual Symptoms negative psych ROS   GI/Hepatic Neg liver ROS, GERD  Medicated and Controlled,  Endo/Other  Hyperlipidemia  Renal/GU negative Renal ROS   Urinary hesitancy    Musculoskeletal negative musculoskeletal ROS (+)   Abdominal   Peds  Hematology Coumadin- last dose last pm   Anesthesia Other Findings   Reproductive/Obstetrics                          Anesthesia Physical Anesthesia Plan  ASA: III  Anesthesia Plan: General   Post-op Pain Management:    Induction: Intravenous  PONV Risk Score and Plan: Ondansetron  Airway Management Planned: Mask  Additional Equipment:   Intra-op Plan:   Post-operative Plan:   Informed Consent: I have reviewed the patients History and Physical, chart, labs and discussed the procedure including the risks, benefits and alternatives for the proposed anesthesia with the patient or authorized representative who has indicated his/her understanding and acceptance.   Dental advisory given  Plan Discussed with: CRNA  Anesthesia Plan Comments:        Anesthesia Quick Evaluation

## 2018-05-28 ENCOUNTER — Encounter (HOSPITAL_COMMUNITY)
Admission: RE | Disposition: A | Discharge status: Home / Self Care | Payer: Self-pay | Source: Ambulatory Visit | Attending: Internal Medicine

## 2018-05-28 ENCOUNTER — Other Ambulatory Visit: Payer: Self-pay

## 2018-05-28 ENCOUNTER — Encounter (HOSPITAL_COMMUNITY): Payer: Self-pay | Admitting: *Deleted

## 2018-05-28 ENCOUNTER — Ambulatory Visit (HOSPITAL_COMMUNITY): Payer: Managed Care, Other (non HMO) | Admitting: Anesthesiology

## 2018-05-28 ENCOUNTER — Ambulatory Visit (HOSPITAL_COMMUNITY)
Admission: RE | Admit: 2018-05-28 | Discharge: 2018-05-28 | Disposition: A | Discharge status: Home / Self Care | Payer: Managed Care, Other (non HMO) | Source: Ambulatory Visit | Attending: Internal Medicine | Admitting: Internal Medicine

## 2018-05-28 DIAGNOSIS — Z8673 Personal history of transient ischemic attack (TIA), and cerebral infarction without residual deficits: Secondary | ICD-10-CM | POA: Diagnosis not present

## 2018-05-28 DIAGNOSIS — R001 Bradycardia, unspecified: Secondary | ICD-10-CM | POA: Diagnosis not present

## 2018-05-28 DIAGNOSIS — I5020 Unspecified systolic (congestive) heart failure: Secondary | ICD-10-CM | POA: Diagnosis not present

## 2018-05-28 DIAGNOSIS — R0789 Other chest pain: Secondary | ICD-10-CM | POA: Diagnosis not present

## 2018-05-28 DIAGNOSIS — I110 Hypertensive heart disease with heart failure: Secondary | ICD-10-CM | POA: Insufficient documentation

## 2018-05-28 DIAGNOSIS — Z7901 Long term (current) use of anticoagulants: Secondary | ICD-10-CM | POA: Insufficient documentation

## 2018-05-28 DIAGNOSIS — G629 Polyneuropathy, unspecified: Secondary | ICD-10-CM | POA: Insufficient documentation

## 2018-05-28 DIAGNOSIS — I739 Peripheral vascular disease, unspecified: Secondary | ICD-10-CM | POA: Insufficient documentation

## 2018-05-28 DIAGNOSIS — E785 Hyperlipidemia, unspecified: Secondary | ICD-10-CM | POA: Diagnosis not present

## 2018-05-28 DIAGNOSIS — I4891 Unspecified atrial fibrillation: Secondary | ICD-10-CM | POA: Diagnosis not present

## 2018-05-28 DIAGNOSIS — I428 Other cardiomyopathies: Secondary | ICD-10-CM | POA: Insufficient documentation

## 2018-05-28 DIAGNOSIS — K219 Gastro-esophageal reflux disease without esophagitis: Secondary | ICD-10-CM | POA: Diagnosis not present

## 2018-05-28 DIAGNOSIS — I481 Persistent atrial fibrillation: Secondary | ICD-10-CM | POA: Diagnosis present

## 2018-05-28 DIAGNOSIS — I340 Nonrheumatic mitral (valve) insufficiency: Secondary | ICD-10-CM | POA: Diagnosis not present

## 2018-05-28 DIAGNOSIS — I480 Paroxysmal atrial fibrillation: Secondary | ICD-10-CM

## 2018-05-28 HISTORY — PX: CARDIOVERSION: SHX1299

## 2018-05-28 LAB — PROTIME-INR
INR: 2.87
Prothrombin Time: 29.8 seconds — ABNORMAL HIGH (ref 11.4–15.2)

## 2018-05-28 SURGERY — CARDIOVERSION
Anesthesia: General

## 2018-05-28 MED ORDER — LIDOCAINE 2% (20 MG/ML) 5 ML SYRINGE
INTRAMUSCULAR | Status: DC | PRN
  Administered 2018-05-28: 50 mg via INTRAVENOUS

## 2018-05-28 MED ORDER — ONDANSETRON HCL 4 MG/2ML IJ SOLN: 4.0000 mg | Freq: Once | INTRAMUSCULAR | Status: DC | PRN

## 2018-05-28 MED ORDER — SODIUM CHLORIDE 0.9 % IV SOLN
INTRAVENOUS | Status: DC
  Administered 2018-05-28: 07:00:00 via INTRAVENOUS

## 2018-05-28 MED ORDER — PROPOFOL 10 MG/ML IV BOLUS
INTRAVENOUS | Status: DC | PRN
  Administered 2018-05-28: 60 mg via INTRAVENOUS
  Administered 2018-05-28: 20 mg via INTRAVENOUS

## 2018-05-28 NOTE — Discharge Instructions (Signed)
Electrical Cardioversion, Care After °This sheet gives you information about how to care for yourself after your procedure. Your health care provider may also give you more specific instructions. If you have problems or questions, contact your health care provider. °What can I expect after the procedure? °After the procedure, it is common to have: °· Some redness on the skin where the shocks were given. ° °Follow these instructions at home: °· Do not drive for 24 hours if you were given a medicine to help you relax (sedative). °· Take over-the-counter and prescription medicines only as told by your health care provider. °· Ask your health care provider how to check your pulse. Check it often. °· Rest for 48 hours after the procedure or as told by your health care provider. °· Avoid or limit your caffeine use as told by your health care provider. °Contact a health care provider if: °· You feel like your heart is beating too quickly or your pulse is not regular. °· You have a serious muscle cramp that does not go away. °Get help right away if: °· You have discomfort in your chest. °· You are dizzy or you feel faint. °· You have trouble breathing or you are short of breath. °· Your speech is slurred. °· You have trouble moving an arm or leg on one side of your body. °· Your fingers or toes turn cold or blue. °This information is not intended to replace advice given to you by your health care provider. Make sure you discuss any questions you have with your health care provider. °Document Released: 06/26/2013 Document Revised: 04/08/2016 Document Reviewed: 03/11/2016 °Elsevier Interactive Patient Education © 2018 [employer] Inc[employer] ° °

## 2018-05-28 NOTE — Op Note (Addendum)
  Cardioversion  Patient sedated by anesthesia with IV lidocaine and propofol  With pads in AP position, pt cardioverted to Sinus Bradycardia with 200 J synchronized biphasic energy  Procedure was without complication   12 Lead EKG pending

## 2018-05-28 NOTE — Interval H&P Note (Signed)
History and Physical Interval Note:  05/28/2018 7:31 AM  Tyler Sims  has presented today for surgery, with the diagnosis of afib  The various methods of treatment have been discussed with the patient and family. After consideration of risks, benefits and other options for treatment, the patient has consented to  Procedure(s): CARDIOVERSION (N/A) as a surgical intervention .  The patient's history has been reviewed, patient examined, no change in status, stable for surgery.  I have reviewed the patient's chart and labs.  Questions were answered to the patient's satisfaction.     [name]

## 2018-05-28 NOTE — Transfer of Care (Signed)
Immediate Anesthesia Transfer of Care Note  Patient: Tyler Sims  Procedure(s) Performed: CARDIOVERSION (N/A )  Patient Location: Endoscopy Unit  Anesthesia Type:General  Level of Consciousness: awake and alert   Airway & Oxygen Therapy: Patient Spontanous Breathing  Post-op Assessment: Report given to RN and Post -op Vital signs reviewed and stable  Post vital signs: Reviewed and stable  Last Vitals:  Vitals Value Taken Time  BP    Temp    Pulse    Resp    SpO2      Last Pain:  Vitals:   05/28/18 0640  TempSrc: Oral  PainSc: 0-No pain         Complications: No apparent anesthesia complications

## 2018-05-28 NOTE — Anesthesia Postprocedure Evaluation (Signed)
Anesthesia Post Note  Patient: Tyler Sims  Procedure(s) Performed: CARDIOVERSION (N/A )     Patient location during evaluation: PACU Anesthesia Type: General Level of consciousness: awake and alert and oriented Pain management: pain level controlled Vital Signs Assessment: post-procedure vital signs reviewed and stable Respiratory status: spontaneous breathing, nonlabored ventilation and respiratory function stable Cardiovascular status: blood pressure returned to baseline and stable Postop Assessment: no apparent nausea or vomiting Anesthetic complications: no    Last Vitals:  Vitals:   05/28/18 0640 05/28/18 0820  BP: (!) 159/98 105/68  Pulse: 69 (!) 54  Resp: 17 17  Temp: 36.6 C 36.5 C  SpO2: 99% 99%    Last Pain:  Vitals:   05/28/18 0820  TempSrc: Oral  PainSc: 0-No pain                 Kalyse Meharg A.

## 2018-05-29 ENCOUNTER — Encounter (HOSPITAL_COMMUNITY): Payer: Self-pay | Admitting: Internal Medicine

## 2018-06-12 ENCOUNTER — Ambulatory Visit: Payer: Managed Care, Other (non HMO)

## 2018-06-19 ENCOUNTER — Ambulatory Visit: Payer: Managed Care, Other (non HMO) | Admitting: General Practice

## 2018-06-19 ENCOUNTER — Ambulatory Visit (HOSPITAL_COMMUNITY)
Admission: RE | Admit: 2018-06-19 | Discharge: 2018-06-19 | Disposition: A | Discharge status: Home / Self Care | Payer: Managed Care, Other (non HMO) | Source: Ambulatory Visit | Attending: Nurse Practitioner | Admitting: Nurse Practitioner

## 2018-06-19 ENCOUNTER — Encounter (HOSPITAL_COMMUNITY): Payer: Self-pay | Admitting: Nurse Practitioner

## 2018-06-19 VITALS — BP 132/78 | HR 60 | Ht 75.0 in | Wt 221.0 lb

## 2018-06-19 DIAGNOSIS — K219 Gastro-esophageal reflux disease without esophagitis: Secondary | ICD-10-CM | POA: Diagnosis not present

## 2018-06-19 DIAGNOSIS — Z7901 Long term (current) use of anticoagulants: Secondary | ICD-10-CM | POA: Diagnosis not present

## 2018-06-19 DIAGNOSIS — I509 Heart failure, unspecified: Secondary | ICD-10-CM | POA: Insufficient documentation

## 2018-06-19 DIAGNOSIS — I4819 Other persistent atrial fibrillation: Secondary | ICD-10-CM | POA: Insufficient documentation

## 2018-06-19 DIAGNOSIS — Z8673 Personal history of transient ischemic attack (TIA), and cerebral infarction without residual deficits: Secondary | ICD-10-CM | POA: Diagnosis not present

## 2018-06-19 DIAGNOSIS — I429 Cardiomyopathy, unspecified: Secondary | ICD-10-CM | POA: Insufficient documentation

## 2018-06-19 DIAGNOSIS — Z79899 Other long term (current) drug therapy: Secondary | ICD-10-CM | POA: Insufficient documentation

## 2018-06-19 DIAGNOSIS — Z888 Allergy status to other drugs, medicaments and biological substances status: Secondary | ICD-10-CM | POA: Insufficient documentation

## 2018-06-19 DIAGNOSIS — I480 Paroxysmal atrial fibrillation: Secondary | ICD-10-CM | POA: Diagnosis not present

## 2018-06-19 DIAGNOSIS — I110 Hypertensive heart disease with heart failure: Secondary | ICD-10-CM | POA: Diagnosis not present

## 2018-06-19 DIAGNOSIS — Z8249 Family history of ischemic heart disease and other diseases of the circulatory system: Secondary | ICD-10-CM | POA: Insufficient documentation

## 2018-06-19 DIAGNOSIS — I4891 Unspecified atrial fibrillation: Secondary | ICD-10-CM

## 2018-06-19 LAB — POCT INR: INR: 3.3 — AB (ref 2.0–3.0)

## 2018-06-19 NOTE — Progress Notes (Signed)
Primary Care Physician: Tyler Lukes, MD Referring Physician: Dr. Inetta Fermo Sims is a 62 y.o. male with a h/o paroxysmal afib and cardiomyopathy that is in the afib clinic today for f/u cardioversion. He remains in SR. He reports 3 cardioversion in 8 years of having PAF and the last cardioversion was 3 years ago. He feels much improved in SR  Today, he denies symptoms of palpitations, chest pain, shortness of breath, orthopnea, PND, lower extremity edema, dizziness, presyncope, syncope, or neurologic sequela. The patient is tolerating medications without difficulties and is otherwise without complaint today.   Past Medical History:  Diagnosis Date  . Abnormal transaminases   . Acid reflux   . Bradycardia    nocturnal  . Colon polyps 02/07/2012   Dr. Silvano Rusk  . Elevated sed rate 07/06/2017  . HTN (hypertension) 06/02/2014  . Low back pain 06/15/2015  . Measles ([place])   . Mitral regurgitation   . Mumps   . Neuropathy 01/10/2016  . NICM (nonischemic cardiomyopathy) ([place])   . Persistent atrial fibrillation 08/24/2010  . Preventative health care 12/04/2013   Colonsocopy May '13 - adenomatous polyps  Immunizations Tdap Jan '14   . Stroke ([place]) 08/24/2010  . Systolic CHF ([place])   . Urinary hesitancy 12/21/2014  . Varicella   . Ventricular tachycardia, non-sustained [facility])    Past Surgical History:  Procedure Laterality Date  . CARDIAC CATHETERIZATION    . CARDIOVERSION  08/15/2012   Procedure: CARDIOVERSION;  Surgeon: Deboraha Sprang, MD;  Location: [facility] ENDOSCOPY;  Service: Cardiovascular;  Laterality: N/A;  . CARDIOVERSION N/A 05/28/2018   Procedure: CARDIOVERSION;  Surgeon: Fay Records, MD;  Location: [facility] ENDOSCOPY;  Service: Cardiovascular;  Laterality: N/A;  . COLONOSCOPY W/ BIOPSIES  02/07/2012   Dr. Silvano Rusk    Current Outpatient Medications  Medication Sig Dispense Refill  . atorvastatin (LIPITOR) 20 MG tablet Take 20 mg by mouth daily.    . cholecalciferol  (VITAMIN D) 1000 units tablet Take 1 tablet (1,000 Units total) by mouth daily. (Patient taking differently: Take 1,000 Units by mouth every other day. )    . COD LIVER OIL PO Take 1 capsule by mouth daily.     . Coenzyme Q10 (CO Q-10) 200 MG CAPS Take 200 mg by mouth daily.     . diphenhydramine-acetaminophen (TYLENOL PM) 25-500 MG TABS tablet Take 2 tablets by mouth at bedtime as needed (pain / sleep).    . folic acid (FOLVITE) 1 MG tablet TAKE 1 TABLET BY MOUTH EVERY DAY 30 tablet 5  . gabapentin (NEURONTIN) 800 MG tablet Take 1 tablet (800 mg total) by mouth 3 (three) times daily. 90 tablet 5  . losartan (COZAAR) 25 MG tablet TAKE 1 TABLET BY MOUTH EVERY DAY 90 tablet 3  . metoprolol succinate (TOPROL-XL) 25 MG 24 hr tablet Take 1 tablet (25 mg total) by mouth daily. 90 tablet 1  . spironolactone (ALDACTONE) 25 MG tablet Take 12.5 mg by mouth daily.    Marland Kitchen thiamine (VITAMIN B-1) 100 MG tablet Take 1 tablet (100 mg total) by mouth daily. 30 tablet 5  . warfarin (COUMADIN) 5 MG tablet TAKE ONE AND ONE-HALF TABLETS DAILY EXCEPT 1 TABLET ON MONDAY, WEDNESDAY, AND FRIDAYS OR AS DIRECTED BY ANTICOAGULATION CLINIC (Patient taking differently: Take 5 mg by mouth See admin instructions. TAKE ONE AND ONE-HALF TABLETS (7.5 mg) DAILY EXCEPT 1 TABLET (5 mg) ON MONDAY, WEDNESDAY, AND FRIDAYS OR AS DIRECTED BY ANTICOAGULATION CLINIC) 120  tablet 1  . Chlorpheniramine-Acetaminophen (CORICIDIN HBP COLD/FLU PO) Take by mouth.     No current facility-administered medications for this encounter.     Allergies  Allergen Reactions  . Lisinopril Rash  . Simvastatin Rash    Social History   Socioeconomic History  . Marital status: Married    Spouse name: Tyler Sims  . Number of children: 2  . Years of education: 24  . Highest education level: Not on file  Occupational History  . Occupation: Health visitor: [employer]  . Financial resource strain: Not on file  . Food  insecurity:    Worry: Not on file    Inability: Not on file  . Transportation needs:    Medical: Not on file    Non-medical: Not on file  Tobacco Use  . Smoking status: Never Smoker  . Smokeless tobacco: Never Used  Substance and Sexual Activity  . Alcohol use: Yes    Alcohol/week: 0.0 standard drinks    Comment: occasional wine  . Drug use: No  . Sexual activity: Yes    Partners: Female    Comment: lives with wife, no dietary restrictions, except avoid dairy, uses Lactaid, works with [employer]  Lifestyle  . Physical activity:    Days per week: Not on file    Minutes per session: Not on file  . Stress: Not on file  Relationships  . Social connections:    Talks on phone: Not on file    Gets together: Not on file    Attends religious service: Not on file    Active member of club or organization: Not on file    Attends meetings of clubs or organizations: Not on file    Relationship status: Not on file  . Intimate partner violence:    Fear of current or ex partner: Not on file    Emotionally abused: Not on file    Physically abused: Not on file    Forced sexual activity: Not on file  Other Topics Concern  . Not on file  Social History Narrative   Patient is married with 2 children. Store Scientist, clinical (histocompatibility and immunogenetics) [employer]   1 daughter attorney in [place]   Other daughter [employer] grad - pursuing PhD in [place]   Patient is right handed.   Patient has 16 years of education.   Patient drinks 1-2 cups daily.    Family History  Problem Relation Age of Onset  . Alzheimer's disease Mother   . Lung cancer Brother        x 2, smokers  . Hypertension Brother   . Heart disease Brother   . Cancer Brother   . Lung cancer Brother   . Hypertension Brother   . Colon polyps Brother   . Hypertension Brother   . Hypertension Sister   . Neuropathy Sister   . Arthritis Sister   . Heart disease Father        CAD/MI  . Hypertension Father   . Mental illness Father        h/o depression  requiring hospitalization  . Hyperlipidemia Sister        x 2  . Hypertension Sister   . Arthritis Sister   . Obesity Sister   . Varicose Veins Daughter   . Birth defects Paternal Aunt     ROS- All systems are reviewed and negative except as per the HPI above  Physical Exam: Vitals:  06/19/18 0834  BP: 132/78  Pulse: 60  Weight: 100.2 kg  Height: 6\' 3"  (1.905 m)   Wt Readings from Last 3 Encounters:  06/19/18 100.2 kg  05/28/18 100.7 kg  05/22/18 100.9 kg    Labs: Lab Results  Component Value Date   NA 142 05/22/2018   K 4.9 05/22/2018   CL 103 05/22/2018   CO2 22 05/22/2018   GLUCOSE 74 05/22/2018   BUN 18 05/22/2018   CREATININE 1.13 05/22/2018   CALCIUM 10.2 05/22/2018   Lab Results  Component Value Date   INR 2.87 05/28/2018   Lab Results  Component Value Date   CHOL 124 01/04/2018   HDL 50.50 01/04/2018   LDLCALC 58 01/04/2018   TRIG 79.0 01/04/2018     GEN- The patient is well appearing, alert and oriented x 3 today.   Head- normocephalic, atraumatic Eyes-  Sclera clear, conjunctiva pink Ears- hearing intact Oropharynx- clear Neck- supple, no JVP Lymph- no cervical lymphadenopathy Lungs- Clear to ausculation bilaterally, normal work of breathing Heart- Regular rate and rhythm, no murmurs, rubs or gallops, PMI not laterally displaced GI- soft, NT, ND, + BS Extremities- no clubbing, cyanosis, or edema MS- no significant deformity or atrophy Skin- no rash or lesion Psych- euthymic mood, full affect Neuro- strength and sensation are intact  EKG-NSR at 60 bpm, pr int 180 ms, qrs int 84 ms, qtc 432 ms Epic records reviewed    Assessment and Plan: 1. Paroxysmal afib S/p successful cardioversion 9/9 and is maintaining SR Triggers discussed and no obvious triggers Continue metoprolol 25 mg qd  2. Chadsvasc score of at least 4 Continue warfarin  3. HTN Stable  F/u with Dr. Caryl Comes as scheduled afib clinic as needed  Tyler Sims,  [facility] [address] [place], [place] [address] [phone]

## 2018-06-19 NOTE — Patient Instructions (Signed)
Pre visit review using our clinic review tool, if applicable. No additional management support is needed unless otherwise documented below in the visit note.  Skip coumadin today (10/1) and then change dosage and take 1 tablet daily except 1 1/2 tablets on Mon/Wed/Fridays.  Re-check  In 4 weeks.

## 2018-06-21 ENCOUNTER — Other Ambulatory Visit: Payer: Self-pay | Admitting: Internal Medicine

## 2018-06-22 ENCOUNTER — Other Ambulatory Visit: Payer: Self-pay | Admitting: Family Medicine

## 2018-06-25 ENCOUNTER — Other Ambulatory Visit: Payer: Self-pay | Admitting: Internal Medicine

## 2018-07-10 ENCOUNTER — Ambulatory Visit (INDEPENDENT_AMBULATORY_CARE_PROVIDER_SITE_OTHER): Payer: Managed Care, Other (non HMO) | Admitting: Family Medicine

## 2018-07-10 VITALS — BP 132/78 | HR 64 | Temp 97.9°F | Resp 18 | Ht 75.0 in | Wt 223.2 lb

## 2018-07-10 DIAGNOSIS — Z0000 Encounter for general adult medical examination without abnormal findings: Secondary | ICD-10-CM | POA: Diagnosis not present

## 2018-07-10 DIAGNOSIS — I4891 Unspecified atrial fibrillation: Secondary | ICD-10-CM

## 2018-07-10 DIAGNOSIS — Z23 Encounter for immunization: Secondary | ICD-10-CM

## 2018-07-10 DIAGNOSIS — G629 Polyneuropathy, unspecified: Secondary | ICD-10-CM

## 2018-07-10 DIAGNOSIS — E782 Mixed hyperlipidemia: Secondary | ICD-10-CM

## 2018-07-10 DIAGNOSIS — R351 Nocturia: Secondary | ICD-10-CM

## 2018-07-10 DIAGNOSIS — E559 Vitamin D deficiency, unspecified: Secondary | ICD-10-CM | POA: Diagnosis not present

## 2018-07-10 DIAGNOSIS — I10 Essential (primary) hypertension: Secondary | ICD-10-CM

## 2018-07-10 DIAGNOSIS — R700 Elevated erythrocyte sedimentation rate: Secondary | ICD-10-CM

## 2018-07-10 DIAGNOSIS — Z86010 Personal history of colonic polyps: Secondary | ICD-10-CM

## 2018-07-10 LAB — COMPREHENSIVE METABOLIC PANEL
ALK PHOS: 66 U/L (ref 39–117)
ALT: 18 U/L (ref 0–53)
AST: 20 U/L (ref 0–37)
Albumin: 4.4 g/dL (ref 3.5–5.2)
BUN: 15 mg/dL (ref 6–23)
CO2: 28 mEq/L (ref 19–32)
Calcium: 9.6 mg/dL (ref 8.4–10.5)
Chloride: 102 mEq/L (ref 96–112)
Creatinine, Ser: 1.08 mg/dL (ref 0.40–1.50)
GFR: 89.42 mL/min (ref >60.00)
GLUCOSE: 94 mg/dL (ref 70–99)
POTASSIUM: 4.3 meq/L (ref 3.5–5.1)
Sodium: 139 mEq/L (ref 135–145)
TOTAL PROTEIN: 7.5 g/dL (ref 6.0–8.3)
Total Bilirubin: 1.4 mg/dL — ABNORMAL HIGH (ref 0.2–1.2)

## 2018-07-10 LAB — LIPID PANEL
CHOL/HDL RATIO: 3
CHOLESTEROL: 131 mg/dL (ref 0–200)
HDL: 48.9 mg/dL (ref >39.00)
LDL Cholesterol: 65 mg/dL (ref 0–99)
NonHDL: 82.51
TRIGLYCERIDES: 87 mg/dL (ref 0.0–149.0)
VLDL: 17.4 mg/dL (ref 0.0–40.0)

## 2018-07-10 LAB — VITAMIN D 25 HYDROXY (VIT D DEFICIENCY, FRACTURES): VITD: 53 ng/mL (ref 30.00–100.00)

## 2018-07-10 LAB — PSA: PSA: 1.78 ng/mL (ref 0.10–4.00)

## 2018-07-10 LAB — TSH: TSH: 1.47 u[IU]/mL (ref 0.35–4.50)

## 2018-07-10 NOTE — Assessment & Plan Note (Signed)
Doing well, next colonoscopy in 2021

## 2018-07-10 NOTE — Assessment & Plan Note (Addendum)
Patient encouraged to maintain heart healthy diet, regular exercise, adequate sleep. Consider daily probiotics. Take medications as prescribed. Agrees to Shingrix is willing to take chances with insurance coverage and he believes it will be covered

## 2018-07-10 NOTE — Assessment & Plan Note (Signed)
Encouraged heart healthy diet, increase exercise, avoid trans fats, consider a krill oil cap daily 

## 2018-07-10 NOTE — Assessment & Plan Note (Signed)
Supplement and monitor 

## 2018-07-10 NOTE — Assessment & Plan Note (Signed)
Persists but manageable

## 2018-07-10 NOTE — Progress Notes (Signed)
Subjective:    Patient ID: Tyler Sims, male    DOB: 1956-07-18, 62 y.o.   MRN: [id number]  No chief complaint on file.   HPI Patient is in today for a world for annual exam and follow-up on chronic medical problems.  He feels well today.  Unfortunately he did have to undergo a cardioversion for atrial fibrillation and tachycardia last month but tolerated the procedure well and has had no recurrent episodes.  They have had some trouble controlling his INR level since that time but are managing improve his numbers presently.  No other acute complaints.  No recent febrile illness or hospitalization.  He is trying to maintain a heart healthy diet and stay active.  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  His colonoscopy was last done in 2016 and showed mild diverticulosis but has had no GI complaints since that time. Denies CP/palp/SOB/HA/congestion/fevers/GI or GU c/o. Taking meds as prescribed  Past Medical History:  Diagnosis Date  . Abnormal transaminases   . Acid reflux   . Bradycardia    nocturnal  . Colon polyps 02/07/2012   Dr. Silvano Rusk  . Elevated sed rate 07/06/2017  . HTN (hypertension) 06/02/2014  . Low back pain 06/15/2015  . Measles ([place])   . Mitral regurgitation   . Mumps   . Neuropathy 01/10/2016  . NICM (nonischemic cardiomyopathy) ([place])   . Persistent atrial fibrillation 08/24/2010  . Preventative health care 12/04/2013   Colonsocopy May '13 - adenomatous polyps  Immunizations Tdap Jan '14   . Stroke ([place]) 08/24/2010  . Systolic CHF ([place])   . Urinary hesitancy 12/21/2014  . Varicella   . Ventricular tachycardia, non-sustained [facility])     Past Surgical History:  Procedure Laterality Date  . CARDIAC CATHETERIZATION    . CARDIOVERSION  08/15/2012   Procedure: CARDIOVERSION;  Surgeon: Deboraha Sprang, MD;  Location: [facility] ENDOSCOPY;  Service:  Cardiovascular;  Laterality: N/A;  . CARDIOVERSION N/A 05/28/2018   Procedure: CARDIOVERSION;  Surgeon: Fay Records, MD;  Location: [facility];  Service: Cardiovascular;  Laterality: N/A;  . COLONOSCOPY W/ BIOPSIES  02/07/2012   Dr. Silvano Rusk    Family History  Problem Relation Age of Onset  . Alzheimer's disease Mother   . Lung cancer Brother        x 2, smokers  . Hypertension Brother   . Heart disease Brother   . Cancer Brother   . Lung cancer Brother   . Hypertension Brother   . Colon polyps Brother   . Hypertension Brother   . Hypertension Sister   . Neuropathy Sister   . Arthritis Sister   . Heart disease Father        CAD/MI  . Hypertension Father   . Mental illness Father        h/o depression requiring hospitalization  . Hyperlipidemia Sister        x 2  . Hypertension Sister   . Arthritis Sister   . Obesity Sister   . Varicose Veins Daughter   . Birth defects Paternal Aunt     Social History   Socioeconomic History  . Marital status: Married    Spouse name: Charlett Nose  . Number of children: 2  . Years of education: 36  . Highest education level: Not on file  Occupational History  . 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 Financial resource strain: Not on file  . Food insecurity:    Worry: Not on file    Inability: Not on file  . Transportation needs:    Medical: Not on file    Non-medical: Not on file  Tobacco Use  . Smoking status: Never Smoker  . Smokeless tobacco: Never Used  Substance and Sexual Activity  . Alcohol use: Yes    Alcohol/week: 0.0 standard drinks    Comment: occasional wine  . Drug use: No  . Sexual activity: Yes    Partners: Female    Comment: lives with wife, no dietary restrictions, except avoid dairy, uses Lactaid, works with [employer]  Lifestyle  . Physical activity:    Days per week: Not on file    Minutes per session: Not on file  . Stress: Not on file  Relationships   . Social connections:    Talks on phone: Not on file    Gets together: Not on file    Attends religious service: Not on file    Active member of club or organization: Not on file    Attends meetings of clubs or organizations: Not on file    Relationship status: Not on file  . Intimate partner violence:    Fear of current or ex partner: Not on file    Emotionally abused: Not on file    Physically abused: Not on file    Forced sexual activity: Not on file  Other Topics Concern  . Not on file  Social History Narrative   Patient is married with 2 children. Store Scientist, clinical (histocompatibility and immunogenetics) [employer]   1 daughter attorney in [place]   Other daughter [employer] grad - pursuing PhD in [place]   Patient is right handed.   Patient has 16 years of education.   Patient drinks 1-2 cups daily.    Outpatient Medications Prior to Visit  Medication Sig Dispense Refill  . atorvastatin (LIPITOR) 20 MG tablet Take 20 mg by mouth daily.    . Chlorpheniramine-Acetaminophen (CORICIDIN HBP COLD/FLU PO) Take by mouth.    . cholecalciferol (VITAMIN D) 1000 units tablet Take 1 tablet (1,000 Units total) by mouth daily. (Patient taking differently: Take 1,000 Units by mouth every other day. )    . COD LIVER OIL PO Take 1 capsule by mouth daily.     . Coenzyme Q10 (CO Q-10) 200 MG CAPS Take 200 mg by mouth daily.     . diphenhydramine-acetaminophen (TYLENOL PM) 25-500 MG TABS tablet Take 2 tablets by mouth at bedtime as needed (pain / sleep).    . folic acid (FOLVITE) 1 MG tablet TAKE 1 TABLET BY MOUTH EVERY DAY 90 tablet 1  . gabapentin (NEURONTIN) 800 MG tablet Take 1 tablet (800 mg total) by mouth 3 (three) times daily. 90 tablet 5  . losartan (COZAAR) 25 MG tablet TAKE 1 TABLET BY MOUTH EVERY DAY 90 tablet 3  . metoprolol succinate (TOPROL-XL) 25 MG 24 hr tablet TAKE 1 TABLET DAILY 90 tablet 4  . spironolactone (ALDACTONE) 25 MG tablet TAKE ONE-HALF (1/2) TABLET DAILY 45 tablet 3  . thiamine (VITAMIN B-1) 100 MG tablet Take  1 tablet (100 mg total) by mouth daily. 30 tablet 5  . warfarin (COUMADIN) 5 MG tablet TAKE ONE AND ONE-HALF TABLETS DAILY EXCEPT 1 TABLET ON MONDAY, WEDNESDAY, AND FRIDAYS OR AS DIRECTED BY ANTICOAGULATION CLINIC (Patient taking differently: Take 5  mg by mouth See admin instructions. TAKE ONE AND ONE-HALF TABLETS (7.5 mg) DAILY EXCEPT 1 TABLET (5 mg) ON MONDAY, WEDNESDAY, AND FRIDAYS OR AS DIRECTED BY ANTICOAGULATION CLINIC) 120 tablet 1   No facility-administered medications prior to visit.     Allergies  Allergen Reactions  . Lisinopril Rash  . Simvastatin Rash    Review of Systems  Constitutional: Negative for chills, fever and malaise/fatigue.  HENT: Negative for congestion and hearing loss.   Eyes: Negative for discharge.  Respiratory: Negative for cough, sputum production and shortness of breath.   Cardiovascular: Negative for chest pain, palpitations and leg swelling.  Gastrointestinal: Negative for abdominal pain, blood in stool, constipation, diarrhea, heartburn, nausea and vomiting.  Genitourinary: Negative for dysuria, frequency, hematuria and urgency.  Musculoskeletal: Negative for back pain, falls and myalgias.  Skin: Negative for rash.  Neurological: Positive for sensory change. Negative for dizziness, loss of consciousness, weakness and headaches.  Endo/Heme/Allergies: Negative for environmental allergies. Does not bruise/bleed easily.  Psychiatric/Behavioral: Negative for depression and suicidal ideas. The patient is not nervous/anxious and does not have insomnia.        Objective:    Physical Exam  Constitutional: He is oriented to person, place, and time. He appears well-developed and well-nourished. No distress.  HENT:  Head: Normocephalic and atraumatic.  Right Ear: External ear normal.  Left Ear: External ear normal.  Nose: Nose normal.  Mouth/Throat: Oropharynx is clear and moist.  Eyes: Pupils are equal, round, and reactive to light. EOM are normal. Right  eye exhibits no discharge. Left eye exhibits no discharge.  Neck: Normal range of motion. Neck supple.  Cardiovascular: Normal rate, regular rhythm and normal heart sounds.  No murmur heard. Pulmonary/Chest: Effort normal and breath sounds normal. He has no wheezes.  Abdominal: Soft. Bowel sounds are normal. He exhibits no distension and no mass. There is no tenderness. There is no guarding.  Musculoskeletal: He exhibits no edema, tenderness or deformity.  Neurological: He is alert and oriented to person, place, and time. He displays normal reflexes. A cranial nerve deficit is present. No sensory deficit. Coordination normal.  Skin: Skin is warm and dry.  Psychiatric: He has a normal mood and affect.  Nursing note and vitals reviewed.   BP 132/78 (BP Location: Left Arm, Patient Position: Sitting, Cuff Size: Normal)   Pulse 64   Temp 97.9 F (36.6 C) (Oral)   Resp 18   Ht 6\' 3"  (1.905 m)   Wt 223 lb 3.2 oz (101.2 kg)   SpO2 96%   BMI 27.90 kg/m  Wt Readings from Last 3 Encounters:  07/10/18 223 lb 3.2 oz (101.2 kg)  06/19/18 221 lb (100.2 kg)  05/28/18 222 lb (100.7 kg)     Lab Results  Component Value Date   WBC 5.3 05/22/2018   HGB 15.2 05/22/2018   HCT 46.3 05/22/2018   PLT 251 05/22/2018   GLUCOSE 74 05/22/2018   CHOL 124 01/04/2018   TRIG 79.0 01/04/2018   HDL 50.50 01/04/2018   LDLDIRECT 110.4 10/03/2012   LDLCALC 58 01/04/2018   ALT 24 01/04/2018   AST 26 01/04/2018   NA 142 05/22/2018   K 4.9 05/22/2018   CL 103 05/22/2018   CREATININE 1.13 05/22/2018   BUN 18 05/22/2018   CO2 22 05/22/2018   TSH 1.81 07/06/2017   PSA 1.68 07/06/2017   INR 3.3 (A) 06/19/2018   HGBA1C  08/28/2010    4.7 (NOTE)  According to the ADA Clinical Practice Recommendations for 2011, when HbA1c is used as a screening test:   >=6.5%   Diagnostic of Diabetes Mellitus           (if abnormal result  is confirmed)   5.7-6.4%   Increased risk of developing Diabetes Mellitus  References:Diagnosis and Classification of Diabetes Mellitus,Diabetes IFOY,7741,28(NOMVE 1):S62-S69 and Standards of Medical Care in         Diabetes - 2011,Diabetes HMCN,4709,62  (Suppl 1):S11-S61.    Lab Results  Component Value Date   TSH 1.81 07/06/2017   Lab Results  Component Value Date   WBC 5.3 05/22/2018   HGB 15.2 05/22/2018   HCT 46.3 05/22/2018   MCV 98 (H) 05/22/2018   PLT 251 05/22/2018   Lab Results  Component Value Date   NA 142 05/22/2018   K 4.9 05/22/2018   CO2 22 05/22/2018   GLUCOSE 74 05/22/2018   BUN 18 05/22/2018   CREATININE 1.13 05/22/2018   BILITOT 0.9 01/04/2018   ALKPHOS 69 01/04/2018   AST 26 01/04/2018   ALT 24 01/04/2018   PROT 7.4 01/04/2018   ALBUMIN 4.1 01/04/2018   CALCIUM 10.2 05/22/2018   GFR 88.63 01/04/2018   Lab Results  Component Value Date   CHOL 124 01/04/2018   Lab Results  Component Value Date   HDL 50.50 01/04/2018   Lab Results  Component Value Date   LDLCALC 58 01/04/2018   Lab Results  Component Value Date   TRIG 79.0 01/04/2018   Lab Results  Component Value Date   CHOLHDL 2 01/04/2018   Lab Results  Component Value Date   HGBA1C  08/28/2010    4.7 (NOTE)                                                                       According to the ADA Clinical Practice Recommendations for 2011, when HbA1c is used as a screening test:   >=6.5%   Diagnostic of Diabetes Mellitus           (if abnormal result  is confirmed)  5.7-6.4%   Increased risk of developing Diabetes Mellitus  References:Diagnosis and Classification of Diabetes Mellitus,Diabetes EZMO,2947,65(YYTKP 1):S62-S69 and Standards of Medical Care in         Diabetes - 2011,Diabetes Care,2011,34  (Suppl 1):S11-S61.       Assessment & Plan:   Problem List Items Addressed This Visit    History of colonic polyps    Doing well, next colonoscopy in 2021      Hyperlipidemia    Encouraged  heart healthy diet, increase exercise, avoid trans fats, consider a krill oil cap daily      Atrial fibrillation ([place])    Feeling well s/p cardioversion in September, had follow up with Afib clinic. Tolerating Coumadin but has been somewhat labile coumadin clinic is following      Preventative health care    Patient encouraged to maintain heart healthy diet, regular exercise, adequate sleep. Consider daily probiotics. Take medications as prescribed. Agrees to Shingrix is willing to take chances with insurance coverage and he believes it will be covered      HTN (hypertension)    Well controlled, no changes to meds. Encouraged  heart healthy diet such as the DASH diet and exercise as tolerated.       Vitamin D deficiency    Supplement and monitor      Neuropathy    Persists but manageable      Elevated sed rate    Check rate         I am having [name] maintain his COD LIVER OIL PO, Co Q-10, cholecalciferol, thiamine, warfarin, gabapentin, atorvastatin, diphenhydramine-acetaminophen, Chlorpheniramine-Acetaminophen (CORICIDIN HBP COLD/FLU PO), losartan, metoprolol succinate, folic acid, and spironolactone.  No orders of the defined types were placed in this encounter.    Penni Homans, MD

## 2018-07-10 NOTE — Addendum Note (Signed)
Addended by: Magdalene Molly A on: 07/10/2018 10:23 AM   Modules accepted: Orders

## 2018-07-10 NOTE — Assessment & Plan Note (Signed)
Feeling well s/p cardioversion in September, had follow up with Afib clinic. Tolerating Coumadin but has been somewhat labile coumadin clinic is following

## 2018-07-10 NOTE — Assessment & Plan Note (Signed)
Check rate

## 2018-07-10 NOTE — Assessment & Plan Note (Signed)
Well controlled, no changes to meds. Encouraged heart healthy diet such as the DASH diet and exercise as tolerated.  °

## 2018-07-10 NOTE — Patient Instructions (Signed)
Consider adding fiber supplements, hydrate with 60-80 oz of clear fluidss, exercise    Preventive Care 40-64 Years, Male Preventive care refers to lifestyle choices and visits with your health care provider that can promote health and wellness. What does preventive care include?  A yearly physical exam. This is also called an annual well check.  Dental exams once or twice a year.  Routine eye exams. Ask your health care provider how often you should have your eyes checked.  Personal lifestyle choices, including: ? Daily care of your teeth and gums. ? Regular physical activity. ? Eating a healthy diet. ? Avoiding tobacco and drug use. ? Limiting alcohol use. ? Practicing safe sex. ? Taking low-dose aspirin every day starting at age 64. What happens during an annual well check? The services and screenings done by your health care provider during your annual well check will depend on your age, overall health, lifestyle risk factors, and family history of disease. Counseling Your health care provider may ask you questions about your:  Alcohol use.  Tobacco use.  Drug use.  Emotional well-being.  Home and relationship well-being.  Sexual activity.  Eating habits.  Work and work Statistician.  Screening You may have the following tests or measurements:  Height, weight, and BMI.  Blood pressure.  Lipid and cholesterol levels. These may be checked every 5 years, or more frequently if you are over 75 years old.  Skin check.  Lung cancer screening. You may have this screening every year starting at age 48 if you have a 30-pack-year history of smoking and currently smoke or have quit within the past 15 years.  Fecal occult blood test (FOBT) of the stool. You may have this test every year starting at age 29.  Flexible sigmoidoscopy or colonoscopy. You may have a sigmoidoscopy every 5 years or a colonoscopy every 10 years starting at age 25.  Prostate cancer screening.  Recommendations will vary depending on your family history and other risks.  Hepatitis C blood test.  Hepatitis B blood test.  Sexually transmitted disease (STD) testing.  Diabetes screening. This is done by checking your blood sugar (glucose) after you have not eaten for a while (fasting). You may have this done every 1-3 years.  Discuss your test results, treatment options, and if necessary, the need for more tests with your health care provider. Vaccines Your health care provider may recommend certain vaccines, such as:  Influenza vaccine. This is recommended every year.  Tetanus, diphtheria, and acellular pertussis (Tdap, Td) vaccine. You may need a Td booster every 10 years.  Varicella vaccine. You may need this if you have not been vaccinated.  Zoster vaccine. You may need this after age 70.  Measles, mumps, and rubella (MMR) vaccine. You may need at least one dose of MMR if you were born in 1957 or later. You may also need a second dose.  Pneumococcal 13-valent conjugate (PCV13) vaccine. You may need this if you have certain conditions and have not been vaccinated.  Pneumococcal polysaccharide (PPSV23) vaccine. You may need one or two doses if you smoke cigarettes or if you have certain conditions.  Meningococcal vaccine. You may need this if you have certain conditions.  Hepatitis A vaccine. You may need this if you have certain conditions or if you travel or work in places where you may be exposed to hepatitis A.  Hepatitis B vaccine. You may need this if you have certain conditions or if you travel or work in places  where you may be exposed to hepatitis B.  Haemophilus influenzae type b (Hib) vaccine. You may need this if you have certain risk factors.  Talk to your health care provider about which screenings and vaccines you need and how often you need them. This information is not intended to replace advice given to you by your health care provider. Make sure you  discuss any questions you have with your health care provider. Document Released: 10/02/2015 Document Revised: 05/25/2016 Document Reviewed: 07/07/2015 Elsevier Interactive Patient Education  [employer].

## 2018-07-17 ENCOUNTER — Ambulatory Visit: Payer: Managed Care, Other (non HMO) | Admitting: General Practice

## 2018-07-17 DIAGNOSIS — Z7901 Long term (current) use of anticoagulants: Secondary | ICD-10-CM | POA: Diagnosis not present

## 2018-07-17 DIAGNOSIS — I4891 Unspecified atrial fibrillation: Secondary | ICD-10-CM

## 2018-07-17 LAB — POCT INR: INR: 2.3 (ref 2.0–3.0)

## 2018-07-17 NOTE — Patient Instructions (Addendum)
Pre visit review using our clinic review tool, if applicable. No additional management support is needed unless otherwise documented below in the visit note.  Continue to take 1 tablet daily except 1 1/2 tablets on Mon/Wed/Fridays.  Re-check  In 4 weeks.  

## 2018-08-14 ENCOUNTER — Ambulatory Visit: Payer: Managed Care, Other (non HMO) | Admitting: General Practice

## 2018-08-14 DIAGNOSIS — Z7901 Long term (current) use of anticoagulants: Secondary | ICD-10-CM | POA: Diagnosis not present

## 2018-08-14 DIAGNOSIS — I4891 Unspecified atrial fibrillation: Secondary | ICD-10-CM

## 2018-08-14 LAB — POCT INR: INR: 2.2 (ref 2.0–3.0)

## 2018-08-14 NOTE — Patient Instructions (Addendum)
Pre visit review using our clinic review tool, if applicable. No additional management support is needed unless otherwise documented below in the visit note.  Continue to take 1 tablet daily except 1 1/2 tablets on Mon/Wed/Fridays.  Re-check  In 4 weeks.  

## 2018-08-16 ENCOUNTER — Other Ambulatory Visit: Payer: Self-pay | Admitting: Family Medicine

## 2018-08-16 DIAGNOSIS — G629 Polyneuropathy, unspecified: Secondary | ICD-10-CM

## 2018-08-25 ENCOUNTER — Other Ambulatory Visit: Payer: Self-pay | Admitting: Family Medicine

## 2018-09-14 ENCOUNTER — Other Ambulatory Visit: Payer: Self-pay | Admitting: Family Medicine

## 2018-09-14 DIAGNOSIS — G629 Polyneuropathy, unspecified: Secondary | ICD-10-CM

## 2018-09-25 ENCOUNTER — Ambulatory Visit: Payer: Managed Care, Other (non HMO) | Admitting: General Practice

## 2018-09-25 DIAGNOSIS — Z7901 Long term (current) use of anticoagulants: Secondary | ICD-10-CM

## 2018-09-25 LAB — POCT INR: INR: 3.2 — AB (ref 2.0–3.0)

## 2018-09-25 NOTE — Patient Instructions (Addendum)
Pre visit review using our clinic review tool, if applicable. No additional management support is needed unless otherwise documented below in the visit note.  Continue to take 1 tablet daily except 1 1/2 tablets on Mon/Wed/Fridays.  Re-check  In 4 weeks.  

## 2018-10-30 ENCOUNTER — Ambulatory Visit: Payer: Managed Care, Other (non HMO) | Admitting: General Practice

## 2018-10-30 DIAGNOSIS — Z7901 Long term (current) use of anticoagulants: Secondary | ICD-10-CM | POA: Diagnosis not present

## 2018-10-30 DIAGNOSIS — I4891 Unspecified atrial fibrillation: Secondary | ICD-10-CM

## 2018-10-30 LAB — POCT INR: INR: 2.6 (ref 2.0–3.0)

## 2018-10-30 NOTE — Patient Instructions (Addendum)
Pre visit review using our clinic review tool, if applicable. No additional management support is needed unless otherwise documented below in the visit note.  Continue to take 1 tablet daily except 1 1/2 tablets on Mon/Wed/Fridays.  Re-check  In 4 weeks.  

## 2018-11-27 ENCOUNTER — Ambulatory Visit: Payer: Managed Care, Other (non HMO) | Admitting: General Practice

## 2018-11-27 DIAGNOSIS — Z7901 Long term (current) use of anticoagulants: Secondary | ICD-10-CM | POA: Diagnosis not present

## 2018-11-27 DIAGNOSIS — I4891 Unspecified atrial fibrillation: Secondary | ICD-10-CM

## 2018-11-27 LAB — POCT INR: INR: 2.5 (ref 2.0–3.0)

## 2018-11-27 NOTE — Patient Instructions (Addendum)
Pre visit review using our clinic review tool, if applicable. No additional management support is needed unless otherwise documented below in the visit note.  Continue to take 1 tablet daily except 1 1/2 tablets on Mon/Wed/Fridays.  Re-check  In 4 weeks.  

## 2018-12-25 ENCOUNTER — Ambulatory Visit: Payer: Managed Care, Other (non HMO) | Admitting: General Practice

## 2018-12-25 ENCOUNTER — Other Ambulatory Visit: Payer: Self-pay

## 2018-12-25 DIAGNOSIS — Z7901 Long term (current) use of anticoagulants: Secondary | ICD-10-CM | POA: Diagnosis not present

## 2018-12-25 DIAGNOSIS — I4891 Unspecified atrial fibrillation: Secondary | ICD-10-CM

## 2018-12-25 LAB — POCT INR: INR: 2.9 (ref 2.0–3.0)

## 2018-12-25 NOTE — Patient Instructions (Addendum)
Pre visit review using our clinic review tool, if applicable. No additional management support is needed unless otherwise documented below in the visit note.  Continue to take 1 tablet daily except 1 1/2 tablets on Mon/Wed/Fridays.  Re-check  In 4 weeks.

## 2018-12-29 ENCOUNTER — Other Ambulatory Visit: Payer: Self-pay | Admitting: Family Medicine

## 2018-12-31 ENCOUNTER — Encounter: Payer: Self-pay | Admitting: Family Medicine

## 2018-12-31 NOTE — Telephone Encounter (Signed)
Spoke with pt and advised him that we will keep the visit on 4/28 but it will be a Virtual Visit using [email or web address] platform and he and PCP can discuss if nurse visit for 2nd Shingrix vaccine is appropriate at that time. Also advised pt to check BP / pulse with home monitor before appt and he is agreeable.

## 2019-01-15 ENCOUNTER — Ambulatory Visit (INDEPENDENT_AMBULATORY_CARE_PROVIDER_SITE_OTHER): Payer: Managed Care, Other (non HMO) | Admitting: Family Medicine

## 2019-01-15 ENCOUNTER — Other Ambulatory Visit: Payer: Self-pay

## 2019-01-15 DIAGNOSIS — I4891 Unspecified atrial fibrillation: Secondary | ICD-10-CM

## 2019-01-15 DIAGNOSIS — G629 Polyneuropathy, unspecified: Secondary | ICD-10-CM | POA: Diagnosis not present

## 2019-01-15 DIAGNOSIS — I10 Essential (primary) hypertension: Secondary | ICD-10-CM | POA: Diagnosis not present

## 2019-01-15 DIAGNOSIS — E782 Mixed hyperlipidemia: Secondary | ICD-10-CM | POA: Diagnosis not present

## 2019-01-15 DIAGNOSIS — Z7901 Long term (current) use of anticoagulants: Secondary | ICD-10-CM

## 2019-01-15 NOTE — Assessment & Plan Note (Signed)
Asymptomatic and rate controlled

## 2019-01-15 NOTE — Assessment & Plan Note (Signed)
Well controlled, no changes to meds. Encouraged heart healthy diet such as the DASH diet and exercise as tolerated.  °

## 2019-01-15 NOTE — Assessment & Plan Note (Signed)
Following with cardiology to have INR monitored

## 2019-01-15 NOTE — Assessment & Plan Note (Signed)
Not notably improved as he has been off his feet more at work

## 2019-01-15 NOTE — Assessment & Plan Note (Signed)
Encouraged heart healthy diet, increase exercise, avoid trans fats, consider a krill oil cap daily 

## 2019-01-15 NOTE — Progress Notes (Signed)
Virtual Visit via Video Note  I connected with Tyler Sims on 01/15/19 at  9:20 AM EDT by a video enabled telemedicine application and verified that I am speaking with the correct person using two identifiers.   I discussed the limitations of evaluation and management by telemedicine and the availability of in person appointments. The patient expressed understanding and agreed to proceed. Tyler Sims was able to get patient set up on video platform.    Subjective:    Patient ID: Tyler Sims, male    DOB: Apr 11, 1956, 63 y.o.   MRN: [id number]  No chief complaint on file.   HPI Patient is in today for follow up on chronic medical concerns. 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 No recent febrile illness or hospitalizations, his peripheral neuropathy is notable still but not worse. Denies CP/palp/SOB/HA/congestion/fevers/GI or GU c/o. Taking meds as prescribed  Past Medical History:  Diagnosis Date  . Abnormal transaminases   . Acid reflux   . Bradycardia    nocturnal  . Colon polyps 02/07/2012   Dr. Silvano Rusk  . Elevated sed rate 07/06/2017  . HTN (hypertension) 06/02/2014  . Low back pain 06/15/2015  . Measles ([place])   . Mitral regurgitation   . Mumps   . Neuropathy 01/10/2016  . NICM (nonischemic cardiomyopathy) ([place])   . Persistent atrial fibrillation 08/24/2010  . Preventative health care 12/04/2013   Colonsocopy May '13 - adenomatous polyps  Immunizations Tdap Jan '14   . Stroke ([place]) 08/24/2010  . Systolic CHF ([place])   . Urinary hesitancy 12/21/2014  . Varicella   . Ventricular tachycardia, non-sustained [facility]     Past Surgical History:  Procedure Laterality Date  . CARDIAC CATHETERIZATION    . CARDIOVERSION  08/15/2012   Procedure: CARDIOVERSION;  Surgeon: Deboraha Sprang, MD;  Location: [facility] ENDOSCOPY;  Service: Cardiovascular;   Laterality: N/A;  . CARDIOVERSION N/A 05/28/2018   Procedure: CARDIOVERSION;  Surgeon: Fay Records, MD;  Location: [place];  Service: Cardiovascular;  Laterality: N/A;  . COLONOSCOPY W/ BIOPSIES  02/07/2012   Dr. Silvano Rusk    Family History  Problem Relation Age of Onset  . Alzheimer's disease Mother   . Lung cancer Brother        x 2, smokers  . Hypertension Brother   . Heart disease Brother   . Cancer Brother   . Lung cancer Brother   . Hypertension Brother   . Colon polyps Brother   . Hypertension Brother   . Hypertension Sister   . Neuropathy Sister   . Arthritis Sister   . Heart disease Father        CAD/MI  . Hypertension Father   . Mental illness Father        h/o depression requiring hospitalization  . Hyperlipidemia Sister        x 2  . Hypertension Sister   . Arthritis Sister   . Obesity Sister   . Varicose Veins Daughter   . Birth defects Paternal Aunt     Social History   Socioeconomic History  . Marital status: Married    Spouse name: Tyler Sims  . Number of children: 2  . Years of education: 72  . Highest education level: Not on file  Occupational History  . Occupation: Health visitor: [employer]  Social Needs  . Financial resource strain: Not on file  . Food insecurity:    Worry: Not on file    Inability: Not on file  . Transportation needs:    Medical: Not on file    Non-medical: Not on file  Tobacco Use  . Smoking status: Never Smoker  . Smokeless tobacco: Never Used  Substance and Sexual Activity  . Alcohol use: Yes    Alcohol/week: 0.0 standard drinks    Comment: occasional wine  . Drug use: No  . Sexual activity: Yes    Partners: Female    Comment: lives with wife, no dietary restrictions, except avoid dairy, uses Lactaid, works with [employer]  Lifestyle  . Physical activity:    Days per week: Not on file    Minutes per session: Not on file  . Stress: Not on file  Relationships  . Social  connections:    Talks on phone: Not on file    Gets together: Not on file    Attends religious service: Not on file    Active member of club or organization: Not on file    Attends meetings of clubs or organizations: Not on file    Relationship status: Not on file  . Intimate partner violence:    Fear of current or ex partner: Not on file    Emotionally abused: Not on file    Physically abused: Not on file    Forced sexual activity: Not on file  Other Topics Concern  . Not on file  Social History Narrative   Patient is married with 2 children. 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   Patient has 16 years of education.   Patient drinks 1-2 cups daily.    Outpatient Medications Prior to Visit  Medication Sig Dispense Refill  . atorvastatin (LIPITOR) 20 MG tablet Take 20 mg by mouth daily.    . Chlorpheniramine-Acetaminophen (CORICIDIN HBP COLD/FLU PO) Take by mouth.    . cholecalciferol (VITAMIN D) 1000 units tablet Take 1 tablet (1,000 Units total) by mouth daily. (Patient taking differently: Take 1,000 Units by mouth every other day. )    . COD LIVER OIL PO Take 1 capsule by mouth daily.     . Coenzyme Q10 (CO Q-10) 200 MG CAPS Take 200 mg by mouth daily.     . diphenhydramine-acetaminophen (TYLENOL PM) 25-500 MG TABS tablet Take 2 tablets by mouth at bedtime as needed (pain / sleep).    . folic acid (FOLVITE) 1 MG tablet TAKE 1 TABLET BY MOUTH EVERY DAY 90 tablet 1  . gabapentin (NEURONTIN) 800 MG tablet TAKE 1 TABLET BY MOUTH THREE TIMES A DAY 270 tablet 2  . losartan (COZAAR) 25 MG tablet TAKE 1 TABLET BY MOUTH EVERY DAY 90 tablet 3  . metoprolol succinate (TOPROL-XL) 25 MG 24 hr tablet TAKE 1 TABLET DAILY 90 tablet 4  . spironolactone (ALDACTONE) 25 MG tablet TAKE ONE-HALF (1/2) TABLET DAILY 45 tablet 3  . thiamine (VITAMIN B-1) 100 MG tablet Take 1 tablet (100 mg total) by  mouth daily. 30 tablet 5  . warfarin (COUMADIN) 5 MG tablet TAKE ONE AND ONE-HALF TABLETS DAILY EXCEPT 1 TABLET ON MONDAY, WEDNESDAY, AND FRIDAYS OR AS DIRECTED BY ANTICOAGULATION CLINIC 120 tablet 4   No facility-administered medications prior to visit.  Allergies  Allergen Reactions  . Lisinopril Rash  . Simvastatin Rash    Review of Systems  Constitutional: Negative for fever and malaise/fatigue.  HENT: Negative for congestion.   Eyes: Negative for blurred vision.  Respiratory: Negative for shortness of breath.   Cardiovascular: Negative for chest pain, palpitations and leg swelling.  Gastrointestinal: Negative for abdominal pain, blood in stool and nausea.  Genitourinary: Negative for dysuria and frequency.  Musculoskeletal: Positive for joint pain. Negative for falls.  Skin: Negative for rash.  Neurological: Positive for sensory change. Negative for dizziness, loss of consciousness and headaches.  Endo/Heme/Allergies: Negative for environmental allergies.  Psychiatric/Behavioral: Negative for depression. The patient is not nervous/anxious.        Objective:    Physical Exam Vitals signs reviewed.  Constitutional:      Appearance: Normal appearance. He is not ill-appearing.  HENT:     Head: Normocephalic and atraumatic.     Right Ear: External ear normal.     Left Ear: External ear normal.     Sims: Sims normal.  Pulmonary:     Effort: Pulmonary effort is normal.  Neurological:     Mental Status: He is alert and oriented to person, place, and time.  Psychiatric:        Mood and Affect: Mood normal.        Behavior: Behavior normal.     BP 129/67 (BP Location: Left Arm, Patient Position: Sitting, Cuff Size: Normal)   Pulse 69   Temp 98.3 F (36.8 C) (Oral)   Wt 220 lb 4.8 oz (99.9 kg)   BMI 27.54 kg/m  Wt Readings from Last 3 Encounters:  01/15/19 220 lb 4.8 oz (99.9 kg)  07/10/18 223 lb 3.2 oz (101.2 kg)  06/19/18 221 lb (100.2 kg)    Diabetic Foot  Exam - Simple   No data filed     Lab Results  Component Value Date   WBC 5.3 05/22/2018   HGB 15.2 05/22/2018   HCT 46.3 05/22/2018   PLT 251 05/22/2018   GLUCOSE 94 07/10/2018   CHOL 131 07/10/2018   TRIG 87.0 07/10/2018   HDL 48.90 07/10/2018   LDLDIRECT 110.4 10/03/2012   LDLCALC 65 07/10/2018   ALT 18 07/10/2018   AST 20 07/10/2018   NA 139 07/10/2018   K 4.3 07/10/2018   CL 102 07/10/2018   CREATININE 1.08 07/10/2018   BUN 15 07/10/2018   CO2 28 07/10/2018   TSH 1.47 07/10/2018   PSA 1.78 07/10/2018   INR 2.9 12/25/2018   HGBA1C  08/28/2010    4.7 (NOTE)                                                                       According to the ADA Clinical Practice Recommendations for 2011, when HbA1c is used as a screening test:   >=6.5%   Diagnostic of Diabetes Mellitus           (if abnormal result  is confirmed)  5.7-6.4%   Increased risk of developing Diabetes Mellitus  References:Diagnosis and Classification of Diabetes Mellitus,Diabetes GUYQ,0347,42(VZDGL 1):S62-S69 and Standards of Medical Care in         Diabetes - 2011,Diabetes Care,2011,34  (Suppl 1):S11-S61.  Lab Results  Component Value Date   TSH 1.47 07/10/2018   Lab Results  Component Value Date   WBC 5.3 05/22/2018   HGB 15.2 05/22/2018   HCT 46.3 05/22/2018   MCV 98 (H) 05/22/2018   PLT 251 05/22/2018   Lab Results  Component Value Date   NA 139 07/10/2018   K 4.3 07/10/2018   CO2 28 07/10/2018   GLUCOSE 94 07/10/2018   BUN 15 07/10/2018   CREATININE 1.08 07/10/2018   BILITOT 1.4 (H) 07/10/2018   ALKPHOS 66 07/10/2018   AST 20 07/10/2018   ALT 18 07/10/2018   PROT 7.5 07/10/2018   ALBUMIN 4.4 07/10/2018   CALCIUM 9.6 07/10/2018   GFR 89.42 07/10/2018   Lab Results  Component Value Date   CHOL 131 07/10/2018   Lab Results  Component Value Date   HDL 48.90 07/10/2018   Lab Results  Component Value Date   LDLCALC 65 07/10/2018   Lab Results  Component Value Date   TRIG  87.0 07/10/2018   Lab Results  Component Value Date   CHOLHDL 3 07/10/2018   Lab Results  Component Value Date   HGBA1C  08/28/2010    4.7 (NOTE)                                                                       According to the ADA Clinical Practice Recommendations for 2011, when HbA1c is used as a screening test:   >=6.5%   Diagnostic of Diabetes Mellitus           (if abnormal result  is confirmed)  5.7-6.4%   Increased risk of developing Diabetes Mellitus  References:Diagnosis and Classification of Diabetes Mellitus,Diabetes IOXB,3532,99(MEQAS 1):S62-S69 and Standards of Medical Care in         Diabetes - 2011,Diabetes Care,2011,34  (Suppl 1):S11-S61.       Assessment & Plan:   Problem List Items Addressed This Visit    Hyperlipidemia    Encouraged heart healthy diet, increase exercise, avoid trans fats, consider a krill oil cap daily      Atrial fibrillation (HCC)    Asymptomatic and rate controlled      Long term current use of anticoagulant therapy    Following with cardiology to have INR monitored      HTN (hypertension)    Well controlled, no changes to meds. Encouraged heart healthy diet such as the DASH diet and exercise as tolerated.       Neuropathy    Not notably improved as he has been off his feet more at work         I am having Tyler Sims maintain his COD LIVER OIL PO, Co Q-10, cholecalciferol, thiamine, atorvastatin, diphenhydramine-acetaminophen, Chlorpheniramine-Acetaminophen (CORICIDIN HBP COLD/FLU PO), losartan, metoprolol succinate, spironolactone, warfarin, gabapentin, and folic acid.  No orders of the defined types were placed in this encounter.    I discussed the assessment and treatment plan with the patient. The patient was provided an opportunity to ask questions and all were answered. The patient agreed with the plan and demonstrated an understanding of the instructions.   The patient was advised to call back or seek an in-person  evaluation if the symptoms worsen or if the  condition fails to improve as anticipated.  I provided 25 minutes of non-face-to-face time during this encounter.   Penni Homans, MD

## 2019-01-24 ENCOUNTER — Encounter: Payer: Self-pay | Admitting: Family Medicine

## 2019-02-05 ENCOUNTER — Ambulatory Visit: Payer: Managed Care, Other (non HMO) | Admitting: General Practice

## 2019-02-05 ENCOUNTER — Other Ambulatory Visit: Payer: Self-pay

## 2019-02-05 DIAGNOSIS — I4891 Unspecified atrial fibrillation: Secondary | ICD-10-CM

## 2019-02-05 DIAGNOSIS — Z7901 Long term (current) use of anticoagulants: Secondary | ICD-10-CM | POA: Diagnosis not present

## 2019-02-05 LAB — POCT INR: INR: 2.6 (ref 2.0–3.0)

## 2019-02-05 NOTE — Patient Instructions (Addendum)
Pre visit review using our clinic review tool, if applicable. No additional management support is needed unless otherwise documented below in the visit note.  Continue to take 1 tablet daily except 1 1/2 tablets on Mon/Wed/Fridays.  Re-check  In 6 weeks

## 2019-02-19 ENCOUNTER — Ambulatory Visit: Payer: Managed Care, Other (non HMO) | Admitting: Physician Assistant

## 2019-03-19 ENCOUNTER — Ambulatory Visit (INDEPENDENT_AMBULATORY_CARE_PROVIDER_SITE_OTHER): Payer: Managed Care, Other (non HMO) | Admitting: General Practice

## 2019-03-19 ENCOUNTER — Other Ambulatory Visit: Payer: Self-pay

## 2019-03-19 DIAGNOSIS — Z7901 Long term (current) use of anticoagulants: Secondary | ICD-10-CM

## 2019-03-19 DIAGNOSIS — I4891 Unspecified atrial fibrillation: Secondary | ICD-10-CM | POA: Diagnosis not present

## 2019-03-19 LAB — POCT INR: INR: 2.4 (ref 2.0–3.0)

## 2019-03-19 NOTE — Patient Instructions (Signed)
Pre visit review using our clinic review tool, if applicable. No additional management support is needed unless otherwise documented below in the visit note.  Continue to take 1 tablet daily except 1 1/2 tablets on Mon/Wed/Fridays.  Re-check  In 6 weeks

## 2019-03-25 NOTE — Progress Notes (Signed)
Medical screening examination/treatment/procedure(s) were performed by non-physician practitioner and as supervising physician I was immediately available for consultation/collaboration. I agree with above. James John, MD   

## 2019-04-16 ENCOUNTER — Ambulatory Visit: Payer: Managed Care, Other (non HMO) | Admitting: Family Medicine

## 2019-04-16 ENCOUNTER — Encounter: Payer: Self-pay | Admitting: Family Medicine

## 2019-04-16 ENCOUNTER — Other Ambulatory Visit: Payer: Self-pay

## 2019-04-16 VITALS — BP 132/74 | HR 63 | Temp 98.2°F | Resp 18 | Wt 219.2 lb

## 2019-04-16 DIAGNOSIS — E559 Vitamin D deficiency, unspecified: Secondary | ICD-10-CM

## 2019-04-16 DIAGNOSIS — I4891 Unspecified atrial fibrillation: Secondary | ICD-10-CM | POA: Diagnosis not present

## 2019-04-16 DIAGNOSIS — E782 Mixed hyperlipidemia: Secondary | ICD-10-CM | POA: Diagnosis not present

## 2019-04-16 DIAGNOSIS — Z23 Encounter for immunization: Secondary | ICD-10-CM

## 2019-04-16 DIAGNOSIS — I10 Essential (primary) hypertension: Secondary | ICD-10-CM

## 2019-04-16 LAB — LIPID PANEL
Cholesterol: 138 mg/dL (ref 0–200)
HDL: 60.1 mg/dL (ref >39.00)
LDL Cholesterol: 68 mg/dL (ref 0–99)
NonHDL: 77.77
Total CHOL/HDL Ratio: 2
Triglycerides: 51 mg/dL (ref 0.0–149.0)
VLDL: 10.2 mg/dL (ref 0.0–40.0)

## 2019-04-16 LAB — COMPREHENSIVE METABOLIC PANEL
ALT: 18 U/L (ref 0–53)
AST: 21 U/L (ref 0–37)
Albumin: 4.3 g/dL (ref 3.5–5.2)
Alkaline Phosphatase: 75 U/L (ref 39–117)
BUN: 14 mg/dL (ref 6–23)
CO2: 30 mEq/L (ref 19–32)
Calcium: 9.7 mg/dL (ref 8.4–10.5)
Chloride: 104 mEq/L (ref 96–112)
Creatinine, Ser: 0.96 mg/dL (ref 0.40–1.50)
GFR: 96.14 mL/min (ref >60.00)
Glucose, Bld: 100 mg/dL — ABNORMAL HIGH (ref 70–99)
Potassium: 4.3 mEq/L (ref 3.5–5.1)
Sodium: 139 mEq/L (ref 135–145)
Total Bilirubin: 1.1 mg/dL (ref 0.2–1.2)
Total Protein: 7.5 g/dL (ref 6.0–8.3)

## 2019-04-16 LAB — CBC
HCT: 40.8 % (ref 39.0–52.0)
Hemoglobin: 14.1 g/dL (ref 13.0–17.0)
MCHC: 34.5 g/dL (ref 30.0–36.0)
MCV: 95.3 fl (ref 78.0–100.0)
Platelets: 188 10*3/uL (ref 150.0–400.0)
RBC: 4.28 Mil/uL (ref 4.22–5.81)
RDW: 13.4 % (ref 11.5–15.5)
WBC: 4.1 10*3/uL (ref 4.0–10.5)

## 2019-04-16 LAB — VITAMIN D 25 HYDROXY (VIT D DEFICIENCY, FRACTURES): VITD: 52.01 ng/mL (ref 30.00–100.00)

## 2019-04-16 LAB — TSH: TSH: 1.17 u[IU]/mL (ref 0.35–4.50)

## 2019-04-16 NOTE — Progress Notes (Signed)
Subjective:    Patient ID: Tyler Sims, male    DOB: 15-Nov-1955, 63 y.o.   MRN: [id number]  No chief complaint on file.   HPI Patient is in today for follow up on chronic medical concerns including hypertension, hyperlipidemia, peripheral neuropathy and more. He is back at work and doing well. No recent febrile illness or hospitalizations. No polyuria or polydipsia. His peripheral neuropathy continues to bother him daily but is tolerable for the most part. Denies CP/palp/SOB/HA/congestion/fevers/GI or GU c/o. Taking meds as prescribed  Past Medical History:  Diagnosis Date  . Abnormal transaminases   . Acid reflux   . Bradycardia    nocturnal  . Colon polyps 02/07/2012   Dr. Silvano Rusk  . Elevated sed rate 07/06/2017  . HTN (hypertension) 06/02/2014  . Low back pain 06/15/2015  . Measles ([place])   . Mitral regurgitation   . Mumps   . Neuropathy 01/10/2016  . NICM (nonischemic cardiomyopathy) ([place])   . Persistent atrial fibrillation 08/24/2010  . Preventative health care 12/04/2013   Colonsocopy May '13 - adenomatous polyps  Immunizations Tdap Jan '14   . Stroke ([place]) 08/24/2010  . Systolic CHF ([place])   . Urinary hesitancy 12/21/2014  . Varicella   . Ventricular tachycardia, non-sustained [facility])     Past Surgical History:  Procedure Laterality Date  . CARDIAC CATHETERIZATION    . CARDIOVERSION  08/15/2012   Procedure: CARDIOVERSION;  Surgeon: Deboraha Sprang, MD;  Location: [facility] ENDOSCOPY;  Service: Cardiovascular;  Laterality: N/A;  . CARDIOVERSION N/A 05/28/2018   Procedure: CARDIOVERSION;  Surgeon: Fay Records, MD;  Location: [place];  Service: Cardiovascular;  Laterality: N/A;  . COLONOSCOPY W/ BIOPSIES  02/07/2012   Dr. Silvano Rusk    Family History  Problem Relation Age of Onset  . Alzheimer's disease Mother   . Lung cancer Brother        x 2, smokers  . Hypertension Brother   . Heart disease Brother   . Cancer Brother   . Lung cancer Brother   .  Hypertension Brother   . Colon polyps Brother   . Hypertension Brother   . Intellectual disability Brother   . Hypertension Sister   . Neuropathy Sister   . Arthritis Sister   . Heart disease Father        CAD/MI  . Hypertension Father   . Mental illness Father        h/o depression requiring hospitalization  . Hyperlipidemia Sister        x 2  . Hypertension Sister   . Arthritis Sister   . Obesity Sister   . Varicose Veins Daughter   . Birth defects Paternal Aunt     Social History   Socioeconomic History  . Marital status: Married    Spouse name: Charlett Nose  . Number of children: 2  . Years of education: 37  . Highest education level: Not on file  Occupational History  . Occupation: Health visitor: [employer]  . Financial resource strain: Not on file  . Food insecurity    Worry: Not on file    Inability: Not on file  . Transportation needs    Medical: Not on file    Non-medical: Not on file  Tobacco Use  . Smoking status: Never Smoker  . Smokeless tobacco: Never Used  Substance and Sexual Activity  . Alcohol use: Yes    Alcohol/week: 0.0 standard drinks  Comment: occasional wine  . Drug use: No  . Sexual activity: Yes    Partners: Female    Comment: lives with wife, no dietary restrictions, except avoid dairy, uses Lactaid, works with [employer]  Lifestyle  . Physical activity    Days per week: Not on file    Minutes per session: Not on file  . Stress: Not on file  Relationships  . Social Herbalist on phone: Not on file    Gets together: Not on file    Attends religious service: Not on file    Active member of club or organization: Not on file    Attends meetings of clubs or organizations: Not on file    Relationship status: Not on file  . Intimate partner violence    Fear of current or ex partner: Not on file    Emotionally abused: Not on file    Physically abused: Not on file    Forced sexual  activity: Not on file  Other Topics Concern  . Not on file  Social History Narrative   Patient is married with 2 children. 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   Patient has 16 years of education.   Patient drinks 1-2 cups daily.    Outpatient Medications Prior to Visit  Medication Sig Dispense Refill  . atorvastatin (LIPITOR) 20 MG tablet Take 20 mg by mouth daily.    . Chlorpheniramine-Acetaminophen (CORICIDIN HBP COLD/FLU PO) Take by mouth.    . cholecalciferol (VITAMIN D) 1000 units tablet Take 1 tablet (1,000 Units total) by mouth daily. (Patient taking differently: Take 1,000 Units by mouth every other day. )    . COD LIVER OIL PO Take 1 capsule by mouth daily.     . Coenzyme Q10 (CO Q-10) 200 MG CAPS Take 200 mg by mouth daily.     . diphenhydramine-acetaminophen (TYLENOL PM) 25-500 MG TABS tablet Take 2 tablets by mouth at bedtime as needed (pain / sleep).    . folic acid (FOLVITE) 1 MG tablet TAKE 1 TABLET BY MOUTH EVERY DAY 90 tablet 1  . gabapentin (NEURONTIN) 800 MG tablet TAKE 1 TABLET BY MOUTH THREE TIMES A DAY 270 tablet 2  . losartan (COZAAR) 25 MG tablet TAKE 1 TABLET BY MOUTH EVERY DAY 90 tablet 3  . metoprolol succinate (TOPROL-XL) 25 MG 24 hr tablet TAKE 1 TABLET DAILY 90 tablet 4  . spironolactone (ALDACTONE) 25 MG tablet TAKE ONE-HALF (1/2) TABLET DAILY 45 tablet 3  . thiamine (VITAMIN B-1) 100 MG tablet Take 1 tablet (100 mg total) by mouth daily. 30 tablet 5  . warfarin (COUMADIN) 5 MG tablet TAKE ONE AND ONE-HALF TABLETS DAILY EXCEPT 1 TABLET ON MONDAY, WEDNESDAY, AND FRIDAYS OR AS DIRECTED BY ANTICOAGULATION CLINIC 120 tablet 4   No facility-administered medications prior to visit.     Allergies  Allergen Reactions  . Lisinopril Rash  . Simvastatin Rash    Review of Systems  Constitutional: Negative for fever and malaise/fatigue.  HENT: Negative for  congestion.   Eyes: Negative for blurred vision.  Respiratory: Negative for shortness of breath.   Cardiovascular: Negative for chest pain, palpitations and leg swelling.  Gastrointestinal: Negative for abdominal pain, blood in stool and nausea.  Genitourinary: Negative for dysuria and frequency.  Musculoskeletal: Positive for joint pain and myalgias. Negative for falls.  Skin: Negative for rash.  Neurological: Negative for dizziness, loss of consciousness and headaches.  Endo/Heme/Allergies: Negative for environmental allergies.  Psychiatric/Behavioral: Negative for depression. The patient is not nervous/anxious.        Objective:    Physical Exam Vitals signs and nursing note reviewed.  Constitutional:      General: He is not in acute distress.    Appearance: He is well-developed.  HENT:     Head: Normocephalic and atraumatic.     Nose: Nose normal.  Eyes:     General:        Right eye: No discharge.        Left eye: No discharge.  Neck:     Musculoskeletal: Normal range of motion and neck supple.  Cardiovascular:     Rate and Rhythm: Normal rate and regular rhythm.     Heart sounds: No murmur.  Pulmonary:     Effort: Pulmonary effort is normal.     Breath sounds: Normal breath sounds.  Abdominal:     General: Bowel sounds are normal.     Palpations: Abdomen is soft.     Tenderness: There is no abdominal tenderness.  Skin:    General: Skin is warm and dry.  Neurological:     Mental Status: He is alert and oriented to person, place, and time.     BP 132/74 (BP Location: Left Arm, Patient Position: Sitting, Cuff Size: Normal)   Pulse 63   Temp 98.2 F (36.8 C) (Oral)   Resp 18   Wt 219 lb 3.2 oz (99.4 kg)   SpO2 97%   BMI 27.40 kg/m  Wt Readings from Last 3 Encounters:  04/16/19 219 lb 3.2 oz (99.4 kg)  01/15/19 220 lb 4.8 oz (99.9 kg)  07/10/18 223 lb 3.2 oz (101.2 kg)    Diabetic Foot Exam - Simple   No data filed     Lab Results  Component Value  Date   WBC 4.1 04/16/2019   HGB 14.1 04/16/2019   HCT 40.8 04/16/2019   PLT 188.0 04/16/2019   GLUCOSE 100 (H) 04/16/2019   CHOL 138 04/16/2019   TRIG 51.0 04/16/2019   HDL 60.10 04/16/2019   LDLDIRECT 110.4 10/03/2012   LDLCALC 68 04/16/2019   ALT 18 04/16/2019   AST 21 04/16/2019   NA 139 04/16/2019   K 4.3 04/16/2019   CL 104 04/16/2019   CREATININE 0.96 04/16/2019   BUN 14 04/16/2019   CO2 30 04/16/2019   TSH 1.17 04/16/2019   PSA 1.78 07/10/2018   INR 2.4 03/19/2019   HGBA1C  08/28/2010    4.7 (NOTE)                                                                       According to the ADA Clinical Practice Recommendations for 2011, when HbA1c is used as a screening test:   >=6.5%   Diagnostic of Diabetes Mellitus           (if abnormal result  is confirmed)  5.7-6.4%   Increased risk of developing Diabetes Mellitus  References:Diagnosis and Classification of Diabetes Mellitus,Diabetes NWGN,5621,30(QMVHQ 1):S62-S69 and Standards of Medical Care in         Diabetes - 2011,Diabetes IONG,2952,84  (  Suppl 1):S11-S61.    Lab Results  Component Value Date   TSH 1.17 04/16/2019   Lab Results  Component Value Date   WBC 4.1 04/16/2019   HGB 14.1 04/16/2019   HCT 40.8 04/16/2019   MCV 95.3 04/16/2019   PLT 188.0 04/16/2019   Lab Results  Component Value Date   NA 139 04/16/2019   K 4.3 04/16/2019   CO2 30 04/16/2019   GLUCOSE 100 (H) 04/16/2019   BUN 14 04/16/2019   CREATININE 0.96 04/16/2019   BILITOT 1.1 04/16/2019   ALKPHOS 75 04/16/2019   AST 21 04/16/2019   ALT 18 04/16/2019   PROT 7.5 04/16/2019   ALBUMIN 4.3 04/16/2019   CALCIUM 9.7 04/16/2019   GFR 96.14 04/16/2019   Lab Results  Component Value Date   CHOL 138 04/16/2019   Lab Results  Component Value Date   HDL 60.10 04/16/2019   Lab Results  Component Value Date   LDLCALC 68 04/16/2019   Lab Results  Component Value Date   TRIG 51.0 04/16/2019   Lab Results  Component Value Date    CHOLHDL 2 04/16/2019   Lab Results  Component Value Date   HGBA1C  08/28/2010    4.7 (NOTE)                                                                       According to the ADA Clinical Practice Recommendations for 2011, when HbA1c is used as a screening test:   >=6.5%   Diagnostic of Diabetes Mellitus           (if abnormal result  is confirmed)  5.7-6.4%   Increased risk of developing Diabetes Mellitus  References:Diagnosis and Classification of Diabetes Mellitus,Diabetes DTOI,7124,58(KDXIP 1):S62-S69 and Standards of Medical Care in         Diabetes - 2011,Diabetes Care,2011,34  (Suppl 1):S11-S61.       Assessment & Plan:   Problem List Items Addressed This Visit    Hyperlipidemia - Primary   Relevant Orders   Lipid panel (Completed)   Atrial fibrillation ([place])    Rate controlled, asymptomatic      HTN (hypertension)    Well controlled, no changes to meds. Encouraged heart healthy diet such as the DASH diet and exercise as tolerated.       Relevant Orders   CBC (Completed)   Comprehensive metabolic panel (Completed)   TSH (Completed)   Vitamin D deficiency    Supplement and monitor      Relevant Orders   VITAMIN D 25 Hydroxy (Vit-D Deficiency, Fractures) (Completed)    Other Visit Diagnoses    Need for shingles vaccine       Relevant Orders   Varicella-zoster vaccine IM (Shingrix) (Completed)      I am having Yanixan Kronick maintain his COD LIVER OIL PO, Co Q-10, cholecalciferol, thiamine, atorvastatin, diphenhydramine-acetaminophen, Chlorpheniramine-Acetaminophen (CORICIDIN HBP COLD/FLU PO), losartan, metoprolol succinate, spironolactone, warfarin, gabapentin, and folic acid.  No orders of the defined types were placed in this encounter.    Penni Homans, MD

## 2019-04-16 NOTE — Assessment & Plan Note (Signed)
Supplement and monitor 

## 2019-04-16 NOTE — Assessment & Plan Note (Signed)
Rate controlled, asymptomatic 

## 2019-04-16 NOTE — Assessment & Plan Note (Signed)
Well controlled, no changes to meds. Encouraged heart healthy diet such as the DASH diet and exercise as tolerated.  °

## 2019-04-30 ENCOUNTER — Other Ambulatory Visit: Payer: Self-pay

## 2019-04-30 ENCOUNTER — Ambulatory Visit (INDEPENDENT_AMBULATORY_CARE_PROVIDER_SITE_OTHER): Payer: Managed Care, Other (non HMO) | Admitting: General Practice

## 2019-04-30 DIAGNOSIS — Z7901 Long term (current) use of anticoagulants: Secondary | ICD-10-CM

## 2019-04-30 DIAGNOSIS — I4891 Unspecified atrial fibrillation: Secondary | ICD-10-CM

## 2019-04-30 LAB — POCT INR: INR: 2.1 (ref 2.0–3.0)

## 2019-04-30 NOTE — Patient Instructions (Addendum)
Pre visit review using our clinic review tool, if applicable. No additional management support is needed unless otherwise documented below in the visit note.  Continue to take 1 tablet daily except 1 1/2 tablets on Mon/Wed/Fridays.  Re-check  In 6 weeks

## 2019-05-20 ENCOUNTER — Encounter: Payer: Self-pay | Admitting: Family Medicine

## 2019-06-08 ENCOUNTER — Other Ambulatory Visit: Payer: Self-pay | Admitting: Family Medicine

## 2019-06-11 ENCOUNTER — Other Ambulatory Visit: Payer: Self-pay

## 2019-06-11 ENCOUNTER — Ambulatory Visit (INDEPENDENT_AMBULATORY_CARE_PROVIDER_SITE_OTHER): Payer: Managed Care, Other (non HMO) | Admitting: General Practice

## 2019-06-11 DIAGNOSIS — Z23 Encounter for immunization: Secondary | ICD-10-CM | POA: Diagnosis not present

## 2019-06-11 DIAGNOSIS — Z7901 Long term (current) use of anticoagulants: Secondary | ICD-10-CM

## 2019-06-11 DIAGNOSIS — I4891 Unspecified atrial fibrillation: Secondary | ICD-10-CM

## 2019-06-11 LAB — POCT INR: INR: 2.2 (ref 2.0–3.0)

## 2019-06-11 NOTE — Patient Instructions (Signed)
lbpcmh

## 2019-06-13 ENCOUNTER — Other Ambulatory Visit: Payer: Self-pay | Admitting: Internal Medicine

## 2019-06-13 MED ORDER — LOSARTAN POTASSIUM 25 MG PO TABS: 25.0000 mg | ORAL_TABLET | Freq: Every day | ORAL | 0 refills | Status: DC

## 2019-06-13 NOTE — Telephone Encounter (Signed)
Pt's medication was sent to pt's pharmacy as requested. Confirmation received.  °

## 2019-06-20 ENCOUNTER — Other Ambulatory Visit: Payer: Self-pay | Admitting: Internal Medicine

## 2019-07-10 ENCOUNTER — Other Ambulatory Visit: Payer: Self-pay | Admitting: Internal Medicine

## 2019-07-11 ENCOUNTER — Other Ambulatory Visit: Payer: Self-pay | Admitting: Family Medicine

## 2019-07-11 DIAGNOSIS — G629 Polyneuropathy, unspecified: Secondary | ICD-10-CM

## 2019-07-11 MED ORDER — GABAPENTIN 800 MG PO TABS: 800.0000 mg | ORAL_TABLET | Freq: Three times a day (TID) | ORAL | 2 refills | Status: DC

## 2019-07-11 NOTE — Telephone Encounter (Signed)
Patient would like a refill on his gabapentin (NEURONTIN) 800 MG tablet medication and have it sent to his preferred pharmacy [facility] [address] [facility].

## 2019-07-11 NOTE — Telephone Encounter (Signed)
Requested medication (s) are due for refill today: yes  Requested medication (s) are on the active medication list: yes  Last refill:  09/14/2018  Future visit scheduled: yes  Notes to clinic:  Last filled by different provider    Requested Prescriptions  Pending Prescriptions Disp Refills   gabapentin (NEURONTIN) 800 MG tablet 270 tablet 2    Sig: Take 1 tablet (800 mg total) by mouth 3 (three) times daily.     Neurology: Anticonvulsants - gabapentin Passed - 07/11/2019  1:12 PM      Passed - Valid encounter within last 12 months    Recent Outpatient Visits          2 months ago Mixed hyperlipidemia   [facility] [place], [place]   5 months ago Essential hypertension   [facility] [place], MD   1 year ago Nocturia   [facility] [place], MD   1 year ago Neuropathy   [facility] [place], MD   2 years ago Preventative health care   [facility], [place]      Future Appointments            In 1 month Deboraha Sprang, MD [place], [facility]   In 3 months Charlett Blake, Bonnita Levan, MD [place] at [facility], [place]

## 2019-07-23 ENCOUNTER — Ambulatory Visit: Payer: Managed Care, Other (non HMO)

## 2019-07-30 ENCOUNTER — Other Ambulatory Visit: Payer: Self-pay

## 2019-07-30 ENCOUNTER — Ambulatory Visit (INDEPENDENT_AMBULATORY_CARE_PROVIDER_SITE_OTHER): Payer: Managed Care, Other (non HMO) | Admitting: General Practice

## 2019-07-30 ENCOUNTER — Other Ambulatory Visit: Payer: Self-pay | Admitting: Family Medicine

## 2019-07-30 DIAGNOSIS — Z7901 Long term (current) use of anticoagulants: Secondary | ICD-10-CM | POA: Diagnosis not present

## 2019-07-30 DIAGNOSIS — I4891 Unspecified atrial fibrillation: Secondary | ICD-10-CM

## 2019-07-30 LAB — POCT INR: INR: 2.8 (ref 2.0–3.0)

## 2019-07-30 NOTE — Patient Instructions (Addendum)
Pre visit review using our clinic review tool, if applicable. No additional management support is needed unless otherwise documented below in the visit note.  Continue to take 1 tablet daily except 1 1/2 tablets on Mon/Wed/Fridays.  Re-check  In 6 weeks 

## 2019-07-30 NOTE — Progress Notes (Signed)
Medical screening examination/treatment/procedure(s) were performed by non-physician practitioner and as supervising physician I was immediately available for consultation/collaboration. I agree with above. Chioke Noxon, MD   

## 2019-08-12 ENCOUNTER — Ambulatory Visit: Payer: Managed Care, Other (non HMO) | Admitting: Internal Medicine

## 2019-08-12 ENCOUNTER — Other Ambulatory Visit: Payer: Self-pay

## 2019-08-12 ENCOUNTER — Encounter: Payer: Self-pay | Admitting: Internal Medicine

## 2019-08-12 VITALS — BP 148/92 | HR 68 | Ht 75.0 in | Wt 222.0 lb

## 2019-08-12 DIAGNOSIS — I480 Paroxysmal atrial fibrillation: Secondary | ICD-10-CM | POA: Diagnosis not present

## 2019-08-12 DIAGNOSIS — Z79899 Other long term (current) drug therapy: Secondary | ICD-10-CM

## 2019-08-12 MED ORDER — SPIRONOLACTONE 25 MG PO TABS: 25.0000 mg | ORAL_TABLET | Freq: Every day | ORAL | 3 refills | Status: DC

## 2019-08-12 NOTE — Patient Instructions (Addendum)
Medication Instructions:  Your physician has recommended you make the following change in your medication: Increase you Spironolactone to 25mg  a day.   *If you need a refill on your cardiac medications before your next appointment, please call your pharmacy*  Lab Work: Your physician recommends that you return for lab work in: 2 weeks .... BMET  If you have labs (blood work) drawn today and your tests are completely normal, you will receive your results only by: Marland Kitchen MyChart Message (if you have MyChart) OR . A paper copy in the mail If you have any lab test that is abnormal or we need to change your treatment, we will call you to review the results.  Testing/Procedures: None   Follow-Up: At [facility], you and your health needs are our priority.  As part of our continuing mission to provide you with exceptional heart care, we have created designated Provider Care Teams.  These Care Teams include your primary Cardiologist (physician) and Advanced Practice Providers (APPs -  Physician Assistants and Nurse Practitioners) who all work together to provide you with the care you need, when you need it.  Your next appointment:   1 year(s)  The format for your next appointment:   In Person  Provider:   Virl Axe, MD  Other Instructions

## 2019-08-12 NOTE — Progress Notes (Signed)
Patient Care Team: Mosie Lukes, MD as PCP - General (Family Medicine) Gatha Mayer, MD as Consulting Physician (Gastroenterology) Garvin Fila, MD as Consulting Physician (Neurology)   HPI  Tyler Sims is a 63 y.o. male Seen in followup for nonischemic cardiomyopathy with which he presented with a stroke and atrial fibrillation.  He is on Coumadin anticoagulation; no bleeding  It was initially hoped that this was secondary to the rate. However, despite restoration of sinus rhythm, he has had persistent left ventricular dysfunction   No interval atrial fib  The patient denies chest pain, shortness of breath, nocturnal dyspnea, orthopnea or peripheral edema.  There have been no palpitations, lightheadedness or syncope.    BP elevated at home     Last INR was about 3  DATE TEST EF   1/1 LHC  CAs normal  1/12 Echo 20-25 %   1/14 Echo 40-45 %   8/17 Echo 45-50 %        Past Medical History:  Diagnosis Date  . Abnormal transaminases   . Acid reflux   . Bradycardia    nocturnal  . Colon polyps 02/07/2012   Dr. Silvano Rusk  . Elevated sed rate 07/06/2017  . HTN (hypertension) 06/02/2014  . Low back pain 06/15/2015  . Measles ([place])   . Mitral regurgitation   . Mumps   . Neuropathy 01/10/2016  . NICM (nonischemic cardiomyopathy) ([place])   . Persistent atrial fibrillation ([place]) 08/24/2010  . Preventative health care 12/04/2013   Colonsocopy May '13 - adenomatous polyps  Immunizations Tdap Jan '14   . Stroke ([place]) 08/24/2010  . Systolic CHF ([place])   . Urinary hesitancy 12/21/2014  . Varicella   . Ventricular tachycardia, non-sustained [facility])     Past Surgical History:  Procedure Laterality Date  . CARDIAC CATHETERIZATION    . CARDIOVERSION  08/15/2012   Procedure: CARDIOVERSION;  Surgeon: Deboraha Sprang, MD;  Location: [facility] ENDOSCOPY;  Service: Cardiovascular;  Laterality: N/A;  . CARDIOVERSION N/A 05/28/2018   Procedure: CARDIOVERSION;  Surgeon: Fay Records, MD;   Location: [facility] ENDOSCOPY;  Service: Cardiovascular;  Laterality: N/A;  . COLONOSCOPY W/ BIOPSIES  02/07/2012   Dr. Silvano Rusk    Current Outpatient Medications  Medication Sig Dispense Refill  . atorvastatin (LIPITOR) 20 MG tablet TAKE 1 TABLET DAILY AT 6 P.M. 90 tablet 3  . Chlorpheniramine-Acetaminophen (CORICIDIN HBP COLD/FLU PO) Take by mouth.    . cholecalciferol (VITAMIN D) 1000 units tablet Take 1 tablet (1,000 Units total) by mouth daily. (Patient taking differently: Take 1,000 Units by mouth every other day. )    . COD LIVER OIL PO Take 1 capsule by mouth daily.     . Coenzyme Q10 (CO Q-10) 200 MG CAPS Take 200 mg by mouth daily.     . diphenhydramine-acetaminophen (TYLENOL PM) 25-500 MG TABS tablet Take 2 tablets by mouth at bedtime as needed (pain / sleep).    . folic acid (FOLVITE) 1 MG tablet TAKE 1 TABLET BY MOUTH EVERY DAY 90 tablet 1  . gabapentin (NEURONTIN) 800 MG tablet Take 1 tablet (800 mg total) by mouth 3 (three) times daily. 270 tablet 2  . losartan (COZAAR) 25 MG tablet Take 1 tablet (25 mg total) by mouth daily. 90 tablet 0  . metoprolol succinate (TOPROL-XL) 25 MG 24 hr tablet TAKE 1 TABLET DAILY 90 tablet 4  . spironolactone (ALDACTONE) 25 MG tablet Take 0.5 tablets (12.5 mg total) by mouth daily. Pt needs to  keep upcoming appt in Nov for further refills 45 tablet 0  . thiamine (VITAMIN B-1) 100 MG tablet Take 1 tablet (100 mg total) by mouth daily. 30 tablet 5  . warfarin (COUMADIN) 5 MG tablet TAKE ONE AND ONE-HALF TABLETS DAILY EXCEPT 1 TABLET ON MONDAY, WEDNESDAY, AND FRIDAYS OR AS DIRECTED BY ANTICOAGULATION CLINIC 120 tablet 4   No current facility-administered medications for this visit.     Allergies  Allergen Reactions  . Lisinopril Rash  . Simvastatin Rash    Review of Systems negative except from HPI and PMH  Physical Exam BP (!) 148/92   Pulse 68   Ht 6\' 3"  (1.905 m)   Wt 222 lb (100.7 kg)   SpO2 96%   BMI 27.75 kg/m  Well developed and  nourished in no acute distress HENT normal Neck supple with JVP-  flat   Clear Regular rate and rhythm, no murmurs or gallops Abd-soft with active BS No Clubbing cyanosis edema Skin-warm and dry A & Oriented  Grossly normal sensory and motor function  ECG sinus 68 18/08/40  Assessment and  Plan Persistent  atrial fibrillation  Sinus bradycardia  Hypertension  Nonischemic cardiomyopathy    BP elevated  Will increase aldactone 12.5>>25  And will check BMET in 2 weeks  No atrial fib of which he is aware  On Anticoagulation;  No bleeding issues

## 2019-08-28 ENCOUNTER — Other Ambulatory Visit: Payer: Managed Care, Other (non HMO) | Admitting: *Deleted

## 2019-08-28 ENCOUNTER — Other Ambulatory Visit: Payer: Self-pay

## 2019-08-28 DIAGNOSIS — I480 Paroxysmal atrial fibrillation: Secondary | ICD-10-CM

## 2019-08-28 DIAGNOSIS — Z79899 Other long term (current) drug therapy: Secondary | ICD-10-CM

## 2019-08-28 LAB — BASIC METABOLIC PANEL
BUN/Creatinine Ratio: 15 (ref 10–24)
BUN: 19 mg/dL (ref 8–27)
CO2: 25 mmol/L (ref 20–29)
Calcium: 9.8 mg/dL (ref 8.6–10.2)
Chloride: 102 mmol/L (ref 96–106)
Creatinine, Ser: 1.27 mg/dL (ref 0.76–1.27)
GFR calc Af Amer: 70 mL/min/{1.73_m2} (ref >59)
GFR calc non Af Amer: 60 mL/min/{1.73_m2} (ref >59)
Glucose: 96 mg/dL (ref 65–99)
Potassium: 4.8 mmol/L (ref 3.5–5.2)
Sodium: 138 mmol/L (ref 134–144)

## 2019-09-17 ENCOUNTER — Ambulatory Visit: Payer: Managed Care, Other (non HMO)

## 2019-09-18 ENCOUNTER — Other Ambulatory Visit: Payer: Self-pay | Admitting: Family Medicine

## 2019-09-24 ENCOUNTER — Ambulatory Visit (INDEPENDENT_AMBULATORY_CARE_PROVIDER_SITE_OTHER): Payer: Managed Care, Other (non HMO) | Admitting: General Practice

## 2019-09-24 ENCOUNTER — Other Ambulatory Visit: Payer: Self-pay

## 2019-09-24 DIAGNOSIS — Z7901 Long term (current) use of anticoagulants: Secondary | ICD-10-CM | POA: Diagnosis not present

## 2019-09-24 LAB — POCT INR: INR: 2.2 (ref 2.0–3.0)

## 2019-09-24 NOTE — Progress Notes (Signed)
Medical screening examination/treatment/procedure(s) were performed by non-physician practitioner and as supervising physician I was immediately available for consultation/collaboration. I agree with above. Verna Hamon, MD   

## 2019-09-24 NOTE — Patient Instructions (Signed)
.  lbpcmh

## 2019-09-29 ENCOUNTER — Other Ambulatory Visit: Payer: Self-pay | Admitting: Family Medicine

## 2019-09-30 ENCOUNTER — Other Ambulatory Visit: Payer: Self-pay | Admitting: Internal Medicine

## 2019-09-30 MED ORDER — LOSARTAN POTASSIUM 25 MG PO TABS: 25.0000 mg | ORAL_TABLET | Freq: Every day | ORAL | 3 refills | Status: DC

## 2019-10-15 ENCOUNTER — Other Ambulatory Visit: Payer: Self-pay

## 2019-10-17 ENCOUNTER — Ambulatory Visit: Payer: Managed Care, Other (non HMO) | Admitting: Family Medicine

## 2019-10-17 ENCOUNTER — Other Ambulatory Visit: Payer: Self-pay

## 2019-10-17 ENCOUNTER — Encounter: Payer: Self-pay | Admitting: Family Medicine

## 2019-10-17 DIAGNOSIS — E559 Vitamin D deficiency, unspecified: Secondary | ICD-10-CM

## 2019-10-17 DIAGNOSIS — G629 Polyneuropathy, unspecified: Secondary | ICD-10-CM

## 2019-10-17 DIAGNOSIS — E782 Mixed hyperlipidemia: Secondary | ICD-10-CM | POA: Diagnosis not present

## 2019-10-17 DIAGNOSIS — I10 Essential (primary) hypertension: Secondary | ICD-10-CM

## 2019-10-17 DIAGNOSIS — I4891 Unspecified atrial fibrillation: Secondary | ICD-10-CM

## 2019-10-17 DIAGNOSIS — R700 Elevated erythrocyte sedimentation rate: Secondary | ICD-10-CM

## 2019-10-17 LAB — COMPREHENSIVE METABOLIC PANEL
ALT: 16 U/L (ref 0–53)
AST: 22 U/L (ref 0–37)
Albumin: 4.5 g/dL (ref 3.5–5.2)
Alkaline Phosphatase: 77 U/L (ref 39–117)
BUN: 21 mg/dL (ref 6–23)
CO2: 28 mEq/L (ref 19–32)
Calcium: 10.1 mg/dL (ref 8.4–10.5)
Chloride: 102 mEq/L (ref 96–112)
Creatinine, Ser: 1.11 mg/dL (ref 0.40–1.50)
GFR: 81.18 mL/min (ref >60.00)
Glucose, Bld: 82 mg/dL (ref 70–99)
Potassium: 5 mEq/L (ref 3.5–5.1)
Sodium: 137 mEq/L (ref 135–145)
Total Bilirubin: 1.3 mg/dL — ABNORMAL HIGH (ref 0.2–1.2)
Total Protein: 7.8 g/dL (ref 6.0–8.3)

## 2019-10-17 LAB — CBC
HCT: 40 % (ref 39.0–52.0)
Hemoglobin: 13.8 g/dL (ref 13.0–17.0)
MCHC: 34.4 g/dL (ref 30.0–36.0)
MCV: 93.5 fl (ref 78.0–100.0)
Platelets: 194 10*3/uL (ref 150.0–400.0)
RBC: 4.28 Mil/uL (ref 4.22–5.81)
RDW: 13.2 % (ref 11.5–15.5)
WBC: 4.3 10*3/uL (ref 4.0–10.5)

## 2019-10-17 LAB — LIPID PANEL
Cholesterol: 156 mg/dL (ref 0–200)
HDL: 60.7 mg/dL (ref >39.00)
LDL Cholesterol: 81 mg/dL (ref 0–99)
NonHDL: 94.85
Total CHOL/HDL Ratio: 3
Triglycerides: 71 mg/dL (ref 0.0–149.0)
VLDL: 14.2 mg/dL (ref 0.0–40.0)

## 2019-10-17 LAB — VITAMIN D 25 HYDROXY (VIT D DEFICIENCY, FRACTURES): VITD: 41.51 ng/mL (ref 30.00–100.00)

## 2019-10-17 LAB — TSH: TSH: 1.89 u[IU]/mL (ref 0.35–4.50)

## 2019-10-17 LAB — SEDIMENTATION RATE: Sed Rate: 5 mm/hr (ref 0–20)

## 2019-10-17 NOTE — Assessment & Plan Note (Signed)
Doing some better now that he is working less on his feet due to semi retirement

## 2019-10-17 NOTE — Assessment & Plan Note (Signed)
Well controlled, no changes to meds. Encouraged heart healthy diet such as the DASH diet and exercise as tolerated.  °

## 2019-10-17 NOTE — Assessment & Plan Note (Signed)
Monitor and supplement 

## 2019-10-17 NOTE — Assessment & Plan Note (Signed)
Rate controlled, tolerating coumadin. Follows with cardiology

## 2019-10-17 NOTE — Progress Notes (Signed)
Subjective:    Patient ID: Tyler Sims, male    DOB: Aug 13, 1956, 64 y.o.   MRN: [id number]  Chief Complaint  Patient presents with  . Follow-up    HPI Patient is in today for follow up on chronic medical concerns. No recent febrile illness or hospitalizations. He has partially retired this month and is much happier. He is working just 3 days a week. He is trying to eat right and stay active. Denies CP/palp/SOB/HA/congestion/fevers/GI or GU c/o. Taking meds as prescribed  Past Medical History:  Diagnosis Date  . Abnormal transaminases   . Acid reflux   . Bradycardia    nocturnal  . Colon polyps 02/07/2012   Dr. Silvano Rusk  . Elevated sed rate 07/06/2017  . HTN (hypertension) 06/02/2014  . Low back pain 06/15/2015  . Measles ([place])   . Mitral regurgitation   . Mumps   . Neuropathy 01/10/2016  . NICM (nonischemic cardiomyopathy) ([place])   . Persistent atrial fibrillation ([place]) 08/24/2010  . Preventative health care 12/04/2013   Colonsocopy May '13 - adenomatous polyps  Immunizations Tdap Jan '14   . Stroke ([place]) 08/24/2010  . Systolic CHF ([place])   . Urinary hesitancy 12/21/2014  . Varicella   . Ventricular tachycardia, non-sustained [facility])     Past Surgical History:  Procedure Laterality Date  . CARDIAC CATHETERIZATION    . CARDIOVERSION  08/15/2012   Procedure: CARDIOVERSION;  Surgeon: Deboraha Sprang, MD;  Location: [facility] ENDOSCOPY;  Service: Cardiovascular;  Laterality: N/A;  . CARDIOVERSION N/A 05/28/2018   Procedure: CARDIOVERSION;  Surgeon: Fay Records, MD;  Location: [place];  Service: Cardiovascular;  Laterality: N/A;  . COLONOSCOPY W/ BIOPSIES  02/07/2012   Dr. Silvano Rusk    Family History  Problem Relation Age of Onset  . Alzheimer's disease Mother   . Lung cancer Brother        x 2, smokers  . Hypertension Brother   . Heart disease Brother   . Cancer Brother   . Lung cancer Brother   . Hypertension Brother   . Colon polyps Brother   . Hypertension  Brother   . Intellectual disability Brother   . Hypertension Sister   . Neuropathy Sister   . Arthritis Sister   . Heart disease Father        CAD/MI  . Hypertension Father   . Mental illness Father        h/o depression requiring hospitalization  . Hyperlipidemia Sister        x 2  . Hypertension Sister   . Arthritis Sister   . Obesity Sister   . Varicose Veins Daughter   . Birth defects Paternal Aunt     Social History   Socioeconomic History  . Marital status: Married    Spouse name: Charlett Nose  . Number of children: 2  . Years of education: 66  . Highest education level: Not on file  Occupational History  . Occupation: Health visitor: [employer]  Tobacco Use  . Smoking status: Never Smoker  . Smokeless tobacco: Never Used  Substance and Sexual Activity  . Alcohol use: Yes    Alcohol/week: 0.0 standard drinks    Comment: occasional wine  . Drug use: No  . Sexual activity: Yes    Partners: Female    Comment: lives with wife, no dietary restrictions, except avoid dairy, uses Lactaid, works with [employer]  Other Topics Concern  . Not  on file  Social History Narrative   Patient is married with 2 children. Store Scientist, clinical (histocompatibility and immunogenetics) [employer]   1 daughter attorney in [place]   Other daughter [employer] grad - pursuing PhD in [place]   Patient is right handed.   Patient has 16 years of education.   Patient drinks 1-2 cups daily.   Social Determinants of Health   Financial Resource Strain:   . Difficulty of Paying Living Expenses: Not on file  Food Insecurity:   . Worried About Charity fundraiser in the Last Year: Not on file  . Ran Out of Food in the Last Year: Not on file  Transportation Needs:   . Lack of Transportation (Medical): Not on file  . Lack of Transportation (Non-Medical): Not on file  Physical Activity:   . Days of Exercise per Week: Not on file  . Minutes of Exercise per Session: Not on file  Stress:   . Feeling of Stress : Not on  file  Social Connections:   . Frequency of Communication with Friends and Family: Not on file  . Frequency of Social Gatherings with Friends and Family: Not on file  . Attends Religious Services: Not on file  . Active Member of Clubs or Organizations: Not on file  . Attends Archivist Meetings: Not on file  . Marital Status: Not on file  Intimate Partner Violence:   . Fear of Current or Ex-Partner: Not on file  . Emotionally Abused: Not on file  . Physically Abused: Not on file  . Sexually Abused: Not on file    Outpatient Medications Prior to Visit  Medication Sig Dispense Refill  . atorvastatin (LIPITOR) 20 MG tablet TAKE 1 TABLET DAILY AT 6 P.M. 90 tablet 3  . Chlorpheniramine-Acetaminophen (CORICIDIN HBP COLD/FLU PO) Take by mouth.    . cholecalciferol (VITAMIN D) 1000 units tablet Take 1 tablet (1,000 Units total) by mouth daily. (Patient taking differently: Take 1,000 Units by mouth every other day. )    . COD LIVER OIL PO Take 1 capsule by mouth daily.     . Coenzyme Q10 (CO Q-10) 200 MG CAPS Take 200 mg by mouth daily.     . diphenhydramine-acetaminophen (TYLENOL PM) 25-500 MG TABS tablet Take 2 tablets by mouth at bedtime as needed (pain / sleep).    . folic acid (FOLVITE) 1 MG tablet TAKE 1 TABLET BY MOUTH EVERY DAY 90 tablet 1  . gabapentin (NEURONTIN) 800 MG tablet Take 1 tablet (800 mg total) by mouth 3 (three) times daily. 270 tablet 2  . losartan (COZAAR) 25 MG tablet Take 1 tablet (25 mg total) by mouth daily. 90 tablet 3  . metoprolol succinate (TOPROL-XL) 25 MG 24 hr tablet TAKE 1 TABLET DAILY 90 tablet 3  . spironolactone (ALDACTONE) 25 MG tablet Take 1 tablet (25 mg total) by mouth daily. 90 tablet 3  . thiamine (VITAMIN B-1) 100 MG tablet Take 1 tablet (100 mg total) by mouth daily. 30 tablet 5  . warfarin (COUMADIN) 5 MG tablet TAKE ONE AND ONE-HALF TABLETS DAILY EXCEPT 1 TABLET ON MONDAY, WEDNESDAY, AND FRIDAYS OR AS DIRECTED BY ANTICOAGULATION CLINIC 120  tablet 4   No facility-administered medications prior to visit.    Allergies  Allergen Reactions  . Lisinopril Rash  . Simvastatin Rash    Review of Systems  Constitutional: Negative for fever and malaise/fatigue.  HENT: Negative for congestion.   Eyes: Negative for blurred vision.  Respiratory: Negative for shortness  of breath.   Cardiovascular: Negative for chest pain, palpitations and leg swelling.  Gastrointestinal: Negative for abdominal pain, blood in stool and nausea.  Genitourinary: Negative for dysuria and frequency.  Musculoskeletal: Negative for falls.  Skin: Negative for rash.  Neurological: Negative for dizziness, loss of consciousness and headaches.  Endo/Heme/Allergies: Negative for environmental allergies.  Psychiatric/Behavioral: Negative for depression. The patient is not nervous/anxious.        Objective:    Physical Exam Vitals and nursing note reviewed.  Constitutional:      General: He is not in acute distress.    Appearance: He is well-developed.  HENT:     Head: Normocephalic and atraumatic.     Nose: Nose normal.  Eyes:     General:        Right eye: No discharge.        Left eye: No discharge.  Cardiovascular:     Rate and Rhythm: Normal rate. Rhythm irregular.     Heart sounds: No murmur.  Pulmonary:     Effort: Pulmonary effort is normal.     Breath sounds: Normal breath sounds.  Abdominal:     General: Bowel sounds are normal.     Palpations: Abdomen is soft.     Tenderness: There is no abdominal tenderness.  Musculoskeletal:     Cervical back: Normal range of motion and neck supple.  Skin:    General: Skin is warm and dry.  Neurological:     Mental Status: He is alert and oriented to person, place, and time.     BP 122/72 (BP Location: Left Arm, Patient Position: Sitting, Cuff Size: Normal)   Pulse 63   Temp (!) 97.5 F (36.4 C) (Temporal)   Resp 18   Ht 6\' 3"  (1.905 m)   Wt 218 lb (98.9 kg)   SpO2 96%   BMI 27.25 kg/m   Wt Readings from Last 3 Encounters:  10/17/19 218 lb (98.9 kg)  08/12/19 222 lb (100.7 kg)  04/16/19 219 lb 3.2 oz (99.4 kg)    Diabetic Foot Exam - Simple   No data filed     Lab Results  Component Value Date   WBC 4.1 04/16/2019   HGB 14.1 04/16/2019   HCT 40.8 04/16/2019   PLT 188.0 04/16/2019   GLUCOSE 96 08/28/2019   CHOL 138 04/16/2019   TRIG 51.0 04/16/2019   HDL 60.10 04/16/2019   LDLDIRECT 110.4 10/03/2012   LDLCALC 68 04/16/2019   ALT 18 04/16/2019   AST 21 04/16/2019   NA 138 08/28/2019   K 4.8 08/28/2019   CL 102 08/28/2019   CREATININE 1.27 08/28/2019   BUN 19 08/28/2019   CO2 25 08/28/2019   TSH 1.17 04/16/2019   PSA 1.78 07/10/2018   INR 2.2 09/24/2019   HGBA1C  08/28/2010    4.7 (NOTE)                                                                       According to the ADA Clinical Practice Recommendations for 2011, when HbA1c is used as a screening test:   >=6.5%   Diagnostic of Diabetes Mellitus           (if abnormal result  is confirmed)  5.7-6.4%   Increased risk of developing Diabetes Mellitus  References:Diagnosis and Classification of Diabetes Mellitus,Diabetes D8842878 1):S62-S69 and Standards of Medical Care in         Diabetes - 2011,Diabetes [id number]  (Suppl 1):S11-S61.    Lab Results  Component Value Date   TSH 1.17 04/16/2019   Lab Results  Component Value Date   WBC 4.1 04/16/2019   HGB 14.1 04/16/2019   HCT 40.8 04/16/2019   MCV 95.3 04/16/2019   PLT 188.0 04/16/2019   Lab Results  Component Value Date   NA 138 08/28/2019   K 4.8 08/28/2019   CO2 25 08/28/2019   GLUCOSE 96 08/28/2019   BUN 19 08/28/2019   CREATININE 1.27 08/28/2019   BILITOT 1.1 04/16/2019   ALKPHOS 75 04/16/2019   AST 21 04/16/2019   ALT 18 04/16/2019   PROT 7.5 04/16/2019   ALBUMIN 4.3 04/16/2019   CALCIUM 9.8 08/28/2019   GFR 96.14 04/16/2019   Lab Results  Component Value Date   CHOL 138 04/16/2019   Lab Results  Component  Value Date   HDL 60.10 04/16/2019   Lab Results  Component Value Date   LDLCALC 68 04/16/2019   Lab Results  Component Value Date   TRIG 51.0 04/16/2019   Lab Results  Component Value Date   CHOLHDL 2 04/16/2019   Lab Results  Component Value Date   HGBA1C  08/28/2010    4.7 (NOTE)                                                                       According to the ADA Clinical Practice Recommendations for 2011, when HbA1c is used as a screening test:   >=6.5%   Diagnostic of Diabetes Mellitus           (if abnormal result  is confirmed)  5.7-6.4%   Increased risk of developing Diabetes Mellitus  References:Diagnosis and Classification of Diabetes Mellitus,Diabetes D8842878 1):S62-S69 and Standards of Medical Care in         Diabetes - 2011,Diabetes Care,2011,34  (Suppl 1):S11-S61.       Assessment & Plan:   Problem List Items Addressed This Visit    Hyperlipidemia    Tolerating statin, encouraged heart healthy diet, avoid trans fats, minimize simple carbs and saturated fats. Increase exercise as tolerated      Relevant Orders   Lipid panel   Atrial fibrillation (HCC)    Rate controlled, tolerating coumadin. Follows with cardiology      HTN (hypertension)    Well controlled, no changes to meds. Encouraged heart healthy diet such as the DASH diet and exercise as tolerated.       Relevant Orders   CBC   Comprehensive metabolic panel   TSH   Vitamin D deficiency    Monitor and supplement      Relevant Orders   VITAMIN D 25 Hydroxy (Vit-D Deficiency, Fractures)   Neuropathy    Doing some better now that he is working less on his feet due to semi retirement      Elevated sed rate    monitor      Relevant Orders   Sedimentation rate      I am having [place] maintain  his COD LIVER OIL PO, Co Q-10, cholecalciferol, thiamine, diphenhydramine-acetaminophen, Chlorpheniramine-Acetaminophen (CORICIDIN HBP COLD/FLU PO), warfarin, gabapentin,  atorvastatin, spironolactone, metoprolol succinate, folic acid, and losartan.  No orders of the defined types were placed in this encounter.    Penni Homans, MD

## 2019-10-17 NOTE — Assessment & Plan Note (Addendum)
monitor

## 2019-10-17 NOTE — Assessment & Plan Note (Signed)
Tolerating statin, encouraged heart healthy diet, avoid trans fats, minimize simple carbs and saturated fats. Increase exercise as tolerated 

## 2019-10-17 NOTE — Patient Instructions (Addendum)
[email or web address]   Multivitamin with minerals, selenium Vitamin D 2000 IU Probiotic daily, lactobicillus and bifidophilus  Melatonin 2-10 mg at bedtime  Hypertension, Adult High blood pressure (hypertension) is when the force of blood pumping through the arteries is too strong. The arteries are the blood vessels that carry blood from the heart throughout the body. Hypertension forces the heart to work harder to pump blood and may cause arteries to become narrow or stiff. Untreated or uncontrolled hypertension can cause a heart attack, heart failure, a stroke, kidney disease, and other problems. A blood pressure reading consists of a higher number over a lower number. Ideally, your blood pressure should be below 120/80. The first ("top") number is called the systolic pressure. It is a measure of the pressure in your arteries as your heart beats. The second ("bottom") number is called the diastolic pressure. It is a measure of the pressure in your arteries as the heart relaxes. What are the causes? The exact cause of this condition is not known. There are some conditions that result in or are related to high blood pressure. What increases the risk? Some risk factors for high blood pressure are under your control. The following factors may make you more likely to develop this condition:  Smoking.  Having type 2 diabetes mellitus, high cholesterol, or both.  Not getting enough exercise or physical activity.  Being overweight.  Having too much fat, sugar, calories, or salt (sodium) in your diet.  Drinking too much alcohol. Some risk factors for high blood pressure may be difficult or impossible to change. Some of these factors include:  Having chronic kidney disease.  Having a family history of high blood pressure.  Age. Risk increases with age.  Race. You may be at higher risk if you are African American.  Gender. Men are at higher risk than  women before age 64. After age 25, women are at higher risk than men.  Having obstructive sleep apnea.  Stress. What are the signs or symptoms? High blood pressure may not cause symptoms. Very high blood pressure (hypertensive crisis) may cause:  Headache.  Anxiety.  Shortness of breath.  Nosebleed.  Nausea and vomiting.  Vision changes.  Severe chest pain.  Seizures. How is this diagnosed? This condition is diagnosed by measuring your blood pressure while you are seated, with your arm resting on a flat surface, your legs uncrossed, and your feet flat on the floor. The cuff of the blood pressure monitor will be placed directly against the skin of your upper arm at the level of your heart. It should be measured at least twice using the same arm. Certain conditions can cause a difference in blood pressure between your right and left arms. Certain factors can cause blood pressure readings to be lower or higher than normal for a short period of time:  When your blood pressure is higher when you are in a health care provider's office than when you are at home, this is called white coat hypertension. Most people with this condition do not need medicines.  When your blood pressure is higher at home than when you are in a health care provider's office, this is called masked hypertension. Most people with this condition may need medicines to control blood pressure. If you have a high blood pressure reading during one visit or you have normal blood pressure with other risk factors, you may be asked to:  Return on a different day to have your blood pressure checked again.  Monitor your blood pressure at home for 1 week or longer. If you are diagnosed with hypertension, you may have other blood or imaging tests to help your health care provider understand your overall risk for other conditions. How is this treated? This condition is treated by making healthy lifestyle changes, such as eating  healthy foods, exercising more, and reducing your alcohol intake. Your health care provider may prescribe medicine if lifestyle changes are not enough to get your blood pressure under control, and if:  Your systolic blood pressure is above 130.  Your diastolic blood pressure is above 80. Your personal target blood pressure may vary depending on your medical conditions, your age, and other factors. Follow these instructions at home: Eating and drinking   Eat a diet that is high in fiber and potassium, and low in sodium, added sugar, and fat. An example eating plan is called the DASH (Dietary Approaches to Stop Hypertension) diet. To eat this way: ? Eat plenty of fresh fruits and vegetables. Try to fill one half of your plate at each meal with fruits and vegetables. ? Eat whole grains, such as whole-wheat pasta, brown rice, or whole-grain bread. Fill about one fourth of your plate with whole grains. ? Eat or drink low-fat dairy products, such as skim milk or low-fat yogurt. ? Avoid fatty cuts of meat, processed or cured meats, and poultry with skin. Fill about one fourth of your plate with lean proteins, such as fish, chicken without skin, beans, eggs, or tofu. ? Avoid pre-made and processed foods. These tend to be higher in sodium, added sugar, and fat.  Reduce your daily sodium intake. Most people with hypertension should eat less than 1,500 mg of sodium a day.  Do not drink alcohol if: ? Your health care provider tells you not to drink. ? You are pregnant, may be pregnant, or are planning to become pregnant.  If you drink alcohol: ? Limit how much you use to:  0-1 drink a day for women.  0-2 drinks a day for men. ? Be aware of how much alcohol is in your drink. In the [place] one drink equals one 12 oz bottle of beer (355 mL), one 5 oz glass of wine (148 mL), or one 1 oz glass of hard liquor (44 mL). Lifestyle   Work with your health care provider to maintain a healthy body weight or  to lose weight. Ask what an ideal weight is for you.  Get at least 30 minutes of exercise most days of the week. Activities may include walking, swimming, or biking.  Include exercise to strengthen your muscles (resistance exercise), such as Pilates or lifting weights, as part of your weekly exercise routine. Try to do these types of exercises for 30 minutes at least 3 days a week.  Do not use any products that contain nicotine or tobacco, such as cigarettes, e-cigarettes, and chewing tobacco. If you need help quitting, ask your health care provider.  Monitor your blood pressure at home as told by your health care provider.  Keep all follow-up visits as told by your health care provider. This is important. Medicines  Take over-the-counter and prescription medicines only as told by your health care provider. Follow directions carefully. Blood pressure medicines must be taken as prescribed.  Do not skip doses of blood pressure medicine. Doing this puts you at risk for problems and can make the medicine less effective.  Ask your health care provider about side effects or reactions to medicines  that you should watch for. Contact a health care provider if you:  Think you are having a reaction to a medicine you are taking.  Have headaches that keep coming back (recurring).  Feel dizzy.  Have swelling in your ankles.  Have trouble with your vision. Get help right away if you:  Develop a severe headache or confusion.  Have unusual weakness or numbness.  Feel faint.  Have severe pain in your chest or abdomen.  Vomit repeatedly.  Have trouble breathing. Summary  Hypertension is when the force of blood pumping through your arteries is too strong. If this condition is not controlled, it may put you at risk for serious complications.  Your personal target blood pressure may vary depending on your medical conditions, your age, and other factors. For most people, a normal blood  pressure is less than 120/80.  Hypertension is treated with lifestyle changes, medicines, or a combination of both. Lifestyle changes include losing weight, eating a healthy, low-sodium diet, exercising more, and limiting alcohol. This information is not intended to replace advice given to you by your health care provider. Make sure you discuss any questions you have with your health care provider. Document Revised: 05/16/2018 Document Reviewed: 05/16/2018 Elsevier Patient Education  2020 [employer].

## 2019-11-05 ENCOUNTER — Ambulatory Visit: Payer: Managed Care, Other (non HMO) | Admitting: General Practice

## 2019-11-05 ENCOUNTER — Other Ambulatory Visit: Payer: Self-pay

## 2019-11-05 DIAGNOSIS — Z7901 Long term (current) use of anticoagulants: Secondary | ICD-10-CM

## 2019-11-05 LAB — POCT INR: INR: 2.4 (ref 2.0–3.0)

## 2019-11-05 NOTE — Progress Notes (Signed)
Medical screening examination/treatment/procedure(s) were performed by non-physician practitioner and as supervising physician I was immediately available for consultation/collaboration. I agree with above. Kaya Klausing, MD   

## 2019-11-05 NOTE — Patient Instructions (Addendum)
Pre visit review using our clinic review tool, if applicable. No additional management support is needed unless otherwise documented below in the visit note.  Continue to take 1 tablet daily except 1 1/2 tablets on Mon/Wed/Fridays.  Re-check  In 6 weeks

## 2019-11-18 ENCOUNTER — Other Ambulatory Visit: Payer: Self-pay | Admitting: Family Medicine

## 2019-12-09 ENCOUNTER — Encounter: Payer: Self-pay | Admitting: Family Medicine

## 2019-12-09 MED ORDER — ATORVASTATIN CALCIUM 20 MG PO TABS: ORAL_TABLET | ORAL | 3 refills | Status: DC

## 2019-12-17 ENCOUNTER — Other Ambulatory Visit: Payer: Self-pay

## 2019-12-17 ENCOUNTER — Ambulatory Visit: Payer: Managed Care, Other (non HMO) | Admitting: General Practice

## 2019-12-17 DIAGNOSIS — Z7901 Long term (current) use of anticoagulants: Secondary | ICD-10-CM | POA: Diagnosis not present

## 2019-12-17 LAB — POCT INR: INR: 2.4 (ref 2.0–3.0)

## 2019-12-17 NOTE — Progress Notes (Signed)
Medical screening examination/treatment/procedure(s) were performed by non-physician practitioner and as supervising physician I was immediately available for consultation/collaboration. I agree with above. Requan Hardge, MD   

## 2019-12-17 NOTE — Patient Instructions (Addendum)
Pre visit review using our clinic review tool, if applicable. No additional management support is needed unless otherwise documented below in the visit note.  Continue to take 1 tablet daily except 1 1/2 tablets on Mon/Wed/Fridays.  Re-check  In 6 weeks

## 2020-01-28 ENCOUNTER — Other Ambulatory Visit: Payer: Self-pay

## 2020-01-28 ENCOUNTER — Ambulatory Visit: Payer: Managed Care, Other (non HMO) | Admitting: General Practice

## 2020-01-28 DIAGNOSIS — Z7901 Long term (current) use of anticoagulants: Secondary | ICD-10-CM | POA: Diagnosis not present

## 2020-01-28 LAB — POCT INR: INR: 2.1 (ref 2.0–3.0)

## 2020-01-28 NOTE — Patient Instructions (Signed)
Pre visit review using our clinic review tool, if applicable. No additional management support is needed unless otherwise documented below in the visit note.  Take extra 1/2 tablet today(1 1/2 tablets) and the continue to take 1 tablet daily except 1 1/2 tablets on Mon/Wed/Fridays.  Re-check  In 6 weeks

## 2020-03-10 ENCOUNTER — Other Ambulatory Visit: Payer: Self-pay

## 2020-03-10 ENCOUNTER — Ambulatory Visit: Payer: Managed Care, Other (non HMO) | Admitting: General Practice

## 2020-03-10 DIAGNOSIS — Z7901 Long term (current) use of anticoagulants: Secondary | ICD-10-CM | POA: Diagnosis not present

## 2020-03-10 DIAGNOSIS — I4891 Unspecified atrial fibrillation: Secondary | ICD-10-CM | POA: Diagnosis not present

## 2020-03-10 LAB — POCT INR: INR: 2.7 (ref 2.0–3.0)

## 2020-03-10 NOTE — Patient Instructions (Addendum)
Pre visit review using our clinic review tool, if applicable. No additional management support is needed unless otherwise documented below in the visit note.  Continue to take 1 tablet daily except 1 1/2 tablets on Mon/Wed/Fridays.  Re-check in 6 weeks. 

## 2020-03-18 NOTE — Progress Notes (Signed)
Medical screening examination/treatment/procedure(s) were performed by non-physician practitioner and as supervising physician I was immediately available for consultation/collaboration. I agree with above. Kallee Nam, MD   

## 2020-04-14 ENCOUNTER — Other Ambulatory Visit: Payer: Self-pay | Admitting: Medical

## 2020-04-14 ENCOUNTER — Other Ambulatory Visit: Payer: Self-pay

## 2020-04-14 DIAGNOSIS — G629 Polyneuropathy, unspecified: Secondary | ICD-10-CM

## 2020-04-14 MED ORDER — GABAPENTIN 800 MG PO TABS: 800.0000 mg | ORAL_TABLET | Freq: Three times a day (TID) | ORAL | 1 refills | Status: DC

## 2020-04-21 ENCOUNTER — Ambulatory Visit: Payer: Managed Care, Other (non HMO) | Admitting: Family Medicine

## 2020-04-28 ENCOUNTER — Ambulatory Visit: Payer: Managed Care, Other (non HMO) | Admitting: General Practice

## 2020-04-28 ENCOUNTER — Other Ambulatory Visit: Payer: Self-pay

## 2020-04-28 DIAGNOSIS — I4891 Unspecified atrial fibrillation: Secondary | ICD-10-CM

## 2020-04-28 DIAGNOSIS — Z7901 Long term (current) use of anticoagulants: Secondary | ICD-10-CM | POA: Diagnosis not present

## 2020-04-28 LAB — POCT INR: INR: 2.6 (ref 2.0–3.0)

## 2020-04-28 NOTE — Patient Instructions (Addendum)
Pre visit review using our clinic review tool, if applicable. No additional management support is needed unless otherwise documented below in the visit note.  Continue to take 1 tablet daily except 1 1/2 tablets on Mon/Wed/Fridays.  Re-check in 6 weeks. 

## 2020-04-28 NOTE — Progress Notes (Signed)
Medical screening examination/treatment/procedure(s) were performed by non-physician practitioner and as supervising physician I was immediately available for consultation/collaboration. I agree with above. Kardell Virgil, MD   

## 2020-05-04 ENCOUNTER — Other Ambulatory Visit: Payer: Self-pay

## 2020-05-04 ENCOUNTER — Ambulatory Visit (INDEPENDENT_AMBULATORY_CARE_PROVIDER_SITE_OTHER): Payer: Managed Care, Other (non HMO) | Admitting: Medical

## 2020-05-04 ENCOUNTER — Encounter: Payer: Self-pay | Admitting: Medical

## 2020-05-04 VITALS — BP 140/80 | HR 60 | Resp 20 | Ht 75.0 in | Wt 220.0 lb

## 2020-05-04 DIAGNOSIS — Z0000 Encounter for general adult medical examination without abnormal findings: Secondary | ICD-10-CM

## 2020-05-04 DIAGNOSIS — Z125 Encounter for screening for malignant neoplasm of prostate: Secondary | ICD-10-CM | POA: Diagnosis not present

## 2020-05-04 NOTE — Patient Instructions (Addendum)
For you wellness exam today I have ordered cbc, cmp, psa and lipid panel.   Flu vaccine recommended around October or sooner.   Recommend exercise and healthy diet.  We will let you know lab results as they come in.  Follow up date appointment will be determined after lab review.    Preventive Care 83-63 Years Old, Male Preventive care refers to lifestyle choices and visits with your health care provider that can promote health and wellness. This includes:  A yearly physical exam. This is also called an annual well check.  Regular dental and eye exams.  Immunizations.  Screening for certain conditions.  Healthy lifestyle choices, such as eating a healthy diet, getting regular exercise, not using drugs or products that contain nicotine and tobacco, and limiting alcohol use. What can I expect for my preventive care visit? Physical exam Your health care provider will check:  Height and weight. These may be used to calculate body mass index (BMI), which is a measurement that tells if you are at a healthy weight.  Heart rate and blood pressure.  Your skin for abnormal spots. Counseling Your health care provider may ask you questions about:  Alcohol, tobacco, and drug use.  Emotional well-being.  Home and relationship well-being.  Sexual activity.  Eating habits.  Work and work Statistician. What immunizations do I need?  Influenza (flu) vaccine  This is recommended every year. Tetanus, diphtheria, and pertussis (Tdap) vaccine  You may need a Td booster every 10 years. Varicella (chickenpox) vaccine  You may need this vaccine if you have not already been vaccinated. Zoster (shingles) vaccine  You may need this after age 42. Measles, mumps, and rubella (MMR) vaccine  You may need at least one dose of MMR if you were born in 1957 or later. You may also need a second dose. Pneumococcal conjugate (PCV13) vaccine  You may need this if you have certain conditions  and were not previously vaccinated. Pneumococcal polysaccharide (PPSV23) vaccine  You may need one or two doses if you smoke cigarettes or if you have certain conditions. Meningococcal conjugate (MenACWY) vaccine  You may need this if you have certain conditions. Hepatitis A vaccine  You may need this if you have certain conditions or if you travel or work in places where you may be exposed to hepatitis A. Hepatitis B vaccine  You may need this if you have certain conditions or if you travel or work in places where you may be exposed to hepatitis B. Haemophilus influenzae type b (Hib) vaccine  You may need this if you have certain risk factors. Human papillomavirus (HPV) vaccine  If recommended by your health care provider, you may need three doses over 6 months. You may receive vaccines as individual doses or as more than one vaccine together in one shot (combination vaccines). Talk with your health care provider about the risks and benefits of combination vaccines. What tests do I need? Blood tests  Lipid and cholesterol levels. These may be checked every 5 years, or more frequently if you are over 27 years old.  Hepatitis C test.  Hepatitis B test. Screening  Lung cancer screening. You may have this screening every year starting at age 4 if you have a 30-pack-year history of smoking and currently smoke or have quit within the past 15 years.  Prostate cancer screening. Recommendations will vary depending on your family history and other risks.  Colorectal cancer screening. All adults should have this screening starting at age  78 and continuing until age 53. Your health care provider may recommend screening at age 75 if you are at increased risk. You will have tests every 1-10 years, depending on your results and the type of screening test.  Diabetes screening. This is done by checking your blood sugar (glucose) after you have not eaten for a while (fasting). You may have this  done every 1-3 years.  Sexually transmitted disease (STD) testing. Follow these instructions at home: Eating and drinking  Eat a diet that includes fresh fruits and vegetables, whole grains, lean protein, and low-fat dairy products.  Take vitamin and mineral supplements as recommended by your health care provider.  Do not drink alcohol if your health care provider tells you not to drink.  If you drink alcohol: ? Limit how much you have to 0-2 drinks a day. ? Be aware of how much alcohol is in your drink. In the [place] one drink equals one 12 oz bottle of beer (355 mL), one 5 oz glass of wine (148 mL), or one 1 oz glass of hard liquor (44 mL). Lifestyle  Take daily care of your teeth and gums.  Stay active. Exercise for at least 30 minutes on 5 or more days each week.  Do not use any products that contain nicotine or tobacco, such as cigarettes, e-cigarettes, and chewing tobacco. If you need help quitting, ask your health care provider.  If you are sexually active, practice safe sex. Use a condom or other form of protection to prevent STIs (sexually transmitted infections).  Talk with your health care provider about taking a low-dose aspirin every day starting at age 107. What's next?  Go to your health care provider once a year for a well check visit.  Ask your health care provider how often you should have your eyes and teeth checked.  Stay up to date on all vaccines. This information is not intended to replace advice given to you by your health care provider. Make sure you discuss any questions you have with your health care provider. Document Revised: 08/30/2018 Document Reviewed: 08/30/2018 Elsevier Patient Education  2020 [employer].

## 2020-05-04 NOTE — Progress Notes (Signed)
Subjective:    Patient ID: Tyler Sims, male    DOB: 04/07/1956, 64 y.o.   MRN: [id number]  HPI  Pt in for cpe/wellness.  Pt ate oatmeal at 10 am.   Pt is semi retired. He works for [employer]. Pt walks at work. Gets 12,000-13,000 steps a day. Moderate healthy diet. Avoid cholesterol.    Pt knows 2021 should get colonoscopy.  Today at home bp was 134/72.       Review of Systems  Constitutional: Negative for chills, fatigue and fever.  HENT: Negative for congestion, drooling and ear pain.   Respiratory: Negative for cough, chest tightness, shortness of breath and wheezing.   Cardiovascular: Negative for chest pain and palpitations.  Gastrointestinal: Negative for abdominal pain, blood in stool, constipation, diarrhea and rectal pain.  Genitourinary: Negative for dysuria.  Musculoskeletal: Negative for back pain and neck pain.  Skin: Negative for rash.  Neurological: Negative for dizziness, syncope, light-headedness and headaches.  Hematological: Negative for adenopathy. Does not bruise/bleed easily.  Psychiatric/Behavioral: Negative for behavioral problems and confusion.    Past Medical History:  Diagnosis Date  . Abnormal transaminases   . Acid reflux   . Bradycardia    nocturnal  . Colon polyps 02/07/2012   Dr. Silvano Rusk  . Elevated sed rate 07/06/2017  . HTN (hypertension) 06/02/2014  . Low back pain 06/15/2015  . Measles ([place])   . Mitral regurgitation   . Mumps   . Neuropathy 01/10/2016  . NICM (nonischemic cardiomyopathy) ([place] [facility])   . Persistent atrial fibrillation ([place]) 08/24/2010  . Preventative health care 12/04/2013   Colonsocopy May '13 - adenomatous polyps  Immunizations Tdap Jan '14   . Stroke ([place]) 08/24/2010  . Systolic CHF ([place])   . Urinary hesitancy 12/21/2014  . Varicella   . Ventricular tachycardia, non-sustained ([facility])      Social History   Socioeconomic History  . Marital status: Married    Spouse name: Charlett Nose  . Number of  children: 2  . Years of education: 21  . Highest education level: Not on file  Occupational History  . Occupation: Health visitor: [employer]  Tobacco Use  . Smoking status: Never Smoker  . Smokeless tobacco: Never Used  Vaping Use  . Vaping Use: Never used  Substance and Sexual Activity  . Alcohol use: Yes    Alcohol/week: 0.0 standard drinks    Comment: occasional wine  . Drug use: No  . Sexual activity: Yes    Partners: Female    Comment: lives with wife, no dietary restrictions, except avoid dairy, uses Lactaid, works with [employer]  Other Topics Concern  . Not on file  Social History Narrative   Patient is married with 2 children. Store Scientist, clinical (histocompatibility and immunogenetics) [employer]   1 daughter attorney in [place]   Other daughter [employer] grad - pursuing PhD in [place]   Patient is right handed.   Patient has 16 years of education.   Patient drinks 1-2 cups daily.   Social Determinants of Health   Financial Resource Strain:   . Difficulty of Paying Living Expenses:   Food Insecurity:   . Worried About Charity fundraiser in the Last Year:   . Arboriculturist in the Last Year:   Transportation Needs:   . Film/video editor (Medical):   Marland Kitchen Lack of Transportation (Non-Medical):   Physical Activity:   . Days of Exercise per Week:   .  Minutes of Exercise per Session:   Stress:   . Feeling of Stress :   Social Connections:   . Frequency of Communication with Friends and Family:   . Frequency of Social Gatherings with Friends and Family:   . Attends Religious Services:   . Active Member of Clubs or Organizations:   . Attends Archivist Meetings:   Marland Kitchen Marital Status:   Intimate Partner Violence:   . Fear of Current or Ex-Partner:   . Emotionally Abused:   Marland Kitchen Physically Abused:   . Sexually Abused:     Past Surgical History:  Procedure Laterality Date  . CARDIAC CATHETERIZATION    . CARDIOVERSION  08/15/2012   Procedure: CARDIOVERSION;  Surgeon:  Deboraha Sprang, MD;  Location: [facility] ENDOSCOPY;  Service: Cardiovascular;  Laterality: N/A;  . CARDIOVERSION N/A 05/28/2018   Procedure: CARDIOVERSION;  Surgeon: Fay Records, MD;  Location: [facility];  Service: Cardiovascular;  Laterality: N/A;  . COLONOSCOPY W/ BIOPSIES  02/07/2012   Dr. Silvano Rusk    Family History  Problem Relation Age of Onset  . Alzheimer's disease Mother   . Lung cancer Brother        x 2, smokers  . Hypertension Brother   . Heart disease Brother   . Cancer Brother   . Lung cancer Brother   . Hypertension Brother   . Colon polyps Brother   . Hypertension Brother   . Intellectual disability Brother   . Hypertension Sister   . Neuropathy Sister   . Arthritis Sister   . Heart disease Father        CAD/MI  . Hypertension Father   . Mental illness Father        h/o depression requiring hospitalization  . Hyperlipidemia Sister        x 2  . Hypertension Sister   . Arthritis Sister   . Obesity Sister   . Varicose Veins Daughter   . Birth defects Paternal Aunt     Allergies  Allergen Reactions  . Lisinopril Rash  . Simvastatin Rash    Current Outpatient Medications on File Prior to Visit  Medication Sig Dispense Refill  . atorvastatin (LIPITOR) 20 MG tablet TAKE 1 TABLET DAILY AT 6 P.M. 90 tablet 3  . Chlorpheniramine-Acetaminophen (CORICIDIN HBP COLD/FLU PO) Take by mouth.    . cholecalciferol (VITAMIN D) 1000 units tablet Take 1 tablet (1,000 Units total) by mouth daily. (Patient taking differently: Take 1,000 Units by mouth every other day. )    . COD LIVER OIL PO Take 1 capsule by mouth daily.     . Coenzyme Q10 (CO Q-10) 200 MG CAPS Take 200 mg by mouth daily.     . diphenhydramine-acetaminophen (TYLENOL PM) 25-500 MG TABS tablet Take 2 tablets by mouth at bedtime as needed (pain / sleep).    . folic acid (FOLVITE) 1 MG tablet TAKE 1 TABLET BY MOUTH EVERY DAY 90 tablet 1  . gabapentin (NEURONTIN) 800 MG tablet Take 1 tablet (800 mg total) by  mouth 3 (three) times daily. 270 tablet 1  . losartan (COZAAR) 25 MG tablet Take 1 tablet (25 mg total) by mouth daily. 90 tablet 3  . metoprolol succinate (TOPROL-XL) 25 MG 24 hr tablet TAKE 1 TABLET DAILY 90 tablet 3  . spironolactone (ALDACTONE) 25 MG tablet Take 1 tablet (25 mg total) by mouth daily. 90 tablet 3  . thiamine (VITAMIN B-1) 100 MG tablet Take 1 tablet (100 mg total) by  mouth daily. 30 tablet 5  . warfarin (COUMADIN) 5 MG tablet TAKE ONE AND ONE-HALF TABLETS DAILY EXCEPT 1 TABLET ON MONDAY, WEDNESDAY, AND FRIDAYS OR AS DIRECTED BY ANTICOAGULATION CLINIC 120 tablet 3   No current facility-administered medications on file prior to visit.    BP (!) 153/89 (BP Location: Left Arm, Patient Position: Sitting, Cuff Size: Normal)   Pulse 60   Resp 20   Ht 6\' 3"  (1.905 m)   Wt 220 lb (99.8 kg)   SpO2 95%   BMI 27.50 kg/m       Objective:   Physical Exam  General Mental Status- Alert. General Appearance- Not in acute distress.   Skin General: Color- Normal Color. Moisture- Normal Moisture.  Neck Carotid Arteries- Normal color. Moisture- Normal Moisture. No carotid bruits. No JVD.  Chest and Lung Exam Auscultation: Breath Sounds:-Normal.  Cardiovascular Auscultation:Rythm- Regular. Murmurs & Other Heart Sounds:Auscultation of the heart reveals- No Murmurs.  Abdomen Inspection:-Inspeection Normal. Palpation/Percussion:Note:No mass. Palpation and Percussion of the abdomen reveal- Non Tender, Non Distended + BS, no rebound or guarding.    Neurologic Cranial Nerve exam:- CN III-XII intact(No nystagmus), symmetric smile. Strength:- 5/5 equal and symmetric strength both upper and lower extremities.  Rectal- deferred.     Assessment & Plan:  For you wellness exam today I have ordered cbc, cmp, psa and lipid panel.  Flu vaccine recommended around October or sooner.   Recommend exercise and healthy diet.  We will let you know lab results as they come  in.  Follow up date appointment will be determined after lab review.   Mackie Pai, PA-C

## 2020-05-05 LAB — CBC WITH DIFFERENTIAL/PLATELET
Basophils Absolute: 0.1 10*3/uL (ref 0.0–0.1)
Basophils Relative: 1 % (ref 0.0–3.0)
Eosinophils Absolute: 0.1 10*3/uL (ref 0.0–0.7)
Eosinophils Relative: 2 % (ref 0.0–5.0)
HCT: 41.1 % (ref 39.0–52.0)
Hemoglobin: 14.1 g/dL (ref 13.0–17.0)
Lymphocytes Relative: 34 % (ref 12.0–46.0)
Lymphs Abs: 1.8 10*3/uL (ref 0.7–4.0)
MCHC: 34.4 g/dL (ref 30.0–36.0)
MCV: 94.7 fl (ref 78.0–100.0)
Monocytes Absolute: 0.8 10*3/uL (ref 0.1–1.0)
Monocytes Relative: 14.5 % — ABNORMAL HIGH (ref 3.0–12.0)
Neutro Abs: 2.6 10*3/uL (ref 1.4–7.7)
Neutrophils Relative %: 48.5 % (ref 43.0–77.0)
Platelets: 206 10*3/uL (ref 150.0–400.0)
RBC: 4.34 Mil/uL (ref 4.22–5.81)
RDW: 13.4 % (ref 11.5–15.5)
WBC: 5.4 10*3/uL (ref 4.0–10.5)

## 2020-05-05 LAB — LIPID PANEL
Cholesterol: 147 mg/dL (ref 0–200)
HDL: 56.5 mg/dL (ref >39.00)
LDL Cholesterol: 72 mg/dL (ref 0–99)
NonHDL: 90.44
Total CHOL/HDL Ratio: 3
Triglycerides: 93 mg/dL (ref 0.0–149.0)
VLDL: 18.6 mg/dL (ref 0.0–40.0)

## 2020-05-05 LAB — COMPREHENSIVE METABOLIC PANEL
ALT: 15 U/L (ref 0–53)
AST: 20 U/L (ref 0–37)
Albumin: 4.5 g/dL (ref 3.5–5.2)
Alkaline Phosphatase: 75 U/L (ref 39–117)
BUN: 18 mg/dL (ref 6–23)
CO2: 29 mEq/L (ref 19–32)
Calcium: 10.4 mg/dL (ref 8.4–10.5)
Chloride: 103 mEq/L (ref 96–112)
Creatinine, Ser: 1.08 mg/dL (ref 0.40–1.50)
GFR: 83.63 mL/min (ref >60.00)
Glucose, Bld: 90 mg/dL (ref 70–99)
Potassium: 4.7 mEq/L (ref 3.5–5.1)
Sodium: 138 mEq/L (ref 135–145)
Total Bilirubin: 1.2 mg/dL (ref 0.2–1.2)
Total Protein: 7.9 g/dL (ref 6.0–8.3)

## 2020-05-05 LAB — PSA: PSA: 1.09 ng/mL (ref 0.10–4.00)

## 2020-05-13 ENCOUNTER — Other Ambulatory Visit: Payer: Self-pay | Admitting: Family Medicine

## 2020-06-16 ENCOUNTER — Other Ambulatory Visit: Payer: Self-pay

## 2020-06-16 ENCOUNTER — Ambulatory Visit: Payer: Managed Care, Other (non HMO)

## 2020-06-23 ENCOUNTER — Other Ambulatory Visit: Payer: Self-pay

## 2020-06-23 ENCOUNTER — Ambulatory Visit (INDEPENDENT_AMBULATORY_CARE_PROVIDER_SITE_OTHER): Payer: Managed Care, Other (non HMO) | Admitting: General Practice

## 2020-06-23 DIAGNOSIS — Z7901 Long term (current) use of anticoagulants: Secondary | ICD-10-CM

## 2020-06-23 DIAGNOSIS — Z23 Encounter for immunization: Secondary | ICD-10-CM

## 2020-06-23 DIAGNOSIS — I4891 Unspecified atrial fibrillation: Secondary | ICD-10-CM

## 2020-06-23 LAB — POCT INR: INR: 2.3 (ref 2.0–3.0)

## 2020-06-23 NOTE — Patient Instructions (Signed)
Pre visit review using our clinic review tool, if applicable. No additional management support is needed unless otherwise documented below in the visit note.  Continue to take 1 tablet daily except 1 1/2 tablets on Mon/Wed/Fridays.  Re-check in 6 weeks. 

## 2020-07-20 ENCOUNTER — Other Ambulatory Visit: Payer: Self-pay | Admitting: Internal Medicine

## 2020-07-27 ENCOUNTER — Ambulatory Visit: Payer: Managed Care, Other (non HMO) | Admitting: Student

## 2020-07-29 NOTE — Progress Notes (Signed)
PCP:  Mosie Lukes, MD Primary Cardiologist: No primary care provider on file. Electrophysiologist: Virl Axe, MD   Tyler Sims is a 64 y.o. male seen today for Virl Axe, MD for routine electrophysiology followup.   He has h/o CVA, PAF, NICM, and HTN  Since last being seen in our clinic the patient reports doing very well. His HRs range in 60-90s with activity.  he denies chest pain, palpitations, dyspnea, PND, orthopnea, nausea, vomiting, dizziness, syncope, edema, weight gain, or early satiety.  Past Medical History:  Diagnosis Date  . Abnormal transaminases   . Acid reflux   . Bradycardia    nocturnal  . Colon polyps 02/07/2012   Dr. Silvano Rusk  . Elevated sed rate 07/06/2017  . HTN (hypertension) 06/02/2014  . Low back pain 06/15/2015  . Measles ([place])   . Mitral regurgitation   . Mumps   . Neuropathy 01/10/2016  . NICM (nonischemic cardiomyopathy) ([place])   . Persistent atrial fibrillation ([place]) 08/24/2010  . Preventative health care 12/04/2013   Colonsocopy May '13 - adenomatous polyps  Immunizations Tdap Jan '14   . Stroke ([place]) 08/24/2010  . Systolic CHF ([place])   . Urinary hesitancy 12/21/2014  . Varicella   . Ventricular tachycardia, non-sustained [facility])    Past Surgical History:  Procedure Laterality Date  . CARDIAC CATHETERIZATION    . CARDIOVERSION  08/15/2012   Procedure: CARDIOVERSION;  Surgeon: Deboraha Sprang, MD;  Location: [facility] ENDOSCOPY;  Service: Cardiovascular;  Laterality: N/A;  . CARDIOVERSION N/A 05/28/2018   Procedure: CARDIOVERSION;  Surgeon: Fay Records, MD;  Location: [facility] ENDOSCOPY;  Service: Cardiovascular;  Laterality: N/A;  . COLONOSCOPY W/ BIOPSIES  02/07/2012   Dr. Silvano Rusk    Current Outpatient Medications  Medication Sig Dispense Refill  . atorvastatin (LIPITOR) 20 MG tablet TAKE 1 TABLET DAILY AT 6 P.M. 90 tablet 3  . cholecalciferol (VITAMIN D) 1000 units tablet Take 1 tablet (1,000 Units total) by mouth daily. (Patient  taking differently: Take 1,000 Units by mouth every other day. )    . COD LIVER OIL PO Take 1 capsule by mouth daily.     . Coenzyme Q10 (CO Q-10) 200 MG CAPS Take 200 mg by mouth daily.     . diphenhydramine-acetaminophen (TYLENOL PM) 25-500 MG TABS tablet Take 2 tablets by mouth at bedtime as needed (pain / sleep).    . folic acid (FOLVITE) 1 MG tablet Take 1 tablet (1 mg total) by mouth daily. 90 tablet 3  . gabapentin (NEURONTIN) 800 MG tablet Take 1 tablet (800 mg total) by mouth 3 (three) times daily. 270 tablet 1  . losartan (COZAAR) 25 MG tablet Take 1 tablet (25 mg total) by mouth daily. 90 tablet 3  . metoprolol succinate (TOPROL-XL) 25 MG 24 hr tablet TAKE 1 TABLET DAILY 90 tablet 3  . spironolactone (ALDACTONE) 25 MG tablet TAKE 1 TABLET DAILY 30 tablet 0  . thiamine (VITAMIN B-1) 100 MG tablet Take 1 tablet (100 mg total) by mouth daily. 30 tablet 5  . warfarin (COUMADIN) 5 MG tablet TAKE ONE AND ONE-HALF TABLETS DAILY EXCEPT 1 TABLET ON MONDAY, WEDNESDAY, AND FRIDAYS OR AS DIRECTED BY ANTICOAGULATION CLINIC 120 tablet 3   No current facility-administered medications for this visit.    Allergies  Allergen Reactions  . Lisinopril Rash  . Simvastatin Rash    Social History   Socioeconomic History  . Marital status: Married    Spouse name: Charlett Nose  . Number  of children: 2  . Years of education: 40  . Highest education level: Not on file  Occupational History  . Occupation: Health visitor: [employer]  Tobacco Use  . Smoking status: Never Smoker  . Smokeless tobacco: Never Used  Vaping Use  . Vaping Use: Never used  Substance and Sexual Activity  . Alcohol use: Yes    Alcohol/week: 0.0 standard drinks    Comment: occasional wine  . Drug use: No  . Sexual activity: Yes    Partners: Female    Comment: lives with wife, no dietary restrictions, except avoid dairy, uses Lactaid, works with [employer]  Other Topics Concern  . Not on file    Social History Narrative   Patient is married with 2 children. 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   Patient has 16 years of education.   Patient drinks 1-2 cups daily.   Social Determinants of Health   Financial Resource Strain:   . Difficulty of Paying Living Expenses: Not on file  Food Insecurity:   . Worried About Charity fundraiser in the Last Year: Not on file  . Ran Out of Food in the Last Year: Not on file  Transportation Needs:   . Lack of Transportation (Medical): Not on file  . Lack of Transportation (Non-Medical): Not on file  Physical Activity:   . Days of Exercise per Week: Not on file  . Minutes of Exercise per Session: Not on file  Stress:   . Feeling of Stress : Not on file  Social Connections:   . Frequency of Communication with Friends and Family: Not on file  . Frequency of Social Gatherings with Friends and Family: Not on file  . Attends Religious Services: Not on file  . Active Member of Clubs or Organizations: Not on file  . Attends Archivist Meetings: Not on file  . Marital Status: Not on file  Intimate Partner Violence:   . Fear of Current or Ex-Partner: Not on file  . Emotionally Abused: Not on file  . Physically Abused: Not on file  . Sexually Abused: Not on file     Review of Systems: General: No chills, fever, night sweats or weight changes  Cardiovascular:  No chest pain, dyspnea on exertion, edema, orthopnea, palpitations, paroxysmal nocturnal dyspnea Dermatological: No rash, lesions or masses Respiratory: No cough, dyspnea Urologic: No hematuria, dysuria Abdominal: No nausea, vomiting, diarrhea, bright red blood per rectum, melena, or hematemesis Neurologic: No visual changes, weakness, changes in mental status All other systems reviewed and are otherwise negative except as noted above.  Physical Exam: Vitals:    07/30/20 0803  BP: 138/80  Pulse: 64  SpO2: 99%  Weight: 221 lb (100.2 kg)  Height: 6\' 3"  (1.905 m)    GEN- The patient is well appearing, alert and oriented x 3 today.   HEENT: normocephalic, atraumatic; sclera clear, conjunctiva pink; hearing intact; oropharynx clear; neck supple, no JVP Lymph- no cervical lymphadenopathy Lungs- Clear to ausculation bilaterally, normal work of breathing.  No wheezes, rales, rhonchi Heart- Regular rate and rhythm, no murmurs, rubs or gallops, PMI not laterally displaced GI- soft, non-tender, non-distended, bowel sounds present, no hepatosplenomegaly Extremities- no clubbing, cyanosis, or edema; DP/PT/radial pulses 2+ bilaterally MS- no significant deformity or atrophy Skin- warm and dry, no rash  or lesion Psych- euthymic mood, full affect Neuro- strength and sensation are intact  EKG is ordered. Personal review of EKG from today shows NSR at 64 bpm, PR interval 182 ms, QRS 92 ms  Additional studies reviewed include: Previous EP notes  Assessment and Plan:  1. Persistent atrial fibrillation, now paroxysmal. On coumadin chronically for CHA2DS2VASC of at least 4.   INR managed by PCP  2. Sinus bradycardia EKG today shows NSR in lower 60s as above.  3. HTN BP runs 130-140s at home.  Will increase losartan to 50 mg daily.  BMET in 2 weeks with high normal potassium in the past. We discussed limiting No-salt and Ms dash, and overall limiting potassium in his diet.   4. NICM EF 45-50% 04/2016 NYHA II-III symptoms currently We will update echo with advancements in HF medications since his last Echo in order to optimize his therapy.    RTC 1 year, sooner if EF lower.    Shirley Friar, PA-C  07/30/20 8:08 AM

## 2020-07-30 ENCOUNTER — Other Ambulatory Visit: Payer: Self-pay

## 2020-07-30 ENCOUNTER — Ambulatory Visit: Payer: Managed Care, Other (non HMO) | Admitting: Student

## 2020-07-30 ENCOUNTER — Encounter: Payer: Self-pay | Admitting: Student

## 2020-07-30 VITALS — BP 138/80 | HR 64 | Ht 75.0 in | Wt 221.0 lb

## 2020-07-30 DIAGNOSIS — I10 Essential (primary) hypertension: Secondary | ICD-10-CM | POA: Diagnosis not present

## 2020-07-30 DIAGNOSIS — I480 Paroxysmal atrial fibrillation: Secondary | ICD-10-CM | POA: Diagnosis not present

## 2020-07-30 DIAGNOSIS — I5022 Chronic systolic (congestive) heart failure: Secondary | ICD-10-CM

## 2020-07-30 DIAGNOSIS — R001 Bradycardia, unspecified: Secondary | ICD-10-CM

## 2020-07-30 MED ORDER — LOSARTAN POTASSIUM 50 MG PO TABS: 50.0000 mg | ORAL_TABLET | Freq: Every day | ORAL | 3 refills | Status: DC

## 2020-07-30 NOTE — Patient Instructions (Signed)
Medication Instructions:  Your physician has recommended you make the following change in your medication:  -- INCREASE Losartan to 50 mg - Take 1 tablet (50 mg) by mouth daily - NEW RX SENT *If you need a refill on your cardiac medications before your next appointment, please call your pharmacy*  Lab Work: Your physician recommends that you return for lab work in: [place] for a BMET -- 08/10/20 If you have labs (blood work) drawn today and your tests are completely normal, you will receive your results only by: Marland Kitchen MyChart Message (if you have MyChart) OR . A paper copy in the mail If you have any lab test that is abnormal or we need to change your treatment, we will call you to review the results.  Testing/Procedures: Your physician has requested that you have an echocardiogram (please try to coordinate with lab appt). Echocardiography is a painless test that uses sound waves to create images of your heart. It provides your doctor with information about the size and shape of your heart and how well your heart's chambers and valves are working. This procedure takes approximately one hour. There are no restrictions for this procedure.  Follow-Up: At [facility], you and your health needs are our priority.  As part of our continuing mission to provide you with exceptional heart care, we have created designated Provider Care Teams.  These Care Teams include your primary Cardiologist (physician) and Advanced Practice Providers (APPs -  Physician Assistants and Nurse Practitioners) who all work together to provide you with the care you need, when you need it.  We recommend signing up for the patient portal called "MyChart".  Sign up information is provided on this After Visit Summary.  MyChart is used to connect with patients for Virtual Visits (Telemedicine).  Patients are able to view lab/test results, encounter notes, upcoming appointments, etc.  Non-urgent messages can be sent to your provider as  well.   To learn more about what you can do with MyChart, go to [email or web address].    Your next appointment:   Your physician wants you to follow-up in: 1 YEAR with Dr. Caryl Comes. You will receive a reminder letter in the mail two months in advance. If you don't receive a letter, please call our office to schedule the follow-up appointment.  The format for your next appointment:   In Person with Virl Axe, MD

## 2020-08-04 ENCOUNTER — Ambulatory Visit: Payer: Managed Care, Other (non HMO) | Admitting: General Practice

## 2020-08-04 ENCOUNTER — Other Ambulatory Visit: Payer: Self-pay

## 2020-08-04 DIAGNOSIS — Z7901 Long term (current) use of anticoagulants: Secondary | ICD-10-CM

## 2020-08-04 DIAGNOSIS — I4891 Unspecified atrial fibrillation: Secondary | ICD-10-CM

## 2020-08-04 LAB — POCT INR: INR: 2.7 (ref 2.0–3.0)

## 2020-08-04 NOTE — Progress Notes (Signed)
Medical screening examination/treatment/procedure(s) were performed by non-physician practitioner and as supervising physician I was immediately available for consultation/collaboration. I agree with above. Lyman Balingit, MD   

## 2020-08-04 NOTE — Patient Instructions (Addendum)
Pre visit review using our clinic review tool, if applicable. No additional management support is needed unless otherwise documented below in the visit note.  Continue to take 1 tablet daily except 1 1/2 tablets on Mon/Wed/Fridays.  Re-check in 6 weeks. 

## 2020-08-10 ENCOUNTER — Other Ambulatory Visit: Payer: Managed Care, Other (non HMO) | Admitting: *Deleted

## 2020-08-10 ENCOUNTER — Other Ambulatory Visit: Payer: Self-pay

## 2020-08-10 DIAGNOSIS — I480 Paroxysmal atrial fibrillation: Secondary | ICD-10-CM

## 2020-08-10 DIAGNOSIS — I5022 Chronic systolic (congestive) heart failure: Secondary | ICD-10-CM

## 2020-08-10 LAB — BASIC METABOLIC PANEL
BUN/Creatinine Ratio: 18 (ref 10–24)
BUN: 18 mg/dL (ref 8–27)
CO2: 24 mmol/L (ref 20–29)
Calcium: 9.8 mg/dL (ref 8.6–10.2)
Chloride: 104 mmol/L (ref 96–106)
Creatinine, Ser: 1.01 mg/dL (ref 0.76–1.27)
GFR calc Af Amer: 92 mL/min/{1.73_m2} (ref >59)
GFR calc non Af Amer: 79 mL/min/{1.73_m2} (ref >59)
Glucose: 99 mg/dL (ref 65–99)
Potassium: 4.6 mmol/L (ref 3.5–5.2)
Sodium: 139 mmol/L (ref 134–144)

## 2020-08-22 ENCOUNTER — Other Ambulatory Visit: Payer: Self-pay | Admitting: Family Medicine

## 2020-08-24 ENCOUNTER — Ambulatory Visit (HOSPITAL_COMMUNITY): Payer: Managed Care, Other (non HMO) | Attending: Cardiology

## 2020-08-24 ENCOUNTER — Other Ambulatory Visit: Payer: Self-pay

## 2020-08-24 DIAGNOSIS — I5022 Chronic systolic (congestive) heart failure: Secondary | ICD-10-CM

## 2020-08-24 LAB — ECHOCARDIOGRAM COMPLETE
Area-P 1/2: 3.33 cm2
S' Lateral: 3.1 cm

## 2020-08-26 NOTE — Telephone Encounter (Signed)
Echo was discussed in detail with Oda Kilts via a phone call on 08/25/20

## 2020-09-14 ENCOUNTER — Encounter: Payer: Self-pay | Admitting: Family Medicine

## 2020-09-15 ENCOUNTER — Ambulatory Visit: Payer: Managed Care, Other (non HMO) | Admitting: General Practice

## 2020-09-15 ENCOUNTER — Other Ambulatory Visit: Payer: Self-pay

## 2020-09-15 DIAGNOSIS — Z7901 Long term (current) use of anticoagulants: Secondary | ICD-10-CM

## 2020-09-15 DIAGNOSIS — I4891 Unspecified atrial fibrillation: Secondary | ICD-10-CM

## 2020-09-15 LAB — POCT INR: INR: 2.6 (ref 2.0–3.0)

## 2020-09-15 NOTE — Patient Instructions (Addendum)
Pre visit review using our clinic review tool, if applicable. No additional management support is needed unless otherwise documented below in the visit note.  Continue to take 1 tablet daily except 1 1/2 tablets on Mon/Wed/Fridays.  Re-check in 6 weeks. 

## 2020-09-15 NOTE — Progress Notes (Signed)
Medical screening examination/treatment/procedure(s) were performed by non-physician practitioner and as supervising physician I was immediately available for consultation/collaboration. I agree with above. Neri Samek, MD   

## 2020-09-25 ENCOUNTER — Other Ambulatory Visit: Payer: Self-pay | Admitting: Internal Medicine

## 2020-09-28 ENCOUNTER — Other Ambulatory Visit: Payer: Self-pay | Admitting: Internal Medicine

## 2020-10-17 ENCOUNTER — Encounter: Payer: Self-pay | Admitting: Family Medicine

## 2020-10-17 DIAGNOSIS — G629 Polyneuropathy, unspecified: Secondary | ICD-10-CM

## 2020-10-19 MED ORDER — GABAPENTIN 800 MG PO TABS: 800.0000 mg | ORAL_TABLET | Freq: Three times a day (TID) | ORAL | 1 refills | Status: DC

## 2020-10-27 ENCOUNTER — Other Ambulatory Visit: Payer: Self-pay

## 2020-10-27 ENCOUNTER — Ambulatory Visit: Payer: Managed Care, Other (non HMO) | Admitting: General Practice

## 2020-10-27 DIAGNOSIS — I4891 Unspecified atrial fibrillation: Secondary | ICD-10-CM

## 2020-10-27 DIAGNOSIS — Z7901 Long term (current) use of anticoagulants: Secondary | ICD-10-CM | POA: Diagnosis not present

## 2020-10-27 LAB — POCT INR: INR: 3 (ref 2.0–3.0)

## 2020-10-27 NOTE — Progress Notes (Signed)
Medical screening examination/treatment/procedure(s) were performed by non-physician practitioner and as supervising physician I was immediately available for consultation/collaboration. I agree with above. James John, MD   

## 2020-10-27 NOTE — Patient Instructions (Signed)
Pre visit review using our clinic review tool, if applicable. No additional management support is needed unless otherwise documented below in the visit note.  Take 1 tablet tomorrow (2/9) and then continue to take 1 tablet daily except 1 1/2 tablets on Mon/Wed/Fridays.  Re-check in  6 weeks.

## 2020-11-16 ENCOUNTER — Other Ambulatory Visit: Payer: Self-pay | Admitting: Family Medicine

## 2020-12-08 ENCOUNTER — Ambulatory Visit: Payer: Managed Care, Other (non HMO) | Admitting: General Practice

## 2020-12-08 ENCOUNTER — Other Ambulatory Visit: Payer: Self-pay

## 2020-12-08 ENCOUNTER — Ambulatory Visit: Payer: Managed Care, Other (non HMO) | Admitting: Internal Medicine

## 2020-12-08 ENCOUNTER — Telehealth: Payer: Self-pay

## 2020-12-08 ENCOUNTER — Encounter: Payer: Self-pay | Admitting: Internal Medicine

## 2020-12-08 VITALS — BP 136/72 | HR 68 | Ht 74.0 in | Wt 222.5 lb

## 2020-12-08 DIAGNOSIS — Z86010 Personal history of colonic polyps: Secondary | ICD-10-CM

## 2020-12-08 DIAGNOSIS — Z7901 Long term (current) use of anticoagulants: Secondary | ICD-10-CM | POA: Diagnosis not present

## 2020-12-08 DIAGNOSIS — I4891 Unspecified atrial fibrillation: Secondary | ICD-10-CM

## 2020-12-08 LAB — POCT INR: INR: 2.2 (ref 2.0–3.0)

## 2020-12-08 NOTE — Progress Notes (Signed)
Tyler Sims 65 y.o. Jan 25, 1956 [id number]  Assessment & Plan:   Encounter Diagnoses  Name Primary?   History of colonic polyps Yes   Atrial fibrillation, unspecified type ([place])    Long term (current) use of anticoagulants    It is an appropriate time for surveillance colonoscopy.  He is on warfarin for stroke prophylaxis and atrial fibrillation.  It is appropriate to hold warfarin to reduce risk of bleeding from colonoscopy with polypectomy.  I have explained the rare but real increased risk of stroke off of warfarin.  He does have a prior history of stroke but that was a thrombotic stroke treated with TPA not related to A. fib.  We will query Dr. Caryl Sims his cardiologist regarding appropriateness of holding the warfarin I think it should be so.  Hold 5 days prior to procedure.  The other risks and benefits as well as alternatives of endoscopic procedure(s) have been discussed and reviewed. All questions answered. The patient agrees to proceed.  CC: Tyler Lukes, MD   Subjective:   Chief Complaint: History of colonic polyps  HPI Tyler Sims is a 65 year old African-American man with a history of adenomatous colon polyps, in 2013 he had a 1 cm and 2 mm adenoma.  No polyps in 2016.  He is not having any active GI symptoms.  He reports he is otherwise doing well.  Lab Results  Component Value Date   WBC 5.4 05/04/2020   HGB 14.1 05/04/2020   HCT 41.1 05/04/2020   MCV 94.7 05/04/2020   PLT 206.0 05/04/2020    Allergies  Allergen Reactions   Lisinopril Rash   Zocor [Simvastatin] Rash   Current Meds  Medication Sig   atorvastatin (LIPITOR) 20 MG tablet Take 1 tablet (20 mg total) by mouth daily.   cholecalciferol (VITAMIN D) 1000 units tablet Take 1 tablet (1,000 Units total) by mouth daily. (Patient taking differently: Take 1,000 Units by mouth every other day.)   COD LIVER OIL PO Take 1 capsule by mouth daily.    Coenzyme Q10 (CO Q-10) 200 MG CAPS Take 200  mg by mouth daily.    diphenhydramine-acetaminophen (TYLENOL PM) 25-500 MG TABS tablet Take 2 tablets by mouth at bedtime as needed (pain / sleep).   folic acid (FOLVITE) 1 MG tablet Take 1 tablet (1 mg total) by mouth daily.   gabapentin (NEURONTIN) 800 MG tablet Take 1 tablet (800 mg total) by mouth 3 (three) times daily.   losartan (COZAAR) 50 MG tablet Take 1 tablet (50 mg total) by mouth daily.   metoprolol succinate (TOPROL-XL) 25 MG 24 hr tablet Take 1 tablet (25 mg total) by mouth daily.   spironolactone (ALDACTONE) 25 MG tablet TAKE 1 TABLET DAILY   thiamine (VITAMIN B-1) 100 MG tablet Take 1 tablet (100 mg total) by mouth daily.   warfarin (COUMADIN) 5 MG tablet Take 1 tablet daily except take 1 1/2 tablets on Monday Wednesday and Fridays or Take as directed by anticoagulation clinic   Past Medical History:  Diagnosis Date   Abnormal transaminases    Acid reflux    Bradycardia    nocturnal   Colon polyps 02/07/2012   Dr. Silvano Rusk   Elevated sed rate 07/06/2017   HTN (hypertension) 06/02/2014   Low back pain 06/15/2015   Measles ([place])    Mitral regurgitation    Mumps    Neuropathy 01/10/2016   NICM (nonischemic cardiomyopathy) ([place])    Persistent atrial fibrillation ([place]) 08/24/2010   Preventative  health care 12/04/2013   Colonsocopy May '13 - adenomatous polyps  Immunizations Tdap Jan '14    Stroke [facility]) 27/01/1699   Systolic CHF ([place])    Urinary hesitancy 12/21/2014   Varicella    Ventricular tachycardia, non-sustained [facility])    Past Surgical History:  Procedure Laterality Date   CARDIAC CATHETERIZATION     CARDIOVERSION  08/15/2012   Procedure: CARDIOVERSION;  Surgeon: Deboraha Sprang, MD;  Location: [facility];  Service: Cardiovascular;  Laterality: N/A;   CARDIOVERSION N/A 05/28/2018   Procedure: CARDIOVERSION;  Surgeon: Fay Records, MD;  Location: [facility] ENDOSCOPY;  Service: Cardiovascular;  Laterality: N/A;   COLONOSCOPY W/ BIOPSIES   02/07/2012   Dr. Silvano Rusk   Social History   Social History Narrative   Patient is married with 2 children. 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   Patient has 16 years of education.   Patient drinks 1-2 cups caffeine daily.   Never smoker occasional wine no drug use   family history includes Alzheimer's disease in his mother; Arthritis in his sister and sister; Birth defects in his paternal aunt; Colon polyps in his brother; Heart disease in his brother and father; Hyperlipidemia in his sister; Hypertension in his brother, brother, brother, father, sister, and sister; Intellectual disability in his brother; Lung cancer in his brother and brother; Mental illness in his father; Neuropathy in his sister; Obesity in his sister; Varicose Veins in his daughter.   Review of Systems As above  Objective:   Physical Exam BP 136/72 (BP Location: Left Arm, Patient Position: Sitting, Cuff Size: Normal)    Pulse 68    Ht 6\' 2"  (1.88 m) Comment: height measured without shoes   Wt 222 lb 8 oz (100.9 kg)    BMI 28.57 kg/m  Well-developed well-nourished black man in no acute distress Eyes are anicteric Lungs are clear posteriorly Heart sounds show normal S1-S2 no rubs murmurs or gallops normal rhythm

## 2020-12-08 NOTE — Progress Notes (Signed)
Medical screening examination/treatment/procedure(s) were performed by non-physician practitioner and as supervising physician I was immediately available for consultation/collaboration. I agree with above. Joslyn Ramos, MD   

## 2020-12-08 NOTE — Patient Instructions (Addendum)
Pre visit review using our clinic review tool, if applicable. No additional management support is needed unless otherwise documented below in the visit note.  Continue to take 1 tablet daily except 1 1/2 tablets on Mon/Wed/Fridays.  Re-check in 6 weeks. 

## 2020-12-08 NOTE — Telephone Encounter (Signed)
[facility] Pre-operative Risk Assessment     Request for surgical clearance:     Endoscopy Procedure  What type of surgery is being performed?     colonoscopy  When is this surgery scheduled?     01/28/2021  What type of clearance is required ?   Pharmacy  Are there any medications that need to be held prior to surgery and how long? Warfarin, 5 days  Practice name and name of physician performing surgery?      [facility]  What is your office phone and fax number?      Phone- [phone]  [phone]  Anesthesia type (None, local, MAC, general) ?       MAC

## 2020-12-08 NOTE — Telephone Encounter (Signed)
Patient with diagnosis of atrial fibrillation on Warfain for anticoagulation.    Procedure: Colonoscopy Date of procedure: 01/28/21  CHA2DS2-VASc Score = 4  This indicates a 4.8% annual risk of stroke. The patient's score is based upon: CHF History: Yes HTN History: Yes Diabetes History: No Stroke History: Yes Vascular Disease History: No Age Score: 0 Gender Score: 0   CrCl 107 ml/min Platelet count 206K  Per office protocol, patient can hold warfarin for 5 days prior to procedure.    Patient will need bridging with Lovenox (enoxaparin) around procedure given history of CVA/afib. Lovenox can be dosed 100 mg BID or 150 mg daily.   Since warfarin managed by PCP, will defer Lovenox bridging to them.

## 2020-12-08 NOTE — Patient Instructions (Signed)
You have been scheduled for a colonoscopy. Please follow written instructions given to you at your visit today.  Please pick up your prep supplies at the pharmacy within the next 1-3 days. If you use inhalers (even only as needed), please bring them with you on the day of your procedure.  Due to recent changes in healthcare laws, you may see the results of your imaging and laboratory studies on MyChart before your provider has had a chance to review them.  We understand that in some cases there may be results that are confusing or concerning to you. Not all laboratory results come back in the same time frame and the provider may be waiting for multiple results in order to interpret others.  Please give [place] 48 hours in order for your provider to thoroughly review all the results before contacting the office for clarification of your results.   You will be contaced by our office prior to your procedure for directions on holding your Coumadin/Warfarin.  If you do not hear from our office 1 week prior to your scheduled procedure, please call [phone] to discuss.   I appreciate the opportunity to care for you. Silvano Rusk, MD, [facility]

## 2020-12-08 NOTE — Telephone Encounter (Signed)
   Primary Cardiologist: Tyler Axe, MD  Chart reviewed as part of pre-operative protocol coverage.Aadil Kingsbury's medications and past medical history of been reviewed by our clinical pharmacist.  Patient with diagnosis of atrial fibrillation on Warfain for anticoagulation.    Procedure: Colonoscopy Date of procedure: 01/28/21  CHA2DS2-VASc Score = 4  This indicates a 4.8% annual risk of stroke. The patient's score is based upon: CHF History: Yes HTN History: Yes Diabetes History: No Stroke History: Yes Vascular Disease History: No Age Score: 0 Gender Score: 0   CrCl 107 ml/min Platelet count 206K  Per office protocol, patient can hold warfarin for 5 days prior to procedure.    Patient will need bridging with Lovenox (enoxaparin) around proceduregiven history of CVA/afib. Lovenox can be dosed 100 mg BID or 150 mg daily.   Since warfarin managed by PCP, will defer Lovenox bridging to them.  I will route this recommendation to the requesting party via Epic fax function and remove from pre-op pool.  Please call with questions.  Jossie Ng. Cleaver NP-C    12/08/2020, 4:48 PM [facility] [address] Office [phone] Fax [phone]

## 2020-12-16 ENCOUNTER — Telehealth: Payer: Self-pay | Admitting: General Practice

## 2020-12-16 ENCOUNTER — Other Ambulatory Visit: Payer: Self-pay | Admitting: General Practice

## 2020-12-16 DIAGNOSIS — Z7901 Long term (current) use of anticoagulants: Secondary | ICD-10-CM

## 2020-12-16 MED ORDER — ENOXAPARIN SODIUM 150 MG/ML ~~LOC~~ SOLN: 150.0000 mg | SUBCUTANEOUS | 0 refills | Status: DC

## 2020-12-16 NOTE — Telephone Encounter (Signed)
Hi Dr. Charlett Blake,  I manage patient's INR.  Patient's will be having a colonoscopy on 5/12.  He will need to hold warfarin for 5 days and bridged with Lovenox. Lovenox has been dosed at 150 mg/kg once daily.  Do I have your permission to send order to pharmacy?  Please advise.  Thanks! Villa Herb, RN

## 2020-12-16 NOTE — Telephone Encounter (Signed)
Yes Thanks for letting me know and please send in the Lovenox dose.

## 2020-12-16 NOTE — Telephone Encounter (Signed)
Noted.  See telephone note dated 12/16/20.

## 2020-12-16 NOTE — Telephone Encounter (Signed)
Instructions for warfarin and Lovenox pre and post procedure on 5/12.  5/7 -  Last dose of warfarin until after procedure. 5/8 - Nothing (No warfarin and no Lovenox) 5/9 - Lovenox in the AM 5/10 - Lovenox in the AM 5/11 - Lovenox in the AM (please take by 7 am) 5/12 - Procedure (Do not take Lovenox today) 5/13 - Lovenox in the AM AND take 2 tablets of warfarin 5/14 - Lovenox in the AM AND take 2 tablets of warfarin 5/15 - Lovenox in the AM AND take 2 tablets of warfarin 5/16 - Lovenox in the AM AND take 1 1/2 tablets of warfarin 5/17 - Check INR

## 2020-12-30 ENCOUNTER — Telehealth: Payer: Self-pay

## 2020-12-30 NOTE — Telephone Encounter (Signed)
Patient called to advise he received the message.

## 2020-12-30 NOTE — Telephone Encounter (Signed)
I have left messages on both cell and home in regards to holding his warfarin 5 days and doing the Lovenox bridge. I told him to call me and confirm he has heard from cardiology.

## 2021-01-19 ENCOUNTER — Ambulatory Visit: Payer: Managed Care, Other (non HMO) | Admitting: General Practice

## 2021-01-19 ENCOUNTER — Other Ambulatory Visit: Payer: Self-pay

## 2021-01-19 DIAGNOSIS — I4891 Unspecified atrial fibrillation: Secondary | ICD-10-CM | POA: Diagnosis not present

## 2021-01-19 LAB — POCT INR: INR: 3 (ref 2.0–3.0)

## 2021-01-19 NOTE — Progress Notes (Signed)
Medical screening examination/treatment/procedure(s) were performed by non-physician practitioner and as supervising physician I was immediately available for consultation/collaboration. I agree with above. Syrianna Schillaci, MD   

## 2021-01-19 NOTE — Patient Instructions (Addendum)
Pre visit review using our clinic review tool, if applicable. No additional management support is needed unless otherwise documented below in the visit note.  Continue to take 1 tablet daily except 1 1/2 tablets on Mon/Wed/Fridays.  Please follow patient instructions for colonoscopy on 5/12.  Re-check on Tuesday 5/17.  Instructions for warfarin and Lovenox pre and post procedure on 5/12.  5/7 -  Last dose of warfarin until after procedure. 5/8 - Nothing (No warfarin and no Lovenox) 5/9 - Lovenox in the AM 5/10 - Lovenox in the AM 5/11 - Lovenox in the AM (please take by 7 am) 5/12 - Procedure (Do not take Lovenox today) 5/13 - Lovenox in the AM AND take 2 tablets of warfarin 5/14 - Lovenox in the AM AND take 2 tablets of warfarin 5/15 - Lovenox in the AM AND take 2 tablets of warfarin 5/16 - Lovenox in the AM AND take 1 1/2 tablets of warfarin 5/17 - Check INR

## 2021-01-28 ENCOUNTER — Other Ambulatory Visit: Payer: Self-pay

## 2021-01-28 ENCOUNTER — Ambulatory Visit (AMBULATORY_SURGERY_CENTER): Payer: Managed Care, Other (non HMO) | Admitting: Internal Medicine

## 2021-01-28 ENCOUNTER — Encounter: Payer: Self-pay | Admitting: Internal Medicine

## 2021-01-28 VITALS — BP 119/69 | HR 52 | Temp 96.6°F | Resp 15 | Ht 74.0 in | Wt 222.0 lb

## 2021-01-28 DIAGNOSIS — Z86010 Personal history of colonic polyps: Secondary | ICD-10-CM

## 2021-01-28 MED ORDER — SODIUM CHLORIDE 0.9 % IV SOLN: 500.0000 mL | Freq: Once | INTRAVENOUS | Status: DC

## 2021-01-28 NOTE — Progress Notes (Signed)
Medical history reviewed with no changes noted. VS assessed by [place] 

## 2021-01-28 NOTE — Op Note (Signed)
[facility] Patient Name: Tyler Sims Procedure Date: 01/28/2021 3:16 PM MRN: [id number] Endoscopist: Gatha Mayer , MD Age: 65 Referring MD:  Date of Birth: 1956-09-04 Gender: Male Account #: [id number] Procedure:                Colonoscopy Indications:              Surveillance: Personal history of adenomatous                            polyps on last colonoscopy > 5 years ago Medicines:                Propofol per Anesthesia, Monitored Anesthesia Care Procedure:                Pre-Anesthesia Assessment:                           - Prior to the procedure, a History and Physical                            was performed, and patient medications and                            allergies were reviewed. The patient's tolerance of                            previous anesthesia was also reviewed. The risks                            and benefits of the procedure and the sedation                            options and risks were discussed with the patient.                            All questions were answered, and informed consent                            was obtained. Prior Anticoagulants: The patient                            last took Coumadin (warfarin) 5 days prior to the                            procedure. ASA Grade Assessment: II - A patient                            with mild systemic disease. After reviewing the                            risks and benefits, the patient was deemed in                            satisfactory condition to undergo the procedure.  After obtaining informed consent, the colonoscope                            was passed under direct vision. Throughout the                            procedure, the patient's blood pressure, pulse, and                            oxygen saturations were monitored continuously. The                            Olympus CF-HQ190 [id number]) Colonoscope was                             introduced through the anus and advanced to the the                            cecum, identified by appendiceal orifice and                            ileocecal valve. The colonoscopy was performed                            without difficulty. The patient tolerated the                            procedure well. The quality of the bowel                            preparation was good. The ileocecal valve,                            appendiceal orifice, and rectum were photographed.                            The bowel preparation used was Miralax via split                            dose instruction. Scope In: 3:33:30 PM Scope Out: 3:41:55 PM Scope Withdrawal Time: 0 hours 6 minutes 15 seconds  Total Procedure Duration: 0 hours 8 minutes 25 seconds  Findings:                 The perianal and digital rectal examinations were                            normal. Pertinent negatives include normal prostate                            (size, shape, and consistency).                           Multiple diverticula were found in the sigmoid  colon.                           The exam was otherwise without abnormality on                            direct and retroflexion views. Complications:            No immediate complications. Estimated Blood Loss:     Estimated blood loss: none. Impression:               - Diverticulosis in the sigmoid colon.                           - The examination was otherwise normal on direct                            and retroflexion views.                           - No specimens collected.                           - Personal history of colonic polyps.                           Colonoscopy May 2013: 1 cm and 2 mm adenomas                           06/22/2015 no polyps -                           01/28/2021 no polyps repeat 10 years 2032 Recommendation:           - Patient has a contact number available for                             emergencies. The signs and symptoms of potential                            delayed complications were discussed with the                            patient. Return to normal activities tomorrow.                            Written discharge instructions were provided to the                            patient.                           - Resume previous diet.                           - Continue present medications.                           -  Resume Coumadin (warfarin) at prior dose today.                           - Repeat colonoscopy in 10 years for surveillance. Gatha Mayer, MD 01/28/2021 3:49:55 PM This report has been signed electronically.

## 2021-01-28 NOTE — Patient Instructions (Addendum)
No polyps seen again!  You still have a condition called diverticulosis - common and not usually a problem. Please read the handout provided.  Next routine colonoscopy or other screening test in 10 years - 2032.  I appreciate the opportunity to care for you. Gatha Mayer, MD, [facility]  Diverticulosis handout given to patient.  Resume Warfarin tonight.  YOU HAD AN ENDOSCOPIC PROCEDURE TODAY AT [facility]:   Refer to the procedure report that was given to you for any specific questions about what was found during the examination.  If the procedure report does not answer your questions, please call your gastroenterologist to clarify.  If you requested that your care partner not be given the details of your procedure findings, then the procedure report has been included in a sealed envelope for you to review at your convenience later.  YOU SHOULD EXPECT: Some feelings of bloating in the abdomen. Passage of more gas than usual.  Walking can help get rid of the air that was put into your GI tract during the procedure and reduce the bloating. If you had a lower endoscopy (such as a colonoscopy or flexible sigmoidoscopy) you may notice spotting of blood in your stool or on the toilet paper. If you underwent a bowel prep for your procedure, you may not have a normal bowel movement for a few days.  Please Note:  You might notice some irritation and congestion in your nose or some drainage.  This is from the oxygen used during your procedure.  There is no need for concern and it should clear up in a day or so.  SYMPTOMS TO REPORT IMMEDIATELY:   Following lower endoscopy (colonoscopy or flexible sigmoidoscopy):  Excessive amounts of blood in the stool  Significant tenderness or worsening of abdominal pains  Swelling of the abdomen that is new, acute  Fever of 100F or higher  For urgent or emergent issues, a gastroenterologist can be reached at any hour by calling [phone]. Do  not use MyChart messaging for urgent concerns.    DIET:  We do recommend a small meal at first, but then you may proceed to your regular diet.  Drink plenty of fluids but you should avoid alcoholic beverages for 24 hours.  ACTIVITY:  You should plan to take it easy for the rest of today and you should NOT DRIVE or use heavy machinery until tomorrow (because of the sedation medicines used during the test).    FOLLOW UP: Our staff will call the number listed on your records 48-72 hours following your procedure to check on you and address any questions or concerns that you may have regarding the information given to you following your procedure. If we do not reach you, we will leave a message.  We will attempt to reach you two times.  During this call, we will ask if you have developed any symptoms of COVID 19. If you develop any symptoms (ie: fever, flu-like symptoms, shortness of breath, cough etc.) before then, please call [phone].  If you test positive for Covid 19 in the 2 weeks post procedure, please call and report this information to [place].    If any biopsies were taken you will be contacted by phone or by letter within the next 1-3 weeks.  Please call us at [phone] if you have not heard about the biopsies in 3 weeks.    SIGNATURES/CONFIDENTIALITY: You and/or your care partner have signed paperwork which will be entered into  your electronic medical record.  These signatures attest to the fact that that the information above on your After Visit Summary has been reviewed and is understood.  Full responsibility of the confidentiality of this discharge information lies with you and/or your care-partner.

## 2021-01-28 NOTE — Progress Notes (Signed)
Report to PACU, RN, vss, BBS= Clear.  

## 2021-02-01 ENCOUNTER — Telehealth: Payer: Self-pay

## 2021-02-01 NOTE — Telephone Encounter (Signed)
LVM

## 2021-02-02 ENCOUNTER — Ambulatory Visit: Payer: Managed Care, Other (non HMO) | Admitting: General Practice

## 2021-02-02 ENCOUNTER — Other Ambulatory Visit: Payer: Self-pay

## 2021-02-02 DIAGNOSIS — I4891 Unspecified atrial fibrillation: Secondary | ICD-10-CM

## 2021-02-02 LAB — POCT INR: INR: 2 (ref 2.0–3.0)

## 2021-02-02 NOTE — Patient Instructions (Addendum)
Pre visit review using our clinic review tool, if applicable. No additional management support is needed unless otherwise documented below in the visit note.  Continue to take 1 tablet daily except 1 1/2 tablets on Mon/Wed/Fridays.  Re-check in 6 weeks. 

## 2021-02-18 ENCOUNTER — Other Ambulatory Visit: Payer: Self-pay | Admitting: Family Medicine

## 2021-03-16 ENCOUNTER — Ambulatory Visit: Payer: Managed Care, Other (non HMO) | Admitting: General Practice

## 2021-03-16 ENCOUNTER — Other Ambulatory Visit: Payer: Self-pay

## 2021-03-16 DIAGNOSIS — I4891 Unspecified atrial fibrillation: Secondary | ICD-10-CM

## 2021-03-16 LAB — POCT INR: INR: 2.5 (ref 2.0–3.0)

## 2021-03-16 NOTE — Progress Notes (Signed)
Medical screening examination/treatment/procedure(s) were performed by non-physician practitioner and as supervising physician I was immediately available for consultation/collaboration. I agree with above. Isais Klipfel, MD   

## 2021-03-16 NOTE — Patient Instructions (Addendum)
Pre visit review using our clinic review tool, if applicable. No additional management support is needed unless otherwise documented below in the visit note.  Continue to take 1 tablet daily except 1 1/2 tablets on Mon/Wed/Fridays.  Re-check in 6 weeks. 

## 2021-04-20 ENCOUNTER — Other Ambulatory Visit: Payer: Self-pay | Admitting: Family Medicine

## 2021-04-20 DIAGNOSIS — G629 Polyneuropathy, unspecified: Secondary | ICD-10-CM

## 2021-04-27 ENCOUNTER — Ambulatory Visit: Payer: Managed Care, Other (non HMO) | Admitting: General Practice

## 2021-04-27 ENCOUNTER — Other Ambulatory Visit: Payer: Self-pay

## 2021-04-27 DIAGNOSIS — I4891 Unspecified atrial fibrillation: Secondary | ICD-10-CM | POA: Diagnosis not present

## 2021-04-27 LAB — POCT INR: INR: 2.4 (ref 2.0–3.0)

## 2021-04-27 NOTE — Progress Notes (Signed)
Medical screening examination/treatment/procedure(s) were performed by non-physician practitioner and as supervising physician I was immediately available for consultation/collaboration. I agree with above. Jasiah Buntin, MD   

## 2021-04-27 NOTE — Patient Instructions (Signed)
Pre visit review using our clinic review tool, if applicable. No additional management support is needed unless otherwise documented below in the visit note.  Continue to take 1 tablet daily except 1 1/2 tablets on Mon/Wed/Fridays.  Re-check in 6 weeks. 

## 2021-05-09 ENCOUNTER — Other Ambulatory Visit: Payer: Self-pay | Admitting: Family Medicine

## 2021-05-10 ENCOUNTER — Encounter: Payer: Self-pay | Admitting: Family Medicine

## 2021-05-10 ENCOUNTER — Ambulatory Visit (INDEPENDENT_AMBULATORY_CARE_PROVIDER_SITE_OTHER): Payer: Managed Care, Other (non HMO) | Admitting: Family Medicine

## 2021-05-10 ENCOUNTER — Other Ambulatory Visit: Payer: Self-pay

## 2021-05-10 VITALS — BP 136/74 | HR 67 | Temp 98.3°F | Resp 16 | Ht 74.0 in | Wt 219.2 lb

## 2021-05-10 DIAGNOSIS — E559 Vitamin D deficiency, unspecified: Secondary | ICD-10-CM | POA: Diagnosis not present

## 2021-05-10 DIAGNOSIS — R700 Elevated erythrocyte sedimentation rate: Secondary | ICD-10-CM | POA: Diagnosis not present

## 2021-05-10 DIAGNOSIS — I10 Essential (primary) hypertension: Secondary | ICD-10-CM

## 2021-05-10 DIAGNOSIS — I4891 Unspecified atrial fibrillation: Secondary | ICD-10-CM

## 2021-05-10 DIAGNOSIS — G6289 Other specified polyneuropathies: Secondary | ICD-10-CM | POA: Diagnosis not present

## 2021-05-10 DIAGNOSIS — G629 Polyneuropathy, unspecified: Secondary | ICD-10-CM

## 2021-05-10 DIAGNOSIS — R351 Nocturia: Secondary | ICD-10-CM

## 2021-05-10 DIAGNOSIS — Z0000 Encounter for general adult medical examination without abnormal findings: Secondary | ICD-10-CM

## 2021-05-10 DIAGNOSIS — E782 Mixed hyperlipidemia: Secondary | ICD-10-CM | POA: Diagnosis not present

## 2021-05-10 LAB — COMPREHENSIVE METABOLIC PANEL
ALT: 17 U/L (ref 0–53)
AST: 21 U/L (ref 0–37)
Albumin: 4.3 g/dL (ref 3.5–5.2)
Alkaline Phosphatase: 83 U/L (ref 39–117)
BUN: 22 mg/dL (ref 6–23)
CO2: 27 mEq/L (ref 19–32)
Calcium: 9.8 mg/dL (ref 8.4–10.5)
Chloride: 103 mEq/L (ref 96–112)
Creatinine, Ser: 1.08 mg/dL (ref 0.40–1.50)
GFR: 72.92 mL/min (ref >60.00)
Glucose, Bld: 86 mg/dL (ref 70–99)
Potassium: 4.5 mEq/L (ref 3.5–5.1)
Sodium: 138 mEq/L (ref 135–145)
Total Bilirubin: 1 mg/dL (ref 0.2–1.2)
Total Protein: 7.8 g/dL (ref 6.0–8.3)

## 2021-05-10 LAB — PSA: PSA: 1.24 ng/mL (ref 0.10–4.00)

## 2021-05-10 LAB — LIPID PANEL
Cholesterol: 135 mg/dL (ref 0–200)
HDL: 53.3 mg/dL (ref >39.00)
LDL Cholesterol: 64 mg/dL (ref 0–99)
NonHDL: 81.98
Total CHOL/HDL Ratio: 3
Triglycerides: 90 mg/dL (ref 0.0–149.0)
VLDL: 18 mg/dL (ref 0.0–40.0)

## 2021-05-10 LAB — CBC
HCT: 38 % — ABNORMAL LOW (ref 39.0–52.0)
Hemoglobin: 13.1 g/dL (ref 13.0–17.0)
MCHC: 34.4 g/dL (ref 30.0–36.0)
MCV: 94.1 fl (ref 78.0–100.0)
Platelets: 207 10*3/uL (ref 150.0–400.0)
RBC: 4.03 Mil/uL — ABNORMAL LOW (ref 4.22–5.81)
RDW: 13.7 % (ref 11.5–15.5)
WBC: 4 10*3/uL (ref 4.0–10.5)

## 2021-05-10 LAB — SEDIMENTATION RATE: Sed Rate: 36 mm/hr — ABNORMAL HIGH (ref 0–20)

## 2021-05-10 LAB — TSH: TSH: 2.04 u[IU]/mL (ref 0.35–5.50)

## 2021-05-10 LAB — VITAMIN B12: Vitamin B-12: 307 pg/mL (ref 211–911)

## 2021-05-10 NOTE — Assessment & Plan Note (Signed)
Patient encouraged to maintain heart healthy diet, regular exercise, adequate sleep. Consider daily probiotics. Take medications as prescribed. Labs ordered and reviewed 

## 2021-05-10 NOTE — Assessment & Plan Note (Signed)
Continue to monitor

## 2021-05-10 NOTE — Patient Instructions (Signed)
Paxlovid is the new COVID medication we can give you if you get COVID so make sure you test if you have symptoms because we have to treat by day 5 of symptoms for it to be effective. If you are positive let us know so we can treat. If a home test is negative and your symptoms are persistent get a PCR test. Can check testing locations at [email or web address] If you are positive we will make an appointment with [place] and we will send in Paxlovid if you would like it. Check with your pharmacy before we meet to confirm they have it in stock, if they do not then we can get the prescription at the [facility]   Preventive Care 64-12 Years Old, Male Preventive care refers to lifestyle choices and visits with your health care provider that can promote health and wellness. This includes: A yearly physical exam. This is also called an annual wellness visit. Regular dental and eye exams. Immunizations. Screening for certain conditions. Healthy lifestyle choices, such as: Eating a healthy diet. Getting regular exercise. Not using drugs or products that contain nicotine and tobacco. Limiting alcohol use. What can I expect for my preventive care visit? Physical exam Your health care provider will check your: Height and weight. These may be used to calculate your BMI (body mass index). BMI is a measurement that tells if you are at a healthy weight. Heart rate and blood pressure. Body temperature. Skin for abnormal spots. Counseling Your health care provider may ask you questions about your: Past medical problems. Family's medical history. Alcohol, tobacco, and drug use. Emotional well-being. Home life and relationship well-being. Sexual activity. Diet, exercise, and sleep habits. Work and work Statistician. Access to firearms. What immunizations do I need?  Vaccines are usually given at various ages, according to a schedule. Your health care provider will recommend vaccines for you based  on your age, medicalhistory, and lifestyle or other factors, such as travel or where you work. What tests do I need? Blood tests Lipid and cholesterol levels. These may be checked every 5 years, or more often if you are over 31 years old. Hepatitis C test. Hepatitis B test. Screening Lung cancer screening. You may have this screening every year starting at age 38 if you have a 30-pack-year history of smoking and currently smoke or have quit within the past 15 years. Prostate cancer screening. Recommendations will vary depending on your family history and other risks. Genital exam to check for testicular cancer or hernias. Colorectal cancer screening. All adults should have this screening starting at age 53 and continuing until age 80. Your health care provider may recommend screening at age 72 if you are at increased risk. You will have tests every 1-10 years, depending on your results and the type of screening test. Diabetes screening. This is done by checking your blood sugar (glucose) after you have not eaten for a while (fasting). You may have this done every 1-3 years. STD (sexually transmitted disease) testing, if you are at risk. Follow these instructions at home: Eating and drinking  Eat a diet that includes fresh fruits and vegetables, whole grains, lean protein, and low-fat dairy products. Take vitamin and mineral supplements as recommended by your health care provider. Do not drink alcohol if your health care provider tells you not to drink. If you drink alcohol: Limit how much you have to 0-2 drinks a day. Be aware of how much alcohol is in your drink. In the [place]  one drink equals one 12 oz bottle of beer (355 mL), one 5 oz glass of wine (148 mL), or one 1 oz glass of hard liquor (44 mL).  Lifestyle Take daily care of your teeth and gums. Brush your teeth every morning and night with fluoride toothpaste. Floss one time each day. Stay active. Exercise for at least 30  minutes 5 or more days each week. Do not use any products that contain nicotine or tobacco, such as cigarettes, e-cigarettes, and chewing tobacco. If you need help quitting, ask your health care provider. Do not use drugs. If you are sexually active, practice safe sex. Use a condom or other form of protection to prevent STIs (sexually transmitted infections). If told by your health care provider, take low-dose aspirin daily starting at age 73. Find healthy ways to cope with stress, such as: Meditation, yoga, or listening to music. Journaling. Talking to a trusted person. Spending time with friends and family. Safety Always wear your seat belt while driving or riding in a vehicle. Do not drive: If you have been drinking alcohol. Do not ride with someone who has been drinking. When you are tired or distracted. While texting. Wear a helmet and other protective equipment during sports activities. If you have firearms in your house, make sure you follow all gun safety procedures. What's next? Go to your health care provider once a year for an annual wellness visit. Ask your health care provider how often you should have your eyes and teeth checked. Stay up to date on all vaccines. This information is not intended to replace advice given to you by your health care provider. Make sure you discuss any questions you have with your healthcare provider. Document Revised: 06/04/2019 Document Reviewed: 08/30/2018 Elsevier Patient Education  2022 [employer].

## 2021-05-10 NOTE — Progress Notes (Signed)
Subjective:   By signing my name below, I, Zite Okoli, attest that this documentation has been prepared under the direction and in the presence of Penni Homans, MD. 05/10/2021    Patient ID: Tyler Sims, male    DOB: Feb 11, 1956, 65 y.o.   MRN: [id number]  Chief Complaint  Patient presents with   Annual Exam    HPI Patient is in today for a comprehensive physical exam.  He mentions he cannot travel for long distances in the car anymore because of his neuropathy. His legs start to cramp and he feels a burning sensation in his legs. He feels the sensation when he walks and sometimes feels like he is about to lose his balance. He tolerates the pain and it has not worsened.  He now works part-time at [employer] about three times a week.  He reports that he sleeps about 6 hours every night and is always well-rested when he wakes up.  He has been managing his heart with 5 mg warfarin and is doing well on it.  He denies fever, congestion, eye pain, chest pain, palpitations, leg swelling, shortness of breath, nausea, abdominal pain, diarrhea and blood in stool. Also denies dysuria, frequency, back pain and headaches.  He does not regularly exercise because of the excessive walking at his job. He is managing a healthy diet.  He has 4 Pfizer Covid-19 vaccines at this time.  There has been no recent changes to his family medical history. He has had no recent surgeries.  Past Medical History:  Diagnosis Date   Abnormal transaminases    Acid reflux    Allergy    Bradycardia    nocturnal   Colon polyps 02/07/2012   Dr. Silvano Rusk   Elevated sed rate 07/06/2017   HTN (hypertension) 06/02/2014   Low back pain 06/15/2015   Measles ([place])    Mitral regurgitation    Mumps    Neuropathy 01/10/2016   NICM (nonischemic cardiomyopathy) ([place])    Persistent atrial fibrillation ([place]) 08/24/2010   Preventative health care 12/04/2013   Colonsocopy May '13 - adenomatous polyps   Immunizations Tdap Jan '14    Stroke [facility]) [id number]   Systolic CHF ([place])    Urinary hesitancy 12/21/2014   Varicella    Ventricular tachycardia, non-sustained ([place])     Past Surgical History:  Procedure Laterality Date   CARDIAC CATHETERIZATION     CARDIOVERSION  08/15/2012   Procedure: CARDIOVERSION;  Surgeon: Deboraha Sprang, MD;  Location: [place];  Service: Cardiovascular;  Laterality: N/A;   CARDIOVERSION N/A 05/28/2018   Procedure: CARDIOVERSION;  Surgeon: Fay Records, MD;  Location[facility] ENDOSCOPY;  Service: Cardiovascular;  Laterality: N/A;   COLONOSCOPY W/ BIOPSIES  02/07/2012   Dr. Silvano Rusk    Family History  Problem Relation Age of Onset   Alzheimer's disease Mother    Heart disease Father        CAD/MI   Hypertension Father    Mental illness Father        h/o depression requiring hospitalization   Hypertension Sister    Neuropathy Sister    Arthritis Sister    Hyperlipidemia Sister        x 2   Hypertension Sister    Arthritis Sister    Obesity Sister    Lung cancer Brother        x 2, smokers   Hypertension Brother    Heart disease Brother    Lung cancer Brother  Hypertension Brother    Colon polyps Brother    Hypertension Brother    Intellectual disability Brother    Varicose Veins Daughter    Birth defects Paternal Aunt    Colon cancer Neg Hx    Rectal cancer Neg Hx    Stomach cancer Neg Hx     Social History   Socioeconomic History   Marital status: Married    Spouse name: Charlett Nose   Number of children: 2   Years of education: 16   Highest education level: Not on file  Occupational History   Occupation: Health visitor: [employer]  Tobacco Use   Smoking status: Never   Smokeless tobacco: Never  Vaping Use   Vaping Use: Never used  Substance and Sexual Activity   Alcohol use: Yes    Alcohol/week: 0.0 standard drinks    Comment: occasional wine   Drug use: No   Sexual activity: Yes    Partners: Female     Comment: lives with wife, no dietary restrictions, except avoid dairy, uses Lact[employer], works with [employer]  Other Topics Concern   Not on file  Social History Narrative   Patient is married with 2 children. 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   Patient has 16 years of education.   Patient drinks 1-2 cups caffeine daily.   Never smoker occasional wine no drug use   Social Determinants of Radio broadcast assistant Strain: Not on file  Food Insecurity: Not on file  Transportation Needs: Not on file  Physical Activity: Not on file  Stress: Not on file  Social Connections: Not on file  Intimate Partner Violence: Not on file    Outpatient Medications Prior to Visit  Medication Sig Dispense Refill   atorvastatin (LIPITOR) 20 MG tablet Take 1 tablet (20 mg total) by mouth daily. 90 tablet 3   cholecalciferol (VITAMIN D) 1000 units tablet Take 1 tablet (1,000 Units total) by mouth daily. (Patient taking differently: Take 1,000 Units by mouth every other day.)     COD LIVER OIL PO Take 1 capsule by mouth daily.      Coenzyme Q10 (CO Q-10) 200 MG CAPS Take 200 mg by mouth daily.      diphenhydramine-acetaminophen (TYLENOL PM) 25-500 MG TABS tablet Take 2 tablets by mouth at bedtime as needed (pain / sleep).     folic acid (FOLVITE) 1 MG tablet Take 1 tablet (1 mg total) by mouth daily. 90 tablet 3   gabapentin (NEURONTIN) 800 MG tablet TAKE 1 TABLET BY MOUTH THREE TIMES A DAY 270 tablet 1   losartan (COZAAR) 50 MG tablet Take 1 tablet (50 mg total) by mouth daily. 90 tablet 3   metoprolol succinate (TOPROL-XL) 25 MG 24 hr tablet TAKE 1 TABLET DAILY 90 tablet 1   spironolactone (ALDACTONE) 25 MG tablet TAKE 1 TABLET DAILY 30 tablet 11   thiamine (VITAMIN B-1) 100 MG tablet Take 1 tablet (100 mg total) by mouth daily. 30  tablet 5   warfarin (COUMADIN) 5 MG tablet Take 1 tablet daily except take 1 1/2 tablets on Monday Wednesday and Fridays or Take as directed by anticoagulation clinic 120 tablet 3   enoxaparin (LOVENOX) 150 MG/ML injection Inject 1 mL (150 mg  total) into the skin daily for 7 doses. 7 mL 0   0.9 %  sodium chloride infusion      No facility-administered medications prior to visit.    Allergies  Allergen Reactions   Lisinopril Rash   Zocor [Simvastatin] Rash    Review of Systems  Constitutional:  Negative for fever.  HENT:  Negative for congestion.   Eyes:  Negative for pain.  Respiratory:  Negative for shortness of breath.   Cardiovascular:  Negative for chest pain, palpitations and leg swelling.  Gastrointestinal:  Negative for abdominal pain, blood in stool, diarrhea and nausea.  Genitourinary:  Negative for dysuria and frequency.  Musculoskeletal:  Negative for back pain.  Neurological:  Negative for headaches.       (+) neuropathy      Objective:    Physical Exam Constitutional:      Appearance: Normal appearance. He is not ill-appearing.  HENT:     Head: Normocephalic and atraumatic.     Right Ear: Tympanic membrane, ear canal and external ear normal.     Left Ear: Tympanic membrane, ear canal and external ear normal.     Ears:     Comments: Cerumen in both ears Eyes:     Conjunctiva/sclera: Conjunctivae normal.     Comments: No nystagmus  Cardiovascular:     Rate and Rhythm: Normal rate and regular rhythm.     Heart sounds: Normal heart sounds. No murmur heard. Pulmonary:     Breath sounds: Normal breath sounds. No wheezing.  Abdominal:     General: Bowel sounds are normal. There is no distension.     Palpations: Abdomen is soft.     Tenderness: There is no abdominal tenderness.     Hernia: No hernia is present.  Musculoskeletal:     Cervical back: Neck supple.     Comments: 5/5 strength in upper and lower extremities  Lymphadenopathy:     Cervical: No  cervical adenopathy.  Skin:    General: Skin is warm and dry.  Neurological:     Mental Status: He is alert and oriented to person, place, and time.     Deep Tendon Reflexes:     Reflex Scores:      Patellar reflexes are 2+ on the right side and 2+ on the left side. Psychiatric:        Behavior: Behavior normal.    BP 136/74   Pulse 67   Temp 98.3 F (36.8 C)   Resp 16   Ht '6\' 2"'$  (1.88 m)   Wt 219 lb 3.2 oz (99.4 kg)   SpO2 99%   BMI 28.14 kg/m  Wt Readings from Last 3 Encounters:  05/10/21 219 lb 3.2 oz (99.4 kg)  01/28/21 222 lb (100.7 kg)  12/08/20 222 lb 8 oz (100.9 kg)    Diabetic Foot Exam - Simple   No data filed    Lab Results  Component Value Date   WBC 5.4 05/04/2020   HGB 14.1 05/04/2020   HCT 41.1 05/04/2020   PLT 206.0 05/04/2020   GLUCOSE 99 08/10/2020   CHOL 147 05/04/2020   TRIG 93.0 05/04/2020   HDL 56.50 05/04/2020   LDLDIRECT 110.4 10/03/2012   LDLCALC 72 05/04/2020   ALT 15 05/04/2020   AST 20 05/04/2020   NA 139 08/10/2020   K 4.6 08/10/2020   CL 104 08/10/2020   CREATININE 1.01 08/10/2020   BUN 18 08/10/2020   CO2 24 08/10/2020   TSH 1.89 10/17/2019  PSA 1.09 05/04/2020   INR 2.4 04/27/2021   HGBA1C  08/28/2010    4.7 (NOTE)                                                                       According to the ADA Clinical Practice Recommendations for 2011, when HbA1c is used as a screening test:   >=6.5%   Diagnostic of Diabetes Mellitus           (if abnormal result  is confirmed)  5.7-6.4%   Increased risk of developing Diabetes Mellitus  References:Diagnosis and Classification of Diabetes Mellitus,Diabetes S8098542 1):S62-S69 and Standards of Medical Care in         Diabetes - 2011,Diabetes Care,2011,34  (Suppl 1):S11-S61.    Lab Results  Component Value Date   TSH 1.89 10/17/2019   Lab Results  Component Value Date   WBC 5.4 05/04/2020   HGB 14.1 05/04/2020   HCT 41.1 05/04/2020   MCV 94.7 05/04/2020   PLT  206.0 05/04/2020   Lab Results  Component Value Date   NA 139 08/10/2020   K 4.6 08/10/2020   CO2 24 08/10/2020   GLUCOSE 99 08/10/2020   BUN 18 08/10/2020   CREATININE 1.01 08/10/2020   BILITOT 1.2 05/04/2020   ALKPHOS 75 05/04/2020   AST 20 05/04/2020   ALT 15 05/04/2020   PROT 7.9 05/04/2020   ALBUMIN 4.5 05/04/2020   CALCIUM 9.8 08/10/2020   GFR 83.63 05/04/2020   Lab Results  Component Value Date   CHOL 147 05/04/2020   Lab Results  Component Value Date   HDL 56.50 05/04/2020   Lab Results  Component Value Date   LDLCALC 72 05/04/2020   Lab Results  Component Value Date   TRIG 93.0 05/04/2020   Lab Results  Component Value Date   CHOLHDL 3 05/04/2020   Lab Results  Component Value Date   HGBA1C  08/28/2010    4.7 (NOTE)                                                                       According to the ADA Clinical Practice Recommendations for 2011, when HbA1c is used as a screening test:   >=6.5%   Diagnostic of Diabetes Mellitus           (if abnormal result  is confirmed)  5.7-6.4%   Increased risk of developing Diabetes Mellitus  References:Diagnosis and Classification of Diabetes Mellitus,Diabetes S8098542 1):S62-S69 and Standards of Medical Care in         Diabetes - 2011,Diabetes Care,2011,34  (Suppl 1):S11-S61.        Colonoscopy: Last completed on 01/28/2021. Results showed diverticulosis in the sigmoid colon otherwise exam was normal. Patient has a history of colonic polyps in 2013. Repeat in 10 years. Dexa: Not completed  PSA:  Assessment & Plan:   Problem List Items Addressed This Visit     Hyperlipidemia    Encourage heart healthy diet such as MIND or  DASH diet, increase exercise, avoid trans fats, simple carbohydrates and processed foods, consider a krill or fish or flaxseed oil cap daily. Atorvastatin is well tolerated      Relevant Orders   Lipid panel   Atrial fibrillation (HCC)    Tolerating Coumadin, rate  controlled      Preventative health care    Patient encouraged to maintain heart healthy diet, regular exercise, adequate sleep. Consider daily probiotics. Take medications as prescribed. Labs ordered and reviewed      Relevant Orders   PSA   HTN (hypertension)    Well controlled, no changes to meds. Encouraged heart healthy diet such as the DASH diet and exercise as tolerated.       Relevant Orders   Comprehensive metabolic panel   TSH   CBC   Vitamin D deficiency    Supplement and monitor      Relevant Orders   Vitamin D 1,25 dihydroxy   Neuropathy    Manages with decreasing his work to part time. Not worsening. Was previously seen by neurology. Check Vitamin 1, 3, 6, 12 today      Elevated sed rate    Continue to monitor      Relevant Orders   Sedimentation rate   Other Visit Diagnoses     Nocturia    -  Primary   Relevant Orders   PSA   Other polyneuropathy       Relevant Orders   Vitamin B12   Vitamin B1   Vitamin B3   Vitamin B6        No orders of the defined types were placed in this encounter.   I,Zite Okoli,acting as a Education administrator for Penni Homans, MD.,have documented all relevant documentation on the behalf of Penni Homans, MD,as directed by  Penni Homans, MD while in the presence of Penni Homans, MD.   I, Penni Homans, MD. , personally preformed the services described in this documentation.  All medical record entries made by the scribe were at my direction and in my presence.  I have reviewed the chart and discharge instructions (if applicable) and agree that the record reflects my personal performance and is accurate and complete. 05/10/2021     Penni Homans, MD

## 2021-05-10 NOTE — Assessment & Plan Note (Addendum)
Manages with decreasing his work to part time. Not worsening. Was previously seen by neurology. Check Vitamin 1, 3, 6, 12 today

## 2021-05-10 NOTE — Assessment & Plan Note (Signed)
Supplement and monitor 

## 2021-05-10 NOTE — Assessment & Plan Note (Signed)
Well controlled, no changes to meds. Encouraged heart healthy diet such as the DASH diet and exercise as tolerated.  °

## 2021-05-10 NOTE — Assessment & Plan Note (Addendum)
Encourage heart healthy diet such as MIND or DASH diet, increase exercise, avoid trans fats, simple carbohydrates and processed foods, consider a krill or fish or flaxseed oil cap daily. Atorvastatin is well tolerated

## 2021-05-10 NOTE — Assessment & Plan Note (Signed)
Tolerating Coumadin, rate controlled 

## 2021-05-11 ENCOUNTER — Encounter: Payer: Self-pay | Admitting: Family Medicine

## 2021-05-14 ENCOUNTER — Other Ambulatory Visit: Payer: Self-pay

## 2021-05-14 DIAGNOSIS — R748 Abnormal levels of other serum enzymes: Secondary | ICD-10-CM

## 2021-05-14 LAB — VITAMIN B1: Vitamin B1 (Thiamine): 111 nmol/L — ABNORMAL HIGH (ref 8–30)

## 2021-05-14 LAB — VITAMIN B6: Vitamin B6: 5.2 ng/mL (ref 2.1–21.7)

## 2021-05-16 LAB — VITAMIN D 1,25 DIHYDROXY
Vitamin D 1, 25 (OH)2 Total: 29 pg/mL (ref 18–72)
Vitamin D2 1, 25 (OH)2: <8 pg/mL
Vitamin D3 1, 25 (OH)2: 29 pg/mL

## 2021-05-16 LAB — VITAMIN B3
Nicotinamide: 65 ng/mL
Nicotinic Acid: <20 ng/mL

## 2021-06-08 ENCOUNTER — Ambulatory Visit (INDEPENDENT_AMBULATORY_CARE_PROVIDER_SITE_OTHER): Payer: Managed Care, Other (non HMO)

## 2021-06-08 ENCOUNTER — Other Ambulatory Visit: Payer: Self-pay

## 2021-06-08 DIAGNOSIS — Z7901 Long term (current) use of anticoagulants: Secondary | ICD-10-CM | POA: Diagnosis not present

## 2021-06-08 LAB — POCT INR: INR: 2.3 (ref 2.0–3.0)

## 2021-06-08 NOTE — Patient Instructions (Addendum)
Pre visit review using our clinic review tool, if applicable. No additional management support is needed unless otherwise documented below in the visit note.  Continue to take 1 tablet daily except 1 1/2 tablets on Mon/Wed/Fridays.  Re-check in 6 weeks. 

## 2021-07-20 ENCOUNTER — Ambulatory Visit (INDEPENDENT_AMBULATORY_CARE_PROVIDER_SITE_OTHER): Payer: Managed Care, Other (non HMO)

## 2021-07-20 ENCOUNTER — Other Ambulatory Visit: Payer: Self-pay

## 2021-07-20 DIAGNOSIS — Z7901 Long term (current) use of anticoagulants: Secondary | ICD-10-CM

## 2021-07-20 LAB — POCT INR: INR: 3 (ref 2.0–3.0)

## 2021-07-20 NOTE — Progress Notes (Signed)
Medical treatment/procedure(s) were performed by non-physician practitioner and as supervising physician I was immediately available for consultation/collaboration. I agree with above. Urbano Milhouse A Fordyce Lepak, MD  

## 2021-07-20 NOTE — Patient Instructions (Addendum)
Pre visit review using our clinic review tool, if applicable. No additional management support is needed unless otherwise documented below in the visit note.  Continue to take 1 tablet daily except 1 1/2 tablets on Mon/Wed/Fridays.  Re-check in 6 weeks. 

## 2021-07-20 NOTE — Progress Notes (Signed)
Continue to take 1 tablet daily except 1 1/2 tablets on Mon/Wed/Fridays.  Re-check in 6 weeks. 

## 2021-07-28 ENCOUNTER — Other Ambulatory Visit: Payer: Self-pay | Admitting: Student

## 2021-08-16 ENCOUNTER — Other Ambulatory Visit: Payer: Self-pay

## 2021-08-16 ENCOUNTER — Other Ambulatory Visit (INDEPENDENT_AMBULATORY_CARE_PROVIDER_SITE_OTHER): Payer: Managed Care, Other (non HMO)

## 2021-08-16 DIAGNOSIS — R748 Abnormal levels of other serum enzymes: Secondary | ICD-10-CM

## 2021-08-17 ENCOUNTER — Encounter: Payer: Self-pay | Admitting: Family Medicine

## 2021-08-17 ENCOUNTER — Other Ambulatory Visit: Payer: Self-pay | Admitting: Family Medicine

## 2021-08-19 ENCOUNTER — Encounter: Payer: Self-pay | Admitting: Internal Medicine

## 2021-08-19 ENCOUNTER — Ambulatory Visit: Payer: Managed Care, Other (non HMO) | Admitting: Internal Medicine

## 2021-08-19 ENCOUNTER — Other Ambulatory Visit: Payer: Self-pay

## 2021-08-19 ENCOUNTER — Encounter: Payer: Self-pay | Admitting: Family Medicine

## 2021-08-19 VITALS — BP 124/84 | HR 63 | Ht 74.0 in | Wt 222.4 lb

## 2021-08-19 DIAGNOSIS — I480 Paroxysmal atrial fibrillation: Secondary | ICD-10-CM | POA: Diagnosis not present

## 2021-08-19 NOTE — Progress Notes (Signed)
Patient Care Team: Mosie Lukes, MD as PCP - General (Family Medicine) Deboraha Sprang, MD as PCP - Electrophysiology (Cardiology) Deboraha Sprang, MD as PCP - Cardiology (Cardiology) Gatha Mayer, MD as Consulting Physician (Gastroenterology) Garvin Fila, MD as Consulting Physician (Neurology)   HPI  Tyler Sims is a 65 y.o. male Seen in followup for nonischemic cardiomyopathy with which he presented with a stroke and atrial fibrillation.  It was initially hoped that this was secondary to the rate. However, despite restoration of sinus rhythm, he has had persistent left ventricular dysfunction.   He is on Coumadin anticoagulation;  no bleeding  He has been doing well. Since his birthday, he has been working 30 hours per week. He is able to perform mild activities like walking without cardiovascular concerns. However, neuropathy causes him generalized aches and pain. The patient denies chest pain, shortness of breath, nocturnal dyspnea, orthopnea or peripheral edema.  There have been no palpitations, lightheadedness or syncope.     Desires ED meds  DATE TEST EF   1/1 LHC  CAs normal  1/12 Echo 20-25 %   1/14 Echo 40-45 %   8/17 Echo 45-50 %   12/21 Echo 50-55 %    Date Cr K Hgb  11/21 1.01 4.6 14.1 (8/21)  8/22 1.08 4.5 13.1       Past Medical History:  Diagnosis Date   Abnormal transaminases    Acid reflux    Allergy    Bradycardia    nocturnal   Colon polyps 02/07/2012   Dr. Silvano Rusk   Elevated sed rate 07/06/2017   HTN (hypertension) 06/02/2014   Low back pain 06/15/2015   Measles ([place])    Mitral regurgitation    Mumps    Neuropathy 01/10/2016   NICM (nonischemic cardiomyopathy) ([place])    Persistent atrial fibrillation ([place]) 08/24/2010   Preventative health care 12/04/2013   Colonsocopy May '13 - adenomatous polyps  Immunizations Tdap Jan '14    Stroke [facility]) 16/0/7371   Systolic CHF ([place])    Urinary hesitancy 12/21/2014   Varicella    Ventricular  tachycardia, non-sustained     Past Surgical History:  Procedure Laterality Date   CARDIAC CATHETERIZATION     CARDIOVERSION  08/15/2012   Procedure: CARDIOVERSION;  Surgeon: Deboraha Sprang, MD;  Location: [place];  Service: Cardiovascular;  Laterality: N/A;   CARDIOVERSION N/A 05/28/2018   Procedure: CARDIOVERSION;  Surgeon: Fay Records, MD;  Location: [place];  Service: Cardiovascular;  Laterality: N/A;   COLONOSCOPY W/ BIOPSIES  02/07/2012   Dr. Silvano Rusk    Current Outpatient Medications  Medication Sig Dispense Refill   atorvastatin (LIPITOR) 20 MG tablet Take 1 tablet (20 mg total) by mouth daily. 90 tablet 3   cholecalciferol (VITAMIN D) 1000 units tablet Take 1 tablet (1,000 Units total) by mouth daily. (Patient taking differently: Take 1,000 Units by mouth every other day.)     COD LIVER OIL PO Take 1 capsule by mouth daily.      Coenzyme Q10 (CO Q-10) 200 MG CAPS Take 200 mg by mouth daily.      diphenhydramine-acetaminophen (TYLENOL PM) 25-500 MG TABS tablet Take 2 tablets by mouth at bedtime as needed (pain / sleep).     folic acid (FOLVITE) 1 MG tablet TAKE 1 TABLET BY MOUTH EVERY DAY 90 tablet 1   gabapentin (NEURONTIN) 800 MG tablet TAKE 1 TABLET BY MOUTH THREE TIMES A DAY 270 tablet 1  losartan (COZAAR) 50 MG tablet Take 1 tablet (50 mg total) by mouth daily. Please keep upcoming appt in Dr. Caryl Comes in December 2022 before anymore refills. Thank you 90 tablet 0   metoprolol succinate (TOPROL-XL) 25 MG 24 hr tablet TAKE 1 TABLET DAILY 90 tablet 3   spironolactone (ALDACTONE) 25 MG tablet TAKE 1 TABLET DAILY 30 tablet 11   thiamine (VITAMIN B-1) 100 MG tablet Take 1 tablet (100 mg total) by mouth daily. 30 tablet 5   warfarin (COUMADIN) 5 MG tablet Take 1 tablet daily except take 1 1/2 tablets on Monday Wednesday and Fridays or Take as directed by anticoagulation clinic 120 tablet 3   No current facility-administered medications for this visit.    Allergies   Allergen Reactions   Lisinopril Rash   Zocor [Simvastatin] Rash    Review of Systems negative except from HPI and PMH  Physical Exam Vitals:   08/19/21 1611  BP: 124/84  Pulse: 63  SpO2: 98%   Well developed and nourished in no acute distress HENT normal Neck supple with JVP-  flat  Clear Regular rate and rhythm, no murmurs or gallops Abd-soft with active BS No Clubbing cyanosis edema Skin-warm and dry A & Oriented  Grossly normal sensory and motor function  ECG sinus @ 63 17/09/42  Assessment and  Plan Persistent  atrial fibrillation  Sinus bradycardia  Hypertension  Nonischemic cardiomyopathy  Blood pressure is well controlled.  We will continue Aldactone to 25 mg, losartan 50 mg and metoprolol 25 mg.  Bumex was normal 8/22 we will need to recheck in a couple of months.  No interval atrial fibrillation which she is aware.  No bleeding issues.  Continue Coumadin.  No contraindications to phosphodiesterase inhibitors for ED       Wilhemina Bonito as a scribe for Virl Axe, MD.,have documented all relevant documentation on the behalf of Virl Axe, MD,as directed by  Virl Axe, MD while in the presence of Virl Axe, MD.  I, Virl Axe, MD, have reviewed all documentation for this visit. The documentation on 08/19/21 for the exam, diagnosis, procedures, and orders are all accurate and complete.

## 2021-08-19 NOTE — Patient Instructions (Signed)

## 2021-08-20 LAB — VITAMIN B1: Vitamin B1 (Thiamine): 28 nmol/L (ref 8–30)

## 2021-08-21 ENCOUNTER — Other Ambulatory Visit: Payer: Self-pay | Admitting: Family Medicine

## 2021-08-21 MED ORDER — SILDENAFIL CITRATE 50 MG PO TABS: 25.0000 mg | ORAL_TABLET | Freq: Every day | ORAL | 1 refills | Status: DC | PRN

## 2021-08-31 ENCOUNTER — Other Ambulatory Visit: Payer: Self-pay

## 2021-08-31 ENCOUNTER — Ambulatory Visit (INDEPENDENT_AMBULATORY_CARE_PROVIDER_SITE_OTHER): Payer: Managed Care, Other (non HMO)

## 2021-08-31 DIAGNOSIS — Z7901 Long term (current) use of anticoagulants: Secondary | ICD-10-CM | POA: Diagnosis not present

## 2021-08-31 LAB — POCT INR: INR: 2.8 (ref 2.0–3.0)

## 2021-08-31 NOTE — Patient Instructions (Addendum)
Pre visit review using our clinic review tool, if applicable. No additional management support is needed unless otherwise documented below in the visit note.  Continue to take 1 tablet daily except 1 1/2 tablets on Mon/Wed/Fridays.  Re-check in 6 weeks. 

## 2021-08-31 NOTE — Progress Notes (Signed)
Patient ID: Tyler Sims, male   DOB: 06/29/1956, 65 y.o.   MRN: [id number]  Medical screening examination/treatment/procedure(s) were performed by non-physician practitioner and as supervising physician I was immediately available for consultation/collaboration.  I agree with above. Cathlean Cower, MD

## 2021-08-31 NOTE — Progress Notes (Signed)
Continue to take 1 tablet daily except 1 1/2 tablets on Mon/Wed/Fridays.  Re-check in 6 weeks. 

## 2021-09-14 ENCOUNTER — Other Ambulatory Visit: Payer: Self-pay | Admitting: Internal Medicine

## 2021-10-12 ENCOUNTER — Other Ambulatory Visit: Payer: Self-pay

## 2021-10-12 ENCOUNTER — Ambulatory Visit (INDEPENDENT_AMBULATORY_CARE_PROVIDER_SITE_OTHER): Payer: Managed Care, Other (non HMO)

## 2021-10-12 DIAGNOSIS — Z7901 Long term (current) use of anticoagulants: Secondary | ICD-10-CM

## 2021-10-12 LAB — POCT INR: INR: 3.1 — AB (ref 2.0–3.0)

## 2021-10-12 NOTE — Patient Instructions (Addendum)
Pre visit review using our clinic review tool, if applicable. No additional management support is needed unless otherwise documented below in the visit note.  Reduce dose today to 1/2 tablets and then continue to take 1 tablet daily except 1 1/2 tablets on Mon/Wed/Fridays.  Re-check in 6 weeks.

## 2021-10-12 NOTE — Progress Notes (Signed)
Medical treatment/procedure(s) were performed by non-physician practitioner and as supervising physician I was immediately available for consultation/collaboration. I agree with above. Khady Vandenberg A Lynnmarie Lovett, MD  

## 2021-10-12 NOTE — Progress Notes (Signed)
Reduce dose today to 1/2 tablets and then continue to take 1 tablet daily except 1 1/2 tablets on Mon/Wed/Fridays.  Re-check in 6 weeks.

## 2021-10-16 ENCOUNTER — Other Ambulatory Visit: Payer: Self-pay | Admitting: Family Medicine

## 2021-10-16 DIAGNOSIS — G629 Polyneuropathy, unspecified: Secondary | ICD-10-CM

## 2021-10-24 ENCOUNTER — Other Ambulatory Visit: Payer: Self-pay | Admitting: Internal Medicine

## 2021-11-10 ENCOUNTER — Other Ambulatory Visit: Payer: Self-pay | Admitting: Family Medicine

## 2021-11-23 ENCOUNTER — Ambulatory Visit: Payer: Managed Care, Other (non HMO)

## 2021-11-23 ENCOUNTER — Other Ambulatory Visit: Payer: Self-pay

## 2021-11-23 DIAGNOSIS — Z7901 Long term (current) use of anticoagulants: Secondary | ICD-10-CM

## 2021-11-23 LAB — POCT INR: INR: 2.5 (ref 2.0–3.0)

## 2021-11-23 NOTE — Progress Notes (Signed)
Continue to take 1 tablet daily except 1 1/2 tablets on Mon/Wed/Fridays.  Re-check in 6 weeks. 

## 2021-11-23 NOTE — Progress Notes (Signed)
Patient ID: Tyler Sims, male   DOB: 1955/11/13, 66 y.o.   MRN: [id number] ? ?Medical screening examination/treatment/procedure(s) were performed by non-physician practitioner and as supervising physician I was immediately available for consultation/collaboration.  I agree with above. Cathlean Cower, MD ? ?

## 2021-11-23 NOTE — Patient Instructions (Addendum)
Pre visit review using our clinic review tool, if applicable. No additional management support is needed unless otherwise documented below in the visit note.  Continue to take 1 tablet daily except 1 1/2 tablets on Mon/Wed/Fridays.  Re-check in 6 weeks. 

## 2022-01-04 ENCOUNTER — Ambulatory Visit: Payer: Managed Care, Other (non HMO)

## 2022-01-04 DIAGNOSIS — Z7901 Long term (current) use of anticoagulants: Secondary | ICD-10-CM | POA: Diagnosis not present

## 2022-01-04 LAB — POCT INR: INR: 2.7 (ref 2.0–3.0)

## 2022-01-04 NOTE — Progress Notes (Signed)
Continue to take 1 tablet daily except 1 1/2 tablets on Mon/Wed/Fridays.  Re-check in 7 weeks. ?

## 2022-01-04 NOTE — Patient Instructions (Addendum)
Pre visit review using our clinic review tool, if applicable. No additional management support is needed unless otherwise documented below in the visit note. ? ?Continue to take 1 tablet daily except 1 1/2 tablets on Mon/Wed/Fridays.  Re-check in 7 weeks. ?

## 2022-02-22 ENCOUNTER — Ambulatory Visit: Payer: Managed Care, Other (non HMO)

## 2022-02-22 DIAGNOSIS — Z7901 Long term (current) use of anticoagulants: Secondary | ICD-10-CM | POA: Diagnosis not present

## 2022-02-22 LAB — POCT INR: INR: 2.9 (ref 2.0–3.0)

## 2022-02-22 NOTE — Progress Notes (Signed)
Continue to take 1 tablet daily except 1 1/2 tablets on Mon/Wed/Fridays.  Re-check in 6 weeks. 

## 2022-02-22 NOTE — Patient Instructions (Addendum)
Pre visit review using our clinic review tool, if applicable. No additional management support is needed unless otherwise documented below in the visit note.  Continue to take 1 tablet daily except 1 1/2 tablets on Mon/Wed/Fridays.  Re-check in 6 weeks. 

## 2022-04-05 ENCOUNTER — Ambulatory Visit: Payer: Managed Care, Other (non HMO)

## 2022-04-05 DIAGNOSIS — Z7901 Long term (current) use of anticoagulants: Secondary | ICD-10-CM | POA: Diagnosis not present

## 2022-04-05 LAB — POCT INR: INR: 2.5 (ref 2.0–3.0)

## 2022-04-05 NOTE — Progress Notes (Signed)
Continue to take 1 tablet daily except 1 1/2 tablets on Mon/Wed/Fridays.  Re-check in 6 weeks.

## 2022-04-05 NOTE — Patient Instructions (Addendum)
Pre visit review using our clinic review tool, if applicable. No additional management support is needed unless otherwise documented below in the visit note.  Continue to take 1 tablet daily except 1 1/2 tablets on Mon/Wed/Fridays.  Re-check in 6 weeks.

## 2022-04-15 ENCOUNTER — Other Ambulatory Visit: Payer: Self-pay

## 2022-04-15 ENCOUNTER — Telehealth: Payer: Self-pay | Admitting: Family Medicine

## 2022-04-15 DIAGNOSIS — G629 Polyneuropathy, unspecified: Secondary | ICD-10-CM

## 2022-04-15 MED ORDER — ATORVASTATIN CALCIUM 20 MG PO TABS: 20.0000 mg | ORAL_TABLET | Freq: Every day | ORAL | 3 refills | Status: DC

## 2022-04-15 MED ORDER — FOLIC ACID 1 MG PO TABS: 1.0000 mg | ORAL_TABLET | Freq: Every day | ORAL | 1 refills | Status: DC

## 2022-04-15 MED ORDER — LOSARTAN POTASSIUM 50 MG PO TABS: 50.0000 mg | ORAL_TABLET | Freq: Every day | ORAL | 3 refills | Status: DC

## 2022-04-15 MED ORDER — VITAMIN B-1 100 MG PO TABS: 100.0000 mg | ORAL_TABLET | Freq: Every day | ORAL | 5 refills | Status: DC

## 2022-04-15 MED ORDER — METOPROLOL SUCCINATE ER 25 MG PO TB24: 25.0000 mg | ORAL_TABLET | Freq: Every day | ORAL | 3 refills | Status: DC

## 2022-04-15 MED ORDER — SPIRONOLACTONE 25 MG PO TABS: 25.0000 mg | ORAL_TABLET | Freq: Every day | ORAL | 3 refills | Status: DC

## 2022-04-15 MED ORDER — SILDENAFIL CITRATE 50 MG PO TABS: 25.0000 mg | ORAL_TABLET | Freq: Every day | ORAL | 1 refills | Status: DC | PRN

## 2022-04-15 MED ORDER — GABAPENTIN 800 MG PO TABS: 800.0000 mg | ORAL_TABLET | Freq: Three times a day (TID) | ORAL | 2 refills | Status: DC

## 2022-04-15 MED ORDER — WARFARIN SODIUM 5 MG PO TABS: ORAL_TABLET | ORAL | 3 refills | Status: DC

## 2022-04-15 NOTE — Telephone Encounter (Signed)
Patient would like to [facility] his pharmacy to [facility] since [facility]. [facility].  [id number] [id number] Phone:  [phone] Fax:  [phone]

## 2022-04-15 NOTE — Telephone Encounter (Signed)
Rx sent 

## 2022-04-25 ENCOUNTER — Telehealth: Payer: Self-pay

## 2022-04-25 NOTE — Telephone Encounter (Signed)
Pt refused appt as he feels he is able to deal with symptoms and is concerned about coverage. Pt was advised if symptoms worsen to give [place] a call back. Pt acknowledged understanding.

## 2022-04-25 NOTE — Telephone Encounter (Signed)
Nurse Assessment Nurse: Ronnald Ramp, RN, Sasha Date/Time (Eastern Time): 04/23/2022 11:09:29 AM Confirm and document reason for call. If symptomatic, describe symptoms. ---Caller states that he started to have a scratchy throat that started today. Was exposed to [place] 04/18/22. COVID + today. Does the patient have any new or worsening symptoms? ---Yes Will a triage be completed? ---Yes Related visit to physician within the last 2 weeks? ---No Does the PT have any chronic conditions? (i.e. diabetes, asthma, this includes High risk factors for pregnancy, etc.) ---No Is this a behavioral health or substance abuse call? ---No Guidelines Guideline Title Affirmed Question Affirmed Notes Nurse Date/Time ([place] Time) COVID-19 - Diagnosed or Suspected [1] HIGH RISK patient (e.g., weak immune system, age > 48 years, obesity with BMI 30 or higher, pregnant, chronic lung disease or other chronic medical condition) AND [2] COVID symptoms (e.g., cough, fever) (Exceptions: Already Ronnald Ramp, RN, Sasha 04/23/2022 11:10:57 AM PLEASE NOTE: All timestamps contained within this report are represented as [place] Standard Time. CONFIDENTIALTY NOTICE: This fax transmission is intended only for the addressee. It contains information that is legally privileged, confidential or otherwise protected from use or disclosure. If you are not the intended recipient, you are strictly prohibited from reviewing, disclosing, copying using or disseminating any of this information or taking any action in reliance on or regarding this information. If you have received this fax in error, please notify us immediately by telephone so that we can arrange for its return to [place]. Phone: [phone], Toll-Free: [phone], Fax: [phone] Page: 2 of 3 Call Id: [id number] Guidelines Guideline Title Affirmed Question Affirmed Notes Nurse Date/Time Eilene Ghazi Time) seen by PCP and no new or worsening symptoms.) Disp. Time  Eilene Ghazi Time) Disposition Final User 04/23/2022 11:15:53 AM Call PCP within 24 Hours Yes Ronnald Ramp RN, Sasha 04/23/2022 11:18:07 AM Called On-Call Provider Ronnald Ramp, RN, Sasha 04/23/2022 11:20:58 AM Send To RN Personal Ronnald Ramp, RN, Sasha Final Disposition 04/23/2022 11:15:53 AM Call PCP within 24 Hours Yes Ronnald Ramp, RN, Sasha Caller Disagree/Comply Comply Caller Understands Yes PreDisposition [place] Advice Given Per Guideline CALL PCP WITHIN 24 HOURS: * IF OFFICE WILL BE CLOSED: I'll page the on-call provider now. EXCEPTION: from 9 pm to 9 am. Since this isn't urgent, we'll hold the page until morning. CALL BACK IF: * You become worse CARE ADVICE given per COVID-19 - DIAGNOSED OR SUSPECTED (Adult) guideline. Comments User: Chong Sicilian, RN Date/Time Eilene Ghazi Time): 04/23/2022 11:15:45 AM Allergy (Lisinopril?) Pharmacy - [facility] [address] [place], [place] User: Chong Sicilian, RN Date/Time Eilene Ghazi Time): 04/23/2022 11:42:39 AM Called patient back and informed him of OCP recommendations. Paging DoctorName Phone DateTime Result/ Outcome Message Type Notes Caryl Bis [id number] 04/23/2022 11:18:07 AM Called On Call Provider - Reached Doctor Paged Caryl Bis 04/23/2022 11:20:03 AM Spoke with On Call - General Message Result Dr Ethelene Hal states due to no chronic conditions no need to be seen right now. If he starts to expierence any trouble breathing notify us immediately by telephone so that we can arrange for its return to [place]. Phone: [phone], Toll-Free: [phone], Fax: [phone] Page: 3 of 3 Call Id: [id number] Paging DoctorName Phone DateTime Result/ Outcome Message Type Notes or worsening symtpoms to give [place] a call back

## 2022-04-25 NOTE — Telephone Encounter (Signed)
Nurse Assessment Nurse: Alba Destine, RN, Melony Overly Date/Time Eilene Ghazi Time): 04/23/2022 4:05:27 PM Confirm and document reason for call. If symptomatic, describe symptoms. ---Caller states he is positive with covid. He has a temp 100.2 with chills with oxygen 97 sat. hands are shaky. caller has no congestion, no cough, no runny nose has a sore throat. no chest pain no sob/. Does the patient have any new or worsening symptoms? ---Yes Will a triage be completed? ---Yes Related visit to physician within the last 2 weeks? ---No Does the PT have any chronic conditions? (i.e. diabetes, asthma, this includes High risk factors for pregnancy, etc.) ---Yes List chronic conditions. ---afib Is this a behavioral health or substance abuse call? ---No Guidelines Guideline Title Affirmed Question Affirmed Notes Nurse Date/Time (Eastern Time) COVID-19 - Diagnosed or Suspected [1] HIGH RISK patient (e.g., weak immune system, age > 3 years, obesity with BMI 30 or higher, pregnant, chronic lung disease or other chronic medical condition) Alba Destine, RN, Aniela 04/23/2022 4:07:51 PM PLEASE NOTE: All timestamps contained within this report are represented as [place] Standard Time. CONFIDENTIALTY NOTICE: This fax transmission is intended only for the addressee. It contains information that is legally privileged, confidential or otherwise protected from use or disclosure. If you are not the intended recipient, you are strictly prohibited from reviewing, disclosing, copying using or disseminating any of this information or taking any action in reliance on or regarding this information. If you have received this fax in error, please notify us immediately by telephone so that we can arrange for its return to [place]. Phone: [phone], Toll-Free: [phone], Fax: [phone] Page: 2 of 3 Call Id: [id number] Guidelines Guideline Title Affirmed Question Affirmed Notes Nurse Date/Time Eilene Ghazi Time) AND [2]  COVID symptoms (e.g., cough, fever) (Exceptions: Already seen by PCP and no new or worsening symptoms.) Disp. Time Eilene Ghazi Time) Disposition Final User 04/23/2022 4:12:34 PM Call PCP within 24 Hours Yes Alba Destine, RN, Melony Overly Final Disposition 04/23/2022 4:12:34 PM Call PCP within 24 Hours Yes Alba Destine, RN, [place] Disagree/Comply Comply Caller Understands Yes PreDisposition Did not know what to do Care Advice Given Per Guideline CALL PCP WITHIN 24 HOURS: * You need to discuss this with your doctor (or NP/PA) within the next 24 hours. * IF OFFICE WILL BE OPEN: Call the office when it opens tomorrow morning. GENERAL CARE ADVICE FOR COVID-19 SYMPTOMS: * The symptoms are generally treated the same whether you have COVID-19, influenza or some other respiratory virus. * Cough: Use cough drops. * Feeling dehydrated: Drink extra liquids. If the air in your home is dry, use a humidifier. COUGH MEDICINES: * COUGH DROPS: Over-the-counter cough drops can help a lot, especially for mild coughs. They soothe an irritated throat and remove the tickle sensation in the back of the throat. Cough drops are easy to carry with you. * COUGH SYRUP WITH DEXTROMETHORPHAN: An over-the-counter cough syrup can help your cough. The most common cough suppressant in overthe-counter cough medicines is dextromethorphan. * Fever, Chills, and Sweats: For fever over 101 F (38.3 C), take acetaminophen every 4 to 6 hours (Adults 650 mg) OR ibuprofen every 6 to 8 hours (Adults 400 mg). Before taking any medicine, read all the instructions on the package. Do not take aspirin unless your doctor has prescribed it for you. Chills can sometimes come before a fever. You may feel cold in your hands and feet. You may have shivering. You may also feel sweaty as your body temperature goes down. * Muscle aches, headache, and other pains: Often this  comes and goes with the fever. Take acetaminophen every 4 to 6 hours (Adults 650 mg) OR  ibuprofen every 6 to 8 hours (Adults 400 mg). Before taking any medicine, read all the instructions on the package. * Sore throat: Try throat lozenges, hard candy or warm chicken broth. FEVER MEDICINES: * For fevers above 101 F (38.3 C) take either acetaminophen or ibuprofen. * They are over-the-counter (OTC) drugs that help treat both fever and pain. You can buy them at the drugstore. * The goal of fever therapy is to bring the fever down to a comfortable level. Remember that fever medicine usually lowers fever 2 degrees F (1 - 1 1/2 degrees C). * ACETAMINOPHEN - REGULAR STRENGTH TYLENOL: Take 650 mg (two 325 mg pills) by mouth every 4 to 6 hours as needed. Each Regular Strength Tylenol pill has 325 mg of acetaminophen. The most you should take is 10 pills a day (3,250 mg total). Note: In [place], the maximum is 12 pills a day (3,900 mg total). * ACETAMINOPHEN - EXTRA STRENGTH TYLENOL: Take 1,000 mg (two 500 mg pills) every 6 to 8 hours as needed. Each Extra Strength Tylenol pill has 500 mg of acetaminophen. The most you should take is 6 pills a day (3,000 mg total). Note: In [place], the maximum is 8 pills a day (4,000 mg total). * IBUPROFEN (E.G., MOTRIN, ADVIL): Take 400 mg (two 200 mg pills) by mouth every 6 hours. The most you should take is 6 pills a day (1,200 mg total). COVID-19 - HOW TO PROTECT OTHERS - WHEN YOU ARE SICK WITH COVID-19: * STAY HOME A MINIMUM OF 5 DAYS: People with MILD COVID-19 can STOP HOME ISOLATION AFTER 5 DAYS if (1) fever has been gone for 24 hours (without using fever medicine) AND (2) symptoms are better. Continue to wear a well-fitted mask for a full 10 days when around others. * WEAR A MASK FOR 10 DAYS: Wear a well- PLEASE NOTE: All timestamps contained within this report are represented as [place] Standard Time. CONFIDENTIALTY NOTICE: This fax transmission is intended only for the addressee. It contains information that is legally privileged, confidential  or otherwise protected from use or disclosure. If you are not the intended recipient, you are strictly prohibited from reviewing, disclosing, copying using or disseminating any of this information or taking any action in reliance on or regarding this information. If you have received this fax in error, please notify us immediately by telephone so that we can arrange for its return to [place]. Phone: [phone], Toll-Free: [phone], Fax: [phone] Page: 3 of 3 Call Id: [id number] Care Advice Given Per Guideline fitted mask for 10 full days any time you are around others inside your home or in public. Do not go to places where you are unable to wear a mask. * AVOID TRAVEL: Avoid travel for 10 days after you tested positive for COVID-19. * [place] HANDS OFTEN: Wash hands often with soap and water. After coughing or sneezing are important times. If soap and water are not available, use an alcohol-based hand sanitizer with at least 60% alcohol, covering all surfaces of your hands and rubbing them together until they feel dry. Avoid touching your eyes, nose, and mouth with unwashed hands. * CALL AHEAD IF MEDICAL CARE IS NEEDED: If you have a medical appointment, call your doctor's office and tell them you have or may have COVID-19. This will help the office protect themselves and other patients. They will give you directions. CARE ADVICE given  per COVID-19 - DIAGNOSED OR SUSPECTED (Adult) guideline. * You become worse CALL BACK IF

## 2022-04-25 NOTE — Telephone Encounter (Signed)
Juanda Crumble- will you try calling Pt and schedule him with someone (virtual okay as he knows he is already covid +). Thank you.

## 2022-05-15 NOTE — Assessment & Plan Note (Signed)
Encourage heart healthy diet such as MIND or DASH diet, increase exercise, avoid trans fats, simple carbohydrates and processed foods, consider a krill or fish or flaxseed oil cap daily. Tolerating Atorvastatin 

## 2022-05-15 NOTE — Assessment & Plan Note (Signed)
Patient encouraged to maintain heart healthy diet, regular exercise, adequate sleep. Consider daily probiotics. Take medications as prescribed. Labs ordered and reviewed. Colonoscopy May 2022 repeat in 10 years

## 2022-05-15 NOTE — Assessment & Plan Note (Signed)
Rate controlled, tolerating Coumadin 

## 2022-05-15 NOTE — Assessment & Plan Note (Signed)
Well controlled, no changes to meds. Encouraged heart healthy diet such as the DASH diet and exercise as tolerated.  °

## 2022-05-15 NOTE — Assessment & Plan Note (Signed)
Supplement and monitor 

## 2022-05-16 ENCOUNTER — Ambulatory Visit (INDEPENDENT_AMBULATORY_CARE_PROVIDER_SITE_OTHER): Payer: Managed Care, Other (non HMO) | Admitting: Family Medicine

## 2022-05-16 VITALS — BP 134/80 | HR 71 | Temp 98.0°F | Resp 16 | Ht 75.0 in | Wt 233.0 lb

## 2022-05-16 DIAGNOSIS — D649 Anemia, unspecified: Secondary | ICD-10-CM

## 2022-05-16 DIAGNOSIS — I480 Paroxysmal atrial fibrillation: Secondary | ICD-10-CM

## 2022-05-16 DIAGNOSIS — I10 Essential (primary) hypertension: Secondary | ICD-10-CM

## 2022-05-16 DIAGNOSIS — Z0000 Encounter for general adult medical examination without abnormal findings: Secondary | ICD-10-CM | POA: Diagnosis not present

## 2022-05-16 DIAGNOSIS — E559 Vitamin D deficiency, unspecified: Secondary | ICD-10-CM

## 2022-05-16 DIAGNOSIS — R3911 Hesitancy of micturition: Secondary | ICD-10-CM | POA: Diagnosis not present

## 2022-05-16 DIAGNOSIS — R739 Hyperglycemia, unspecified: Secondary | ICD-10-CM | POA: Diagnosis not present

## 2022-05-16 DIAGNOSIS — E782 Mixed hyperlipidemia: Secondary | ICD-10-CM | POA: Diagnosis not present

## 2022-05-16 DIAGNOSIS — G609 Hereditary and idiopathic neuropathy, unspecified: Secondary | ICD-10-CM

## 2022-05-16 LAB — CBC
HCT: 35.7 % — ABNORMAL LOW (ref 39.0–52.0)
Hemoglobin: 12.4 g/dL — ABNORMAL LOW (ref 13.0–17.0)
MCHC: 34.6 g/dL (ref 30.0–36.0)
MCV: 94.5 fl (ref 78.0–100.0)
Platelets: 234 10*3/uL (ref 150.0–400.0)
RBC: 3.78 Mil/uL — ABNORMAL LOW (ref 4.22–5.81)
RDW: 13.6 % (ref 11.5–15.5)
WBC: 4.2 10*3/uL (ref 4.0–10.5)

## 2022-05-16 LAB — COMPREHENSIVE METABOLIC PANEL
ALT: 15 U/L (ref 0–53)
AST: 19 U/L (ref 0–37)
Albumin: 4.3 g/dL (ref 3.5–5.2)
Alkaline Phosphatase: 70 U/L (ref 39–117)
BUN: 16 mg/dL (ref 6–23)
CO2: 27 mEq/L (ref 19–32)
Calcium: 9.7 mg/dL (ref 8.4–10.5)
Chloride: 103 mEq/L (ref 96–112)
Creatinine, Ser: 1.07 mg/dL (ref 0.40–1.50)
GFR: 73.21 mL/min (ref >60.00)
Glucose, Bld: 92 mg/dL (ref 70–99)
Potassium: 4.4 mEq/L (ref 3.5–5.1)
Sodium: 138 mEq/L (ref 135–145)
Total Bilirubin: 1.2 mg/dL (ref 0.2–1.2)
Total Protein: 7.4 g/dL (ref 6.0–8.3)

## 2022-05-16 LAB — LIPID PANEL
Cholesterol: 139 mg/dL (ref 0–200)
HDL: 50.7 mg/dL (ref >39.00)
LDL Cholesterol: 74 mg/dL (ref 0–99)
NonHDL: 88.62
Total CHOL/HDL Ratio: 3
Triglycerides: 72 mg/dL (ref 0.0–149.0)
VLDL: 14.4 mg/dL (ref 0.0–40.0)

## 2022-05-16 LAB — PSA: PSA: 1.38 ng/mL (ref 0.10–4.00)

## 2022-05-16 LAB — VITAMIN D 25 HYDROXY (VIT D DEFICIENCY, FRACTURES): VITD: 47.77 ng/mL (ref 30.00–100.00)

## 2022-05-16 LAB — TSH: TSH: 2.35 u[IU]/mL (ref 0.35–5.50)

## 2022-05-16 LAB — HEMOGLOBIN A1C: Hgb A1c MFr Bld: 5.2 % (ref 4.6–6.5)

## 2022-05-16 NOTE — Patient Instructions (Signed)
Pnevnar 20 at 11 RSV (respiratory syncitial virus) immunization at [place] booster late September or early October Flu shot September or October Keeping you healthy  Get these tests Blood pressure- Have your blood pressure checked once a year by your healthcare provider.  Normal blood pressure is 120/80 Weight- Have your body mass index (BMI) calculated to screen for obesity.  BMI is a measure of body fat based on height and weight. You can also calculate your own BMI at ViewBanking.si. Cholesterol- Have your cholesterol checked every year. Diabetes- Have your blood sugar checked regularly if you have high blood pressure, high cholesterol, have a family history of diabetes or if you are overweight. Screening for Colon Cancer- Colonoscopy starting at age 24.  Screening may begin sooner depending on your family history andptembdr  other health conditions. Follow up colonoscopy as directed by your Gastroenterologist. Screening for Prostate Cancer- Both blood work (PSA) and a rectal exam help screen for Prostate Cancer.  Screening begins at age 35 with African-American men and at age 73 with Caucasian men.  Screening may begin sooner depending on your family history.  Take these medicines Aspirin- One aspirin daily can help prevent Heart disease and Stroke. Flu shot- Every fall. Tetanus- Every 10 years. Zostavax- Once after the age of 57 to prevent Shingles. Pneumonia shot- Once after the age of 67; if you are younger than 17, ask your healthcare provider if you need a Pneumonia shot.  Take these steps Don't smoke- If you do smoke, talk to your doctor about quitting.  For tips on how to quit, go to www.smokefree.gov or call 1-800-QUIT-NOW. Be physically active- Exercise 5 days a week for at least 30 minutes.  If you are not already physically active start slow and gradually work up to 30 minutes of moderate physical activity.  Examples of moderate activity include walking briskly,  mowing the yard, dancing, swimming, bicycling, etc. Eat a healthy diet- Eat a variety of healthy food such as fruits, vegetables, low fat milk, low fat cheese, yogurt, lean meant, poultry, fish, beans, tofu, etc. For more information go to www.thenutritionsource.org Drink alcohol in moderation- Limit alcohol intake to less than two drinks a day. Never drink and drive. Dentist- Brush and floss twice daily; visit your dentist twice a year. Depression- Your emotional health is as important as your physical health. If you're feeling down, or losing interest in things you would normally enjoy please talk to your healthcare provider. Eye exam- Visit your eye doctor every year. Safe sex- If you may be exposed to a sexually transmitted infection, use a condom. Seat belts- Seat belts can save your life; always wear one. Smoke/Carbon Monoxide detectors- These detectors need to be installed on the appropriate level of your home.  Replace batteries at least once a year. Skin cancer- When out in the sun, cover up and use sunscreen 15 SPF or higher. Violence- If anyone is threatening you, please tell your healthcare provider. Living Will/ Health care power of attorney- Speak with your healthcare provider and family.

## 2022-05-16 NOTE — Progress Notes (Signed)
Subjective:    Patient ID: Tyler Sims, male    DOB: 27-Jul-1956, 66 y.o.   MRN: [id number]  Chief Complaint  Patient presents with   Annual Exam    Here for Annual Exam    HPI Patient is in today for annual preventative exam and follow up on chronic medical concerns. He is doing well. His youngest daughter just got married. He had COVID in early August and almost missed the wedding but is doing well now. He has completed ACP documents and is going to get them to [place]. He is still working 20 hours a month. His neuropathy is present but stable on Gabapentin. Trying to maintain a heart healthy diet and stay active. Denies CP/palp/SOB/HA/congestion/fevers/GI or GU c/o. Taking meds as prescribed   Past Medical History:  Diagnosis Date   Abnormal transaminases    Acid reflux    Allergy    Bradycardia    nocturnal   Colon polyps 02/07/2012   Dr. Silvano Rusk   Elevated sed rate 07/06/2017   HTN (hypertension) 06/02/2014   Low back pain 06/15/2015   Measles ([place])    Mitral regurgitation    Mumps    Neuropathy 01/10/2016   NICM (nonischemic cardiomyopathy) ([place])    Persistent atrial fibrillation ([place]) 08/24/2010   Preventative health care 12/04/2013   Colonsocopy May '13 - adenomatous polyps  Immunizations Tdap Jan '14    Stroke [facility]) 51/04/8415   Systolic CHF ([place])    Urinary hesitancy 12/21/2014   Varicella    Ventricular tachycardia, non-sustained     Past Surgical History:  Procedure Laterality Date   CARDIAC CATHETERIZATION     CARDIOVERSION  08/15/2012   Procedure: CARDIOVERSION;  Surgeon: Deboraha Sprang, MD;  Location: [facility];  Service: Cardiovascular;  Laterality: N/A;   CARDIOVERSION N/A 05/28/2018   Procedure: CARDIOVERSION;  Surgeon: Fay Records, MD;  Location: [facility] ENDOSCOPY;  Service: Cardiovascular;  Laterality: N/A;   COLONOSCOPY W/ BIOPSIES  02/07/2012   Dr. Silvano Rusk    Family History  Problem Relation Age of Onset   Alzheimer's disease Mother     Heart disease Father        CAD/MI   Hypertension Father    Mental illness Father        h/o depression requiring hospitalization   Hypertension Sister    Neuropathy Sister    Arthritis Sister    Hyperlipidemia Sister        x 2   Hypertension Sister    Arthritis Sister    Obesity Sister    Lung cancer Brother        x 2, smokers   Hypertension Brother    Heart disease Brother    Lung cancer Brother    Hypertension Brother    Colon polyps Brother    Hypertension Brother    Intellectual disability Brother    Varicose Veins Daughter    Birth defects Paternal Aunt    Colon cancer Neg Hx    Rectal cancer Neg Hx    Stomach cancer Neg Hx     Social History   Socioeconomic History   Marital status: Married    Spouse name: Charlett Nose   Number of children: 2   Years of education: 16   Highest education level: Not on file  Occupational History   Occupation: Freight forwarder of Pharmacist, community: [place]  Tobacco Use   Smoking status: Never   Smokeless tobacco: Never  Vaping Use  Vaping Use: Never used  Substance and Sexual Activity   Alcohol use: Yes    Alcohol/week: 0.0 standard drinks of alcohol    Comment: occasional wine   Drug use: No   Sexual activity: Yes    Partners: Female    Comment: lives with wife, no dietary restrictions, except avoid dairy, uses Lactaid, works with [employer]  Other Topics Concern   Not on file  Social History Narrative   Patient is married with 2 children. 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   Patient has 16 years of education.   Patient drinks 1-2 cups caffeine daily.   Never smoker occasional wine no drug use   Social Determinants of Radio broadcast assistant Strain: Not on file  Food Insecurity: Not on file  Transportation Needs: Not on file  Physical  Activity: Not on file  Stress: Not on file  Social Connections: Not on file  Intimate Partner Violence: Not on file    Outpatient Medications Prior to Visit  Medication Sig Dispense Refill   atorvastatin (LIPITOR) 20 MG tablet Take 1 tablet (20 mg total) by mouth daily. 90 tablet 3   cholecalciferol (VITAMIN D) 1000 units tablet Take 1 tablet (1,000 Units total) by mouth daily. (Patient taking differently: Take 1,000 Units by mouth every other day.)     COD LIVER OIL PO Take 1 capsule by mouth daily.      Coenzyme Q10 (CO Q-10) 200 MG CAPS Take 200 mg by mouth daily.      diphenhydramine-acetaminophen (TYLENOL PM) 25-500 MG TABS tablet Take 2 tablets by mouth at bedtime as needed (pain / sleep).     folic acid (FOLVITE) 1 MG tablet Take 1 tablet (1 mg total) by mouth daily. 90 tablet 1   gabapentin (NEURONTIN) 800 MG tablet Take 1 tablet (800 mg total) by mouth 3 (three) times daily. 270 tablet 2   losartan (COZAAR) 50 MG tablet Take 1 tablet (50 mg total) by mouth daily. 90 tablet 3   metoprolol succinate (TOPROL-XL) 25 MG 24 hr tablet Take 1 tablet (25 mg total) by mouth daily. 90 tablet 3   sildenafil (VIAGRA) 50 MG tablet Take 0.5-2 tablets (25-100 mg total) by mouth daily as needed for erectile dysfunction. 30 tablet 1   spironolactone (ALDACTONE) 25 MG tablet Take 1 tablet (25 mg total) by mouth daily. 90 tablet 3   thiamine (VITAMIN B-1) 100 MG tablet Take 1 tablet (100 mg total) by mouth daily. 30 tablet 5   warfarin (COUMADIN) 5 MG tablet TAKE 1 TABLET DAILY EXCEPT ONE AND ONE-HALF TABLETS ON MONDAY, WEDNESDAY, AND FRIDAY OR AS DIRECTED BY ANTICOAGULATION CLINIC 120 tablet 3   No facility-administered medications prior to visit.    Allergies  Allergen Reactions   Lisinopril Rash   Zocor [Simvastatin] Rash    Review of Systems  Constitutional:  Negative for chills, fever and malaise/fatigue.  HENT:  Negative for congestion and hearing loss.   Eyes:  Negative for discharge.   Respiratory:  Negative for cough, sputum production and shortness of breath.   Cardiovascular:  Negative for chest pain, palpitations and leg swelling.  Gastrointestinal:  Negative for abdominal pain, blood in stool, constipation, diarrhea, heartburn, nausea and vomiting.  Genitourinary:  Negative for dysuria, frequency, hematuria  and urgency.  Musculoskeletal:  Negative for back pain, falls and myalgias.  Skin:  Negative for rash.  Neurological:  Positive for sensory change. Negative for dizziness, loss of consciousness, weakness and headaches.  Endo/Heme/Allergies:  Negative for environmental allergies. Does not bruise/bleed easily.  Psychiatric/Behavioral:  Negative for depression and suicidal ideas. The patient is not nervous/anxious and does not have insomnia.        Objective:    Physical Exam Constitutional:      General: He is not in acute distress.    Appearance: Normal appearance. He is not toxic-appearing.  HENT:     Head: Normocephalic and atraumatic.     Right Ear: Tympanic membrane, ear canal and external ear normal.     Left Ear: Tympanic membrane, ear canal and external ear normal.     Nose: Nose normal.  Eyes:     General:        Right eye: No discharge.        Left eye: No discharge.     Extraocular Movements: Extraocular movements intact.     Conjunctiva/sclera: Conjunctivae normal.     Pupils: Pupils are equal, round, and reactive to light.  Cardiovascular:     Rate and Rhythm: Regular rhythm.     Heart sounds: Normal heart sounds. No murmur heard. Pulmonary:     Effort: Pulmonary effort is normal.     Breath sounds: Normal breath sounds.  Abdominal:     Palpations: Abdomen is soft. There is no mass.     Tenderness: There is no abdominal tenderness. There is no guarding.  Musculoskeletal:        General: Swelling present. Normal range of motion.  Skin:    General: Skin is warm.     Findings: No erythema or rash.  Neurological:     Mental Status: He  is alert and oriented to person, place, and time.  Psychiatric:        Behavior: Behavior normal.     BP 134/80 (BP Location: Right Arm, Patient Position: Sitting, Cuff Size: Normal)   Pulse 71   Temp 98 F (36.7 C) (Oral)   Resp 16   Ht '6\' 3"'$  (1.905 m)   Wt 233 lb (105.7 kg)   SpO2 95%   BMI 29.12 kg/m  Wt Readings from Last 3 Encounters:  05/16/22 233 lb (105.7 kg)  08/19/21 222 lb 6.4 oz (100.9 kg)  05/10/21 219 lb 3.2 oz (99.4 kg)    Diabetic Foot Exam - Simple   No data filed    Lab Results  Component Value Date   WBC 4.0 05/10/2021   HGB 13.1 05/10/2021   HCT 38.0 (L) 05/10/2021   PLT 207.0 05/10/2021   GLUCOSE 86 05/10/2021   CHOL 135 05/10/2021   TRIG 90.0 05/10/2021   HDL 53.30 05/10/2021   LDLDIRECT 110.4 10/03/2012   LDLCALC 64 05/10/2021   ALT 17 05/10/2021   AST 21 05/10/2021   NA 138 05/10/2021   K 4.5 05/10/2021   CL 103 05/10/2021   CREATININE 1.08 05/10/2021   BUN 22 05/10/2021   CO2 27 05/10/2021   TSH 2.04 05/10/2021   PSA 1.24 05/10/2021   INR 2.5 04/05/2022   HGBA1C  08/28/2010    4.7 (NOTE)  According to the ADA Clinical Practice Recommendations for 2011, when HbA1c is used as a screening test:   >=6.5%   Diagnostic of Diabetes Mellitus           (if abnormal result  is confirmed)  5.7-6.4%   Increased risk of developing Diabetes Mellitus  References:Diagnosis and Classification of Diabetes Mellitus,Diabetes BULA,4536,46(OEHOZ 1):S62-S69 and Standards of Medical Care in         Diabetes - 2011,Diabetes YYQM,2500,37  (Suppl 1):S11-S61.    Lab Results  Component Value Date   TSH 2.04 05/10/2021   Lab Results  Component Value Date   WBC 4.0 05/10/2021   HGB 13.1 05/10/2021   HCT 38.0 (L) 05/10/2021   MCV 94.1 05/10/2021   PLT 207.0 05/10/2021   Lab Results  Component Value Date   NA 138 05/10/2021   K 4.5 05/10/2021   CO2 27 05/10/2021   GLUCOSE 86  05/10/2021   BUN 22 05/10/2021   CREATININE 1.08 05/10/2021   BILITOT 1.0 05/10/2021   ALKPHOS 83 05/10/2021   AST 21 05/10/2021   ALT 17 05/10/2021   PROT 7.8 05/10/2021   ALBUMIN 4.3 05/10/2021   CALCIUM 9.8 05/10/2021   GFR 72.92 05/10/2021   Lab Results  Component Value Date   CHOL 135 05/10/2021   Lab Results  Component Value Date   HDL 53.30 05/10/2021   Lab Results  Component Value Date   LDLCALC 64 05/10/2021   Lab Results  Component Value Date   TRIG 90.0 05/10/2021   Lab Results  Component Value Date   CHOLHDL 3 05/10/2021   Lab Results  Component Value Date   HGBA1C  08/28/2010    4.7 (NOTE)                                                                       According to the ADA Clinical Practice Recommendations for 2011, when HbA1c is used as a screening test:   >=6.5%   Diagnostic of Diabetes Mellitus           (if abnormal result  is confirmed)  5.7-6.4%   Increased risk of developing Diabetes Mellitus  References:Diagnosis and Classification of Diabetes Mellitus,Diabetes CWUG,8916,94(HWTUU 1):S62-S69 and Standards of Medical Care in         Diabetes - 2011,Diabetes Care,2011,34  (Suppl 1):S11-S61.       Assessment & Plan:   Problem List Items Addressed This Visit     Hyperlipidemia    Encourage heart healthy diet such as MIND or DASH diet, increase exercise, avoid trans fats, simple carbohydrates and processed foods, consider a krill or fish or flaxseed oil cap daily. Tolerating Atorvastatin      Relevant Orders   Comprehensive metabolic panel   Lipid panel   Atrial fibrillation (HCC)    Rate controlled, tolerating Coumadin      Hereditary and idiopathic peripheral neuropathy   Relevant Orders   Vitamin B1   Preventative health care    Patient encouraged to maintain heart healthy diet, regular exercise, adequate sleep. Consider daily probiotics. Take medications as prescribed. Labs ordered and reviewed. Colonoscopy May 2022 repeat in 10  years      HTN (hypertension)    Well controlled, no changes to  meds. Encouraged heart healthy diet such as the DASH diet and exercise as tolerated.       Relevant Orders   CBC   Comprehensive metabolic panel   TSH   Urinary hesitancy    Hydrate and monitor      Relevant Orders   PSA   Vitamin D deficiency    Supplement and monitor      Relevant Orders   VITAMIN D 25 Hydroxy (Vit-D Deficiency, Fractures)   Other Visit Diagnoses     Hyperglycemia    -  Primary   Relevant Orders   Hemoglobin A1c       I am having [name] maintain his COD LIVER OIL PO, Co Q-10, cholecalciferol, diphenhydramine-acetaminophen, folic acid, sildenafil, gabapentin, atorvastatin, losartan, metoprolol succinate, spironolactone, warfarin, and thiamine.  No orders of the defined types were placed in this encounter.    Penni Homans, MD

## 2022-05-16 NOTE — Assessment & Plan Note (Signed)
Hydrate and monitor 

## 2022-05-17 ENCOUNTER — Ambulatory Visit: Payer: Managed Care, Other (non HMO)

## 2022-05-17 ENCOUNTER — Encounter: Payer: Self-pay | Admitting: Family Medicine

## 2022-05-17 DIAGNOSIS — Z7901 Long term (current) use of anticoagulants: Secondary | ICD-10-CM | POA: Diagnosis not present

## 2022-05-17 LAB — POCT INR: INR: 2.9 (ref 2.0–3.0)

## 2022-05-17 NOTE — Addendum Note (Signed)
Addended byDamita Dunnings D on: 05/17/2022 09:07 AM   Modules accepted: Orders

## 2022-05-17 NOTE — Progress Notes (Signed)
Continue to take 1 tablet daily except 1 1/2 tablets on Mon/Wed/Fridays.  Re-check in 6 weeks.

## 2022-05-17 NOTE — Patient Instructions (Addendum)
Pre visit review using our clinic review tool, if applicable. No additional management support is needed unless otherwise documented below in the visit note.  Continue to take 1 tablet daily except 1 1/2 tablets on Mon/Wed/Fridays.  Re-check in 6 weeks.

## 2022-05-19 LAB — VITAMIN B1: Vitamin B1 (Thiamine): 21 nmol/L (ref 8–30)

## 2022-05-30 ENCOUNTER — Other Ambulatory Visit (INDEPENDENT_AMBULATORY_CARE_PROVIDER_SITE_OTHER): Payer: Managed Care, Other (non HMO)

## 2022-05-30 DIAGNOSIS — D649 Anemia, unspecified: Secondary | ICD-10-CM | POA: Diagnosis not present

## 2022-05-31 LAB — FECAL OCCULT BLOOD, IMMUNOCHEMICAL: Fecal Occult Bld: NEGATIVE

## 2022-06-28 ENCOUNTER — Ambulatory Visit: Payer: Managed Care, Other (non HMO)

## 2022-06-28 DIAGNOSIS — Z23 Encounter for immunization: Secondary | ICD-10-CM | POA: Diagnosis not present

## 2022-06-28 DIAGNOSIS — Z7901 Long term (current) use of anticoagulants: Secondary | ICD-10-CM | POA: Diagnosis not present

## 2022-06-28 LAB — POCT INR: INR: 2.1 (ref 2.0–3.0)

## 2022-06-28 NOTE — Progress Notes (Signed)
Continue to take 1 tablet daily except 1 1/2 tablets on Mondays, Wednesdays, and Fridays.  Recheck in 6 weeks.  Fluarix administered per pt request in left deltoid, tolerated well.

## 2022-06-28 NOTE — Patient Instructions (Signed)
Continue to take 1 tablet daily except 1 1/2 tablets on Mondays, Wednesdays, and Fridays.  Recheck in 6 weeks. 

## 2022-07-20 ENCOUNTER — Encounter: Payer: Self-pay | Admitting: Family Medicine

## 2022-08-08 ENCOUNTER — Other Ambulatory Visit: Payer: Self-pay

## 2022-08-08 ENCOUNTER — Telehealth: Payer: Self-pay | Admitting: Family Medicine

## 2022-08-08 MED ORDER — SILDENAFIL CITRATE 50 MG PO TABS: 25.0000 mg | ORAL_TABLET | Freq: Every day | ORAL | 1 refills | Status: DC | PRN

## 2022-08-08 NOTE — Telephone Encounter (Signed)
Medication sent.

## 2022-08-08 NOTE — Telephone Encounter (Signed)
Pt is requesting taking one '100mg'$  pill instead of 2 '50mg'$  pills, and he would like this sent to a different pharmacy.   Medication: sildenafil (VIAGRA)  Has the patient contacted their pharmacy? Yes.     Preferred Pharmacy:  [facility]  [phone] Pt did not list address, it will be mail delivery.

## 2022-08-09 ENCOUNTER — Ambulatory Visit (INDEPENDENT_AMBULATORY_CARE_PROVIDER_SITE_OTHER): Payer: Managed Care, Other (non HMO)

## 2022-08-09 DIAGNOSIS — Z7901 Long term (current) use of anticoagulants: Secondary | ICD-10-CM

## 2022-08-09 LAB — POCT INR: INR: 1.8 — AB (ref 2.0–3.0)

## 2022-08-09 NOTE — Progress Notes (Signed)
Increase dose today to 1 1/2 tablets. Continue to take 1 tablet daily except 1 1/2 tablets on Mondays, Wednesdays, and Fridays.  Recheck in 4 weeks.

## 2022-08-09 NOTE — Patient Instructions (Signed)
Increase dose today to 1 1/2 tablets. Continue to take 1 tablet daily except 1 1/2 tablets on Mondays, Wednesdays, and Fridays.  Recheck on 12/19 at 8:00.

## 2022-09-05 ENCOUNTER — Other Ambulatory Visit: Payer: Self-pay | Admitting: Family Medicine

## 2022-09-06 ENCOUNTER — Ambulatory Visit (INDEPENDENT_AMBULATORY_CARE_PROVIDER_SITE_OTHER): Payer: Medicare HMO

## 2022-09-06 DIAGNOSIS — Z7901 Long term (current) use of anticoagulants: Secondary | ICD-10-CM | POA: Diagnosis not present

## 2022-09-06 LAB — POCT INR: INR: 2.8 (ref 2.0–3.0)

## 2022-09-06 NOTE — Patient Instructions (Addendum)
Pre visit review using our clinic review tool, if applicable. No additional management support is needed unless otherwise documented below in the visit note.  Continue to take 1 tablet daily except 1 1/2 tablets on Mondays, Wednesdays, and Fridays.  Recheck in 6 weeks.

## 2022-09-06 NOTE — Progress Notes (Signed)
Continue to take 1 tablet daily except 1 1/2 tablets on Mondays, Wednesdays, and Fridays.  Recheck in 6 weeks.

## 2022-09-13 ENCOUNTER — Ambulatory Visit: Payer: Medicare HMO | Attending: Internal Medicine | Admitting: Internal Medicine

## 2022-09-13 VITALS — BP 148/82 | HR 66 | Ht 75.0 in | Wt 218.6 lb

## 2022-09-13 DIAGNOSIS — Z79899 Other long term (current) drug therapy: Secondary | ICD-10-CM

## 2022-09-13 DIAGNOSIS — I429 Cardiomyopathy, unspecified: Secondary | ICD-10-CM

## 2022-09-13 DIAGNOSIS — I4819 Other persistent atrial fibrillation: Secondary | ICD-10-CM | POA: Diagnosis not present

## 2022-09-13 MED ORDER — LOSARTAN POTASSIUM-HCTZ 50-12.5 MG PO TABS: 1.0000 | ORAL_TABLET | Freq: Every day | ORAL | 3 refills | Status: DC

## 2022-09-13 NOTE — Patient Instructions (Addendum)
Medication Instructions:  Please start Losartan-HCTZ 50-12.5 mg once daily. Discontinue your Losartan. Continue all other medications as listed.  *If you need a refill on your cardiac medications before your next appointment, please call your pharmacy*   Lab Work: Please have blood work today (Fe, TIBC, Ferritin, CBC) And in 2 weeks have BMP  If you have labs (blood work) drawn today and your tests are completely normal, you will receive your results only by: [place] (if you have MyChart) OR A paper copy in the mail If you have any lab test that is abnormal or we need to change your treatment, we will call you to review the results.  Follow-Up: At [facility], you and your health needs are our priority.  As part of our continuing mission to provide you with exceptional heart care, we have created designated Provider Care Teams.  These Care Teams include your primary Cardiologist (physician) and Advanced Practice Providers (APPs -  Physician Assistants and Nurse Practitioners) who all work together to provide you with the care you need, when you need it.  We recommend signing up for the patient portal called "MyChart".  Sign up information is provided on this After Visit Summary.  MyChart is used to connect with patients for Virtual Visits (Telemedicine).  Patients are able to view lab/test results, encounter notes, upcoming appointments, etc.  Non-urgent messages can be sent to your provider as well.   To learn more about what you can do with MyChart, go to [email or web address].    Your next appointment:   1 year(s)  The format for your next appointment:   In Person  Provider:   Dr Virl Axe      Important Information About Sugar

## 2022-09-13 NOTE — Addendum Note (Signed)
Addended by: Janan Halter F on: 09/13/2022 05:06 PM   Modules accepted: Orders

## 2022-09-13 NOTE — Progress Notes (Signed)
Patient Care Team: Mosie Lukes, MD as PCP - General (Family Medicine) Deboraha Sprang, MD as PCP - Electrophysiology (Cardiology) Deboraha Sprang, MD as PCP - Cardiology (Cardiology) Gatha Mayer, MD as Consulting Physician (Gastroenterology) Garvin Fila, MD as Consulting Physician (Neurology)   HPI  Tyler Sims is a 66 y.o. male Seen in followup for nonischemic cardiomyopathy with which he presented with a stroke and atrial fibrillation.  It was initially hoped that this was secondary to the rate. However, despite restoration of sinus rhythm, he has had persistent left ventricular dysfunction.   He is on Coumadin anticoagulation;    bleeding  The patient denies chest pain, nocturnal dyspnea, orthopnea or peripheral edema.  There have been no palpitations, lightheadedness or syncope.  Complains of some effort intolerance.  Mostly manifesting as dyspnea.Marland Kitchen    DATE TEST EF   1/1 LHC  CAs normal  1/12 Echo 20-25 %   1/14 Echo 40-45 %   8/17 Echo 45-50 %   12/21 Echo 50-55 %    Date Cr K Hgb  11/21 1.01 4.6 14.1 (8/21)  8/22 1.08 4.5 13.1  8/23 1.07 4.4 12.4       Past Medical History:  Diagnosis Date   Abnormal transaminases    Acid reflux    Allergy    Bradycardia    nocturnal   Colon polyps 02/07/2012   Dr. Silvano Rusk   Elevated sed rate 07/06/2017   HTN (hypertension) 06/02/2014   Low back pain 06/15/2015   Measles ([place])    Mitral regurgitation    Mumps    Neuropathy 01/10/2016   NICM (nonischemic cardiomyopathy) ([place])    Persistent atrial fibrillation ([place]) 08/24/2010   Preventative health care 12/04/2013   Colonsocopy May '13 - adenomatous polyps  Immunizations Tdap Jan '14    Stroke [facility]) 71/10/4578   Systolic CHF ([place])    Urinary hesitancy 12/21/2014   Varicella    Ventricular tachycardia, non-sustained     Past Surgical History:  Procedure Laterality Date   CARDIAC CATHETERIZATION     CARDIOVERSION  08/15/2012   Procedure: CARDIOVERSION;   Surgeon: Deboraha Sprang, MD;  Location: [place];  Service: Cardiovascular;  Laterality: N/A;   CARDIOVERSION N/A 05/28/2018   Procedure: CARDIOVERSION;  Surgeon: Fay Records, MD;  Location: [facility];  Service: Cardiovascular;  Laterality: N/A;   COLONOSCOPY W/ BIOPSIES  02/07/2012   Dr. Silvano Rusk    Current Outpatient Medications  Medication Sig Dispense Refill   atorvastatin (LIPITOR) 20 MG tablet Take 1 tablet (20 mg total) by mouth daily. 90 tablet 3   cholecalciferol (VITAMIN D) 1000 units tablet Take 1 tablet (1,000 Units total) by mouth daily. (Patient taking differently: Take 1,000 Units by mouth every other day.)     COD LIVER OIL PO Take 1 capsule by mouth daily.      Coenzyme Q10 (CO Q-10) 200 MG CAPS Take 200 mg by mouth daily.      diphenhydramine-acetaminophen (TYLENOL PM) 25-500 MG TABS tablet Take 2 tablets by mouth at bedtime as needed (pain / sleep).     folic acid (FOLVITE) 1 MG tablet TAKE 1 TABLET BY MOUTH EVERY DAY 90 tablet 1   gabapentin (NEURONTIN) 800 MG tablet Take 1 tablet (800 mg total) by mouth 3 (three) times daily. 270 tablet 2   losartan (COZAAR) 50 MG tablet Take 1 tablet (50 mg total) by mouth daily. 90 tablet 3   metoprolol succinate (TOPROL-XL) 25 MG  24 hr tablet Take 1 tablet (25 mg total) by mouth daily. 90 tablet 3   sildenafil (VIAGRA) 50 MG tablet Take 0.5-2 tablets (25-100 mg total) by mouth daily as needed for erectile dysfunction. 30 tablet 1   spironolactone (ALDACTONE) 25 MG tablet Take 1 tablet (25 mg total) by mouth daily. 90 tablet 3   thiamine (VITAMIN B-1) 100 MG tablet Take 1 tablet (100 mg total) by mouth daily. 30 tablet 5   warfarin (COUMADIN) 5 MG tablet TAKE 1 TABLET DAILY EXCEPT ONE AND ONE-HALF TABLETS ON MONDAY, WEDNESDAY, AND FRIDAY OR AS DIRECTED BY ANTICOAGULATION CLINIC 120 tablet 3   No current facility-administered medications for this visit.    Allergies  Allergen Reactions   Lisinopril Rash   Zocor  [Simvastatin] Rash    Review of Systems negative except from HPI and PMH  Physical Exam BP (!) 148/76   Pulse 66   Ht '6\' 3"'$  (1.905 m)   Wt 218 lb 9.6 oz (99.2 kg)   SpO2 96%   BMI 27.32 kg/m   Well developed and nourished in no acute distress HENT normal Neck supple with JVP-  flat   Clear Regular rate and rhythm, no murmurs or gallops Abd-soft with active BS No Clubbing cyanosis edema Skin-warm and dry A & Oriented  Grossly normal sensory and motor function  ECG sinus at 66 Will 16/08/40 Otherwise normal  Assessment and  Plan Persistent  atrial fibrillation  Sinus bradycardia  Hypertension  Anemia  Nonischemic cardiomyopathy  Blood pressure remains borderline elevated.  Numbers at home are high 130s typically.  We will add low-dose hydrochlorothiazide changes losartan from 50--50/12.5.  Will continue the Aldactone and the low-dose metoprolol.  About 2 weeks following the introduction of the diuretic we will check a metabolic profile for potassium  Bradycardia precludes up titration of his beta-blocker.  No significant atrial fibrillation of which she is aware.  Continue warfarin.  His last hemoglobin had drifted down into the mid 12's.  We will recheck it today.

## 2022-09-14 LAB — CBC
Hematocrit: 37.8 % (ref 37.5–51.0)
Hemoglobin: 12.7 g/dL — ABNORMAL LOW (ref 13.0–17.7)
MCH: 32.6 pg (ref 26.6–33.0)
MCHC: 33.6 g/dL (ref 31.5–35.7)
MCV: 97 fL (ref 79–97)
Platelets: 242 10*3/uL (ref 150–450)
RBC: 3.9 x10E6/uL — ABNORMAL LOW (ref 4.14–5.80)
RDW: 12.7 % (ref 11.6–15.4)
WBC: 6 10*3/uL (ref 3.4–10.8)

## 2022-09-14 LAB — IRON AND TIBC
Iron Saturation: 33 % (ref 15–55)
Iron: 87 ug/dL (ref 38–169)
Total Iron Binding Capacity: 264 ug/dL (ref 250–450)
UIBC: 177 ug/dL (ref 111–343)

## 2022-09-14 LAB — FERRITIN: Ferritin: 287 ng/mL (ref 30–400)

## 2022-09-22 ENCOUNTER — Telehealth: Payer: Self-pay

## 2022-09-22 NOTE — Telephone Encounter (Signed)
Results reviewed with patient along with Dr Olin Pia recommendations. Patient verbalized understanding and had no questions.

## 2022-09-22 NOTE — Telephone Encounter (Signed)
-----   Message from Tyler Sprang, MD sent at 09/19/2022 10:06 AM EST ----- Please Inform Patient  Labs are normal x mild anemia but no evidence of iron defi)__this is good BUT given that this has been progressive his PCP should think about hematology referral     Thanks

## 2022-09-28 ENCOUNTER — Ambulatory Visit: Payer: Medicare HMO | Attending: Internal Medicine

## 2022-09-28 DIAGNOSIS — I4819 Other persistent atrial fibrillation: Secondary | ICD-10-CM | POA: Diagnosis not present

## 2022-09-28 DIAGNOSIS — Z79899 Other long term (current) drug therapy: Secondary | ICD-10-CM | POA: Diagnosis not present

## 2022-09-28 LAB — BASIC METABOLIC PANEL
BUN/Creatinine Ratio: 17 (ref 10–24)
BUN: 20 mg/dL (ref 8–27)
CO2: 24 mmol/L (ref 20–29)
Calcium: 10.2 mg/dL (ref 8.6–10.2)
Chloride: 101 mmol/L (ref 96–106)
Creatinine, Ser: 1.2 mg/dL (ref 0.76–1.27)
Glucose: 101 mg/dL — ABNORMAL HIGH (ref 70–99)
Potassium: 4.5 mmol/L (ref 3.5–5.2)
Sodium: 137 mmol/L (ref 134–144)
eGFR: 67 mL/min/{1.73_m2} (ref >59)

## 2022-10-11 ENCOUNTER — Other Ambulatory Visit: Payer: Self-pay | Admitting: Family Medicine

## 2022-10-18 ENCOUNTER — Ambulatory Visit (INDEPENDENT_AMBULATORY_CARE_PROVIDER_SITE_OTHER): Payer: Medicare HMO

## 2022-10-18 DIAGNOSIS — Z7901 Long term (current) use of anticoagulants: Secondary | ICD-10-CM

## 2022-10-18 LAB — POCT INR: INR: 2.6 (ref 2.0–3.0)

## 2022-10-18 NOTE — Progress Notes (Signed)
Continue to take 1 tablet daily except 1 1/2 tablets on Mondays, Wednesdays, and Fridays.  Recheck in 6 weeks.

## 2022-10-18 NOTE — Patient Instructions (Addendum)
Pre visit review using our clinic review tool, if applicable. No additional management support is needed unless otherwise documented below in the visit note.  Continue to take 1 tablet daily except 1 1/2 tablets on Mondays, Wednesdays, and Fridays.  Recheck in 6 weeks.

## 2022-11-29 ENCOUNTER — Ambulatory Visit (INDEPENDENT_AMBULATORY_CARE_PROVIDER_SITE_OTHER): Payer: Medicare HMO

## 2022-11-29 DIAGNOSIS — Z7901 Long term (current) use of anticoagulants: Secondary | ICD-10-CM

## 2022-11-29 LAB — POCT INR: INR: 2.3 (ref 2.0–3.0)

## 2022-11-29 NOTE — Progress Notes (Cosign Needed)
Continue to take 1 tablet daily except 1 1/2 tablets on Mondays, Wednesdays, and Fridays.  Recheck in 6 weeks. 

## 2022-11-29 NOTE — Patient Instructions (Addendum)
Pre visit review using our clinic review tool, if applicable. No additional management support is needed unless otherwise documented below in the visit note.  Continue to take 1 tablet daily except 1 1/2 tablets on Mondays, Wednesdays, and Fridays.  Recheck in 6 weeks.   

## 2022-11-30 ENCOUNTER — Other Ambulatory Visit: Payer: Self-pay | Admitting: Family Medicine

## 2022-11-30 NOTE — Telephone Encounter (Signed)
Pt is compliant with warfarin management and PCP apts. ?Sent in refill.  ?

## 2023-01-10 ENCOUNTER — Ambulatory Visit (INDEPENDENT_AMBULATORY_CARE_PROVIDER_SITE_OTHER): Payer: Medicare HMO

## 2023-01-10 DIAGNOSIS — Z7901 Long term (current) use of anticoagulants: Secondary | ICD-10-CM | POA: Diagnosis not present

## 2023-01-10 LAB — POCT INR: INR: 1.8 — AB (ref 2.0–3.0)

## 2023-01-10 NOTE — Progress Notes (Signed)
Increase dose today to take 1 1/2 tablets and the continue to take 1 tablet daily except 1 1/2 tablets on Mondays, Wednesdays, and Fridays.  Recheck in 4 weeks.

## 2023-01-10 NOTE — Patient Instructions (Addendum)
Pre visit review using our clinic review tool, if applicable. No additional management support is needed unless otherwise documented below in the visit note.  Increase dose today to take 1 1/2 tablets and the continue to take 1 tablet daily except 1 1/2 tablets on Mondays, Wednesdays, and Fridays.  Recheck in 4 weeks.

## 2023-01-30 ENCOUNTER — Other Ambulatory Visit: Payer: Self-pay | Admitting: Family Medicine

## 2023-02-07 ENCOUNTER — Ambulatory Visit (INDEPENDENT_AMBULATORY_CARE_PROVIDER_SITE_OTHER): Payer: Medicare HMO

## 2023-02-07 DIAGNOSIS — Z7901 Long term (current) use of anticoagulants: Secondary | ICD-10-CM

## 2023-02-07 LAB — POCT INR: INR: 2 (ref 2.0–3.0)

## 2023-02-07 NOTE — Patient Instructions (Addendum)
Pre visit review using our clinic review tool, if applicable. No additional management support is needed unless otherwise documented below in the visit note.  Continue to take 1 tablet daily except 1 1/2 tablets on Mondays, Wednesdays, and Fridays.  Recheck in 6 weeks.   

## 2023-02-07 NOTE — Progress Notes (Signed)
Continue to take 1 tablet daily except 1 1/2 tablets on Mondays, Wednesdays, and Fridays.  Recheck in 6 weeks. 

## 2023-02-10 ENCOUNTER — Other Ambulatory Visit: Payer: Self-pay | Admitting: Family Medicine

## 2023-02-21 DIAGNOSIS — H52203 Unspecified astigmatism, bilateral: Secondary | ICD-10-CM | POA: Diagnosis not present

## 2023-02-21 DIAGNOSIS — H524 Presbyopia: Secondary | ICD-10-CM | POA: Diagnosis not present

## 2023-02-21 LAB — HM DIABETES EYE EXAM

## 2023-02-25 ENCOUNTER — Other Ambulatory Visit: Payer: Self-pay | Admitting: Family Medicine

## 2023-03-05 ENCOUNTER — Other Ambulatory Visit: Payer: Self-pay | Admitting: Family Medicine

## 2023-03-21 ENCOUNTER — Ambulatory Visit (INDEPENDENT_AMBULATORY_CARE_PROVIDER_SITE_OTHER): Payer: Medicare HMO

## 2023-03-21 DIAGNOSIS — Z7901 Long term (current) use of anticoagulants: Secondary | ICD-10-CM | POA: Diagnosis not present

## 2023-03-21 LAB — POCT INR: INR: 2.4 (ref 2.0–3.0)

## 2023-03-21 NOTE — Patient Instructions (Addendum)
Pre visit review using our clinic review tool, if applicable. No additional management support is needed unless otherwise documented below in the visit note.  Continue to take 1 tablet daily except 1 1/2 tablets on Mondays, Wednesdays, and Fridays.  Recheck in 6 weeks.   

## 2023-03-21 NOTE — Progress Notes (Signed)
Continue to take 1 tablet daily except 1 1/2 tablets on Mondays, Wednesdays, and Fridays.  Recheck in 6 weeks. 

## 2023-04-19 ENCOUNTER — Encounter (INDEPENDENT_AMBULATORY_CARE_PROVIDER_SITE_OTHER): Payer: Self-pay

## 2023-05-01 DIAGNOSIS — H52209 Unspecified astigmatism, unspecified eye: Secondary | ICD-10-CM | POA: Diagnosis not present

## 2023-05-01 DIAGNOSIS — H524 Presbyopia: Secondary | ICD-10-CM | POA: Diagnosis not present

## 2023-05-01 DIAGNOSIS — H5203 Hypermetropia, bilateral: Secondary | ICD-10-CM | POA: Diagnosis not present

## 2023-05-02 ENCOUNTER — Ambulatory Visit (INDEPENDENT_AMBULATORY_CARE_PROVIDER_SITE_OTHER): Payer: Medicare HMO

## 2023-05-02 DIAGNOSIS — Z7901 Long term (current) use of anticoagulants: Secondary | ICD-10-CM | POA: Diagnosis not present

## 2023-05-02 LAB — POCT INR: INR: 2.2 (ref 2.0–3.0)

## 2023-05-02 NOTE — Progress Notes (Signed)
Continue to take 1 tablet daily except 1 1/2 tablets on Mondays, Wednesdays, and Fridays.  Recheck in 6 weeks. 

## 2023-05-02 NOTE — Patient Instructions (Addendum)
Pre visit review using our clinic review tool, if applicable. No additional management support is needed unless otherwise documented below in the visit note.  Continue to take 1 tablet daily except 1 1/2 tablets on Mondays, Wednesdays, and Fridays.  Recheck in 6 weeks.   

## 2023-05-04 ENCOUNTER — Encounter (INDEPENDENT_AMBULATORY_CARE_PROVIDER_SITE_OTHER): Payer: Self-pay

## 2023-05-22 NOTE — Assessment & Plan Note (Signed)
Patient encouraged to maintain heart healthy diet, regular exercise, adequate sleep. Consider daily probiotics. Take medications as prescribed. Labs ordered and reviewed. Colonoscopy May 2022 repeat in 10 years. Given and reviewed copy of ACP documents from [employer] and encouraged to complete and return

## 2023-05-22 NOTE — Assessment & Plan Note (Signed)
Encourage heart healthy diet such as MIND or DASH diet, increase exercise, avoid trans fats, simple carbohydrates and processed foods, consider a krill or fish or flaxseed oil cap daily. Tolerating Atorvastatin 

## 2023-05-22 NOTE — Assessment & Plan Note (Signed)
Following with cardiology and stable

## 2023-05-22 NOTE — Assessment & Plan Note (Signed)
Supplement and monitor 

## 2023-05-22 NOTE — Assessment & Plan Note (Signed)
Continues to have daily symptoms but still active.

## 2023-05-22 NOTE — Assessment & Plan Note (Signed)
Rate controlled, tolerating Warfarin

## 2023-05-22 NOTE — Assessment & Plan Note (Signed)
Well controlled, no changes to meds. Encouraged heart healthy diet such as the DASH diet and exercise as tolerated.  °

## 2023-05-23 ENCOUNTER — Encounter: Payer: Self-pay | Admitting: Family Medicine

## 2023-05-23 ENCOUNTER — Ambulatory Visit (INDEPENDENT_AMBULATORY_CARE_PROVIDER_SITE_OTHER): Payer: Medicare HMO | Admitting: Family Medicine

## 2023-05-23 VITALS — BP 126/80 | HR 54 | Temp 97.9°F | Resp 16 | Ht 75.0 in | Wt 222.0 lb

## 2023-05-23 DIAGNOSIS — Z0001 Encounter for general adult medical examination with abnormal findings: Secondary | ICD-10-CM

## 2023-05-23 DIAGNOSIS — G609 Hereditary and idiopathic neuropathy, unspecified: Secondary | ICD-10-CM

## 2023-05-23 DIAGNOSIS — G4700 Insomnia, unspecified: Secondary | ICD-10-CM | POA: Insufficient documentation

## 2023-05-23 DIAGNOSIS — R351 Nocturia: Secondary | ICD-10-CM | POA: Diagnosis not present

## 2023-05-23 DIAGNOSIS — I10 Essential (primary) hypertension: Secondary | ICD-10-CM

## 2023-05-23 DIAGNOSIS — E559 Vitamin D deficiency, unspecified: Secondary | ICD-10-CM | POA: Diagnosis not present

## 2023-05-23 DIAGNOSIS — Z0000 Encounter for general adult medical examination without abnormal findings: Secondary | ICD-10-CM

## 2023-05-23 DIAGNOSIS — I429 Cardiomyopathy, unspecified: Secondary | ICD-10-CM

## 2023-05-23 DIAGNOSIS — Z23 Encounter for immunization: Secondary | ICD-10-CM | POA: Diagnosis not present

## 2023-05-23 DIAGNOSIS — E782 Mixed hyperlipidemia: Secondary | ICD-10-CM

## 2023-05-23 DIAGNOSIS — I480 Paroxysmal atrial fibrillation: Secondary | ICD-10-CM

## 2023-05-23 DIAGNOSIS — R001 Bradycardia, unspecified: Secondary | ICD-10-CM

## 2023-05-23 LAB — COMPREHENSIVE METABOLIC PANEL
ALT: 18 U/L (ref 0–53)
AST: 20 U/L (ref 0–37)
Albumin: 4.2 g/dL (ref 3.5–5.2)
Alkaline Phosphatase: 86 U/L (ref 39–117)
BUN: 20 mg/dL (ref 6–23)
CO2: 30 meq/L (ref 19–32)
Calcium: 10.5 mg/dL (ref 8.4–10.5)
Chloride: 100 meq/L (ref 96–112)
Creatinine, Ser: 1.19 mg/dL (ref 0.40–1.50)
GFR: 63.99 mL/min (ref >60.00)
Glucose, Bld: 94 mg/dL (ref 70–99)
Potassium: 4.9 meq/L (ref 3.5–5.1)
Sodium: 136 meq/L (ref 135–145)
Total Bilirubin: 1.1 mg/dL (ref 0.2–1.2)
Total Protein: 7.9 g/dL (ref 6.0–8.3)

## 2023-05-23 LAB — LIPID PANEL
Cholesterol: 134 mg/dL (ref 0–200)
HDL: 51.2 mg/dL (ref >39.00)
LDL Cholesterol: 66 mg/dL (ref 0–99)
NonHDL: 82.32
Total CHOL/HDL Ratio: 3
Triglycerides: 82 mg/dL (ref 0.0–149.0)
VLDL: 16.4 mg/dL (ref 0.0–40.0)

## 2023-05-23 NOTE — Patient Instructions (Addendum)
Magnesium Glycinate 200-400 mg at bedtime  L Tryptophan capsules  4000, 8000 steps  60-80 ounces of fluids daily  Tetanus at pharmacy  Covid in fall    Preventive Care 86-66 Years Old, Male Preventive care refers to lifestyle choices and visits with your health care provider that can promote health and wellness. Preventive care visits are also called wellness exams. What can I expect for my preventive care visit? Counseling During your preventive care visit, your health care provider may ask about your: Medical history, including: Past medical problems. Family medical history. Current health, including: Emotional well-being. Home life and relationship well-being. Sexual activity. Lifestyle, including: Alcohol, nicotine or tobacco, and drug use. Access to firearms. Diet, exercise, and sleep habits. Safety issues such as seatbelt and bike helmet use. Sunscreen use. Work and work Astronomer. Physical exam Your health care provider will check your: Height and weight. These may be used to calculate your BMI (body mass index). BMI is a measurement that tells if you are at a healthy weight. Waist circumference. This measures the distance around your waistline. This measurement also tells if you are at a healthy weight and may help predict your risk of certain diseases, such as type 2 diabetes and high blood pressure. Heart rate and blood pressure. Body temperature. Skin for abnormal spots. What immunizations do I need?  Vaccines are usually given at various ages, according to a schedule. Your health care provider will recommend vaccines for you based on your age, medical history, and lifestyle or other factors, such as travel or where you work. What tests do I need? Screening Your health care provider may recommend screening tests for certain conditions. This may include: Lipid and cholesterol levels. Diabetes screening. This is done by checking your blood sugar (glucose)  after you have not eaten for a while (fasting). Hepatitis B test. Hepatitis C test. HIV (human immunodeficiency virus) test. STI (sexually transmitted infection) testing, if you are at risk. Lung cancer screening. Prostate cancer screening. Colorectal cancer screening. Talk with your health care provider about your test results, treatment options, and if necessary, the need for more tests. Follow these instructions at home: Eating and drinking  Eat a diet that includes fresh fruits and vegetables, whole grains, lean protein, and low-fat dairy products. Take vitamin and mineral supplements as recommended by your health care provider. Do not drink alcohol if your health care provider tells you not to drink. If you drink alcohol: Limit how much you have to 0-2 drinks a day. Know how much alcohol is in your drink. In the [place] one drink equals one 12 oz bottle of beer (355 mL), one 5 oz glass of wine (148 mL), or one 1 oz glass of hard liquor (44 mL). Lifestyle Brush your teeth every morning and night with fluoride toothpaste. Floss one time each day. Exercise for at least 30 minutes 5 or more days each week. Do not use any products that contain nicotine or tobacco. These products include cigarettes, chewing tobacco, and vaping devices, such as e-cigarettes. If you need help quitting, ask your health care provider. Do not use drugs. If you are sexually active, practice safe sex. Use a condom or other form of protection to prevent STIs. Take aspirin only as told by your health care provider. Make sure that you understand how much to take and what form to take. Work with your health care provider to find out whether it is safe and beneficial for you to take aspirin daily. Find healthy  ways to manage stress, such as: Meditation, yoga, or listening to music. Journaling. Talking to a trusted person. Spending time with friends and family. Minimize exposure to UV radiation to reduce your risk of  skin cancer. Safety Always wear your seat belt while driving or riding in a vehicle. Do not drive: If you have been drinking alcohol. Do not ride with someone who has been drinking. When you are tired or distracted. While texting. If you have been using any mind-altering substances or drugs. Wear a helmet and other protective equipment during sports activities. If you have firearms in your house, make sure you follow all gun safety procedures. What's next? Go to your health care provider once a year for an annual wellness visit. Ask your health care provider how often you should have your eyes and teeth checked. Stay up to date on all vaccines. This information is not intended to replace advice given to you by your health care provider. Make sure you discuss any questions you have with your health care provider. Document Revised: 03/03/2021 Document Reviewed: 03/03/2021 Elsevier Patient Education  2024 [employer].

## 2023-05-23 NOTE — Assessment & Plan Note (Signed)
Asymptomatic doing well

## 2023-05-23 NOTE — Assessment & Plan Note (Signed)
Falls asleep with TV on then sleeps about 5 hours and then wakes up but goes back to sleep. Encouraged good sleep hygiene such as dark, quiet room. No blue/green glowing lights such as computer screens in bedroom. No alcohol or stimulants in evening. Cut down on caffeine as able. Regular exercise is helpful but not just prior to bed time.

## 2023-05-24 ENCOUNTER — Other Ambulatory Visit (INDEPENDENT_AMBULATORY_CARE_PROVIDER_SITE_OTHER): Payer: Medicare HMO

## 2023-05-24 ENCOUNTER — Other Ambulatory Visit: Payer: Self-pay

## 2023-05-24 DIAGNOSIS — D649 Anemia, unspecified: Secondary | ICD-10-CM

## 2023-05-24 DIAGNOSIS — I10 Essential (primary) hypertension: Secondary | ICD-10-CM

## 2023-05-24 LAB — CBC WITH DIFFERENTIAL/PLATELET
Basophils Absolute: 0 10*3/uL (ref 0.0–0.1)
Basophils Relative: 0.7 % (ref 0.0–3.0)
Eosinophils Absolute: 0.1 10*3/uL (ref 0.0–0.7)
Eosinophils Relative: 1.4 % (ref 0.0–5.0)
HCT: 36.9 % — ABNORMAL LOW (ref 39.0–52.0)
Hemoglobin: 12.3 g/dL — ABNORMAL LOW (ref 13.0–17.0)
Lymphocytes Relative: 25.6 % (ref 12.0–46.0)
Lymphs Abs: 1.5 10*3/uL (ref 0.7–4.0)
MCHC: 33.4 g/dL (ref 30.0–36.0)
MCV: 95.6 fl (ref 78.0–100.0)
Monocytes Absolute: 0.7 10*3/uL (ref 0.1–1.0)
Monocytes Relative: 12.7 % — ABNORMAL HIGH (ref 3.0–12.0)
Neutro Abs: 3.4 10*3/uL (ref 1.4–7.7)
Neutrophils Relative %: 59.6 % (ref 43.0–77.0)
Platelets: 204 10*3/uL (ref 150.0–400.0)
RBC: 3.86 Mil/uL — ABNORMAL LOW (ref 4.22–5.81)
RDW: 13.8 % (ref 11.5–15.5)
WBC: 5.8 10*3/uL (ref 4.0–10.5)

## 2023-05-24 LAB — PSA: PSA: 1.32 ng/mL (ref 0.10–4.00)

## 2023-05-24 LAB — TSH: TSH: 1.93 u[IU]/mL (ref 0.35–5.50)

## 2023-05-24 LAB — VITAMIN D 25 HYDROXY (VIT D DEFICIENCY, FRACTURES): VITD: 54.84 ng/mL (ref 30.00–100.00)

## 2023-05-24 NOTE — Progress Notes (Signed)
Subjective:    Patient ID: Tyler Sims, male    DOB: 08-30-1956, 67 y.o.   MRN: [id number]  Chief Complaint  Patient presents with   Annual Exam    Annual Exam    HPI Discussed the use of AI scribe software for clinical note transcription with the patient, who gave verbal consent to proceed.  History of Present Illness  Patient is a 67 yo male in today for annual preventative exam and for follow up on chronic medical concerns. No recent febrile illness or hospitalizations. Denies CP/palp/SOB/HA/congestion/fevers/GI or GU c/o. Taking meds as prescribed  The patient, with a history of hypertension, neuropathy, and mild anemia, presents with chronic aches and pains, particularly in their feet. They describe the neuropathy as a combination of numbness and pain, which has not worsened but also has not improved. Despite these symptoms, the patient remains active, working a few days a week and engaging in yard work.  The patient also reports disrupted sleep patterns, typically waking up once or twice during the night. They have been using Tylenol PM to aid sleep, but express concern about its long-term effects. Despite these disruptions, the patient does not report any significant impact on their daily activities or overall well-being.  The patient also discusses their hydration habits, stating that they drink a lot of water throughout the day. They recently returned from a trip to [place] and are planning for future travel. They also mention a recent change in their blood pressure medication by their cardiologist due to high readings, and have been taking a vitamin for mild anemia.        Past Medical History:  Diagnosis Date   Abnormal transaminases    Acid reflux    Allergy    Bradycardia    nocturnal   Colon polyps 02/07/2012   Dr. Stan Head   Elevated sed rate 07/06/2017   HTN (hypertension) 06/02/2014   Low back pain 06/15/2015   Measles ([place])    Mitral regurgitation     Mumps    Neuropathy 01/10/2016   NICM (nonischemic cardiomyopathy) (HCC)    Persistent atrial fibrillation (HCC) 08/24/2010   Preventative health care 12/04/2013   Colonsocopy May '13 - adenomatous polyps  Immunizations Tdap Jan '14    Stroke [facility]) 08/24/2010   Systolic CHF (HCC)    Urinary hesitancy 12/21/2014   Varicella    Ventricular tachycardia, non-sustained (HCC)     Past Surgical History:  Procedure Laterality Date   CARDIAC CATHETERIZATION     CARDIOVERSION  08/15/2012   Procedure: CARDIOVERSION;  Surgeon: Duke Salvia, MD;  Location: [facility] ENDOSCOPY;  Service: Cardiovascular;  Laterality: N/A;   CARDIOVERSION N/A 05/28/2018   Procedure: CARDIOVERSION;  Surgeon: Pricilla Riffle, MD;  Location[facility] ENDOSCOPY;  Service: Cardiovascular;  Laterality: N/A;   COLONOSCOPY W/ BIOPSIES  02/07/2012   Dr. Stan Head    Family History  Problem Relation Age of Onset   Alzheimer's disease Mother    Heart disease Father        CAD/MI   Hypertension Father    Mental illness Father        h/o depression requiring hospitalization   Hypertension Sister    Neuropathy Sister    Arthritis Sister    Hyperlipidemia Sister        x 2   Hypertension Sister    Arthritis Sister    Obesity Sister    Lung cancer Brother        x 2,  smokers   Hypertension Brother    Heart disease Brother    Lung cancer Brother    Hypertension Brother    Colon polyps Brother    Hypertension Brother    Intellectual disability Brother    Varicose Veins Daughter    Birth defects Paternal Aunt    Colon cancer Neg Hx    Rectal cancer Neg Hx    Stomach cancer Neg Hx     Social History   Socioeconomic History   Marital status: Married    Spouse name: [name]   Number of children: 2   Years of education: 16   Highest education level: Not on file  Occupational History   Occupation: Production designer, theatre/television/film of Teacher, music: [employer]  Tobacco Use   Smoking status: Never   Smokeless tobacco: Never  Vaping Use    Vaping status: Never Used  Substance and Sexual Activity   Alcohol use: Yes    Alcohol/week: 0.0 standard drinks of alcohol    Comment: occasional wine   Drug use: No   Sexual activity: Yes    Partners: Female    Comment: lives with wife, no dietary restrictions, except avoid dairy, uses Lactaid, works with [employer]  Other Topics Concern   Not on file  Social History Narrative   Patient is married with 2 children. 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   Patient has 16 years of education.   Patient drinks 1-2 cups caffeine daily.   Never smoker occasional wine no drug use   Social Determinants of Corporate investment banker Strain: Not on file  Food Insecurity: Not on file  Transportation Needs: Not on file  Physical Activity: Not on file  Stress: Not on file  Social Connections: Not on file  Intimate Partner Violence: Not on file    Outpatient Medications Prior to Visit  Medication Sig Dispense Refill   atorvastatin (LIPITOR) 20 MG tablet TAKE 1 TABLET BY MOUTH EVERY DAY 90 tablet 1   cholecalciferol (VITAMIN D) 1000 units tablet Take 1 tablet (1,000 Units total) by mouth daily. (Patient taking differently: Take 1,000 Units by mouth every other day.)     COD LIVER OIL PO Take 1 capsule by mouth daily.      Coenzyme Q10 (CO Q-10) 200 MG CAPS Take 200 mg by mouth daily.      diphenhydramine-acetaminophen (TYLENOL PM) 25-500 MG TABS tablet Take 2 tablets by mouth at bedtime as needed (pain / sleep).     folic acid (FOLVITE) 1 MG tablet TAKE 1 TABLET BY MOUTH EVERY DAY 90 tablet 1   gabapentin (NEURONTIN) 800 MG tablet Take 1 tablet (800 mg total) by mouth 3 (three) times daily. 270 tablet 2   losartan-hydrochlorothiazide (HYZAAR) 50-12.5 MG tablet Take 1 tablet by mouth daily. 90 tablet 3   metoprolol  succinate (TOPROL-XL) 25 MG 24 hr tablet TAKE 1 TABLET (25 MG TOTAL) BY MOUTH DAILY. 90 tablet 3   sildenafil (VIAGRA) 50 MG tablet Take 0.5-2 tablets (25-100 mg total) by mouth daily as needed for erectile dysfunction. 30 tablet 1   spironolactone (ALDACTONE) 25 MG tablet Take 1 tablet (25 mg total) by mouth daily. 90 tablet 3   thiamine (VITAMIN B1) 100 MG tablet Take 1 tablet (  100 mg total) by mouth daily. 90 tablet 3   warfarin (COUMADIN) 5 MG tablet TAKE 1 TABLET BY MOUTH DAILY EXCEPT TAKE ONE AND ONE-HALF TABLETS ON MONDAY, WEDNESDAY, AND FRIDAY OR AS DIRECTED BY ANTICOAGULATION CLINIC 120 tablet 3   No facility-administered medications prior to visit.    Allergies  Allergen Reactions   Lisinopril Rash   Zocor [Simvastatin] Rash    Review of Systems  Constitutional:  Negative for chills, fever and malaise/fatigue.  HENT:  Negative for congestion and hearing loss.   Eyes:  Negative for discharge and redness.  Respiratory:  Negative for cough, sputum production and shortness of breath.   Cardiovascular:  Negative for chest pain, palpitations and leg swelling.  Gastrointestinal:  Negative for abdominal pain, blood in stool, constipation, diarrhea, heartburn, nausea and vomiting.  Genitourinary:  Negative for dysuria, frequency, hematuria and urgency.  Musculoskeletal:  Positive for myalgias. Negative for back pain and falls.  Skin:  Negative for rash.  Neurological:  Positive for sensory change. Negative for dizziness, loss of consciousness, weakness and headaches.  Endo/Heme/Allergies:  Negative for environmental allergies. Does not bruise/bleed easily.  Psychiatric/Behavioral:  Negative for depression and suicidal ideas. The patient is not nervous/anxious and does not have insomnia.        Objective:    Physical Exam Vitals reviewed.  Constitutional:      General: He is not in acute distress.    Appearance: Normal appearance. He is not ill-appearing or diaphoretic.  HENT:      Head: Normocephalic and atraumatic.     Right Ear: Tympanic membrane, ear canal and external ear normal. There is no impacted cerumen.     Left Ear: Tympanic membrane, ear canal and external ear normal. There is no impacted cerumen.     Nose: Nose normal. No rhinorrhea.     Mouth/Throat:     Pharynx: Oropharynx is clear.  Eyes:     General: No scleral icterus.    Extraocular Movements: Extraocular movements intact.     Conjunctiva/sclera: Conjunctivae normal.     Pupils: Pupils are equal, round, and reactive to light.  Neck:     Thyroid: No thyroid mass or thyroid tenderness.  Cardiovascular:     Rate and Rhythm: Normal rate and regular rhythm.     Pulses: Normal pulses.     Heart sounds: Normal heart sounds. No murmur heard. Pulmonary:     Effort: Pulmonary effort is normal.     Breath sounds: Normal breath sounds. No wheezing.  Abdominal:     General: Bowel sounds are normal.     Palpations: Abdomen is soft. There is no mass.     Tenderness: There is no guarding.  Musculoskeletal:        General: No swelling. Normal range of motion.     Cervical back: Normal range of motion and neck supple. No rigidity.     Right lower leg: No edema.     Left lower leg: No edema.  Lymphadenopathy:     Cervical: No cervical adenopathy.  Skin:    General: Skin is warm and dry.     Findings: No rash.  Neurological:     General: No focal deficit present.     Mental Status: He is alert and oriented to person, place, and time.     Cranial Nerves: No cranial nerve deficit.     Deep Tendon Reflexes: Reflexes normal.  Psychiatric:        Mood and Affect: Mood normal.  Behavior: Behavior normal.     BP 126/80 (BP Location: Left Arm, Patient Position: Sitting, Cuff Size: Normal)   Pulse (!) 54   Temp 97.9 F (36.6 C) (Oral)   Resp 16   Ht 6\' 3"  (1.905 m)   Wt 222 lb (100.7 kg)   SpO2 95%   BMI 27.75 kg/m  Wt Readings from Last 3 Encounters:  05/23/23 222 lb (100.7 kg)   09/13/22 218 lb 9.6 oz (99.2 kg)  05/16/22 233 lb (105.7 kg)    Diabetic Foot Exam - Simple   No data filed    Lab Results  Component Value Date   WBC 6.0 09/13/2022   HGB 12.7 (L) 09/13/2022   HCT 37.8 09/13/2022   PLT 242 09/13/2022   GLUCOSE 94 05/23/2023   CHOL 134 05/23/2023   TRIG 82.0 05/23/2023   HDL 51.20 05/23/2023   LDLDIRECT 110.4 10/03/2012   LDLCALC 66 05/23/2023   ALT 18 05/23/2023   AST 20 05/23/2023   NA 136 05/23/2023   K 4.9 05/23/2023   CL 100 05/23/2023   CREATININE 1.19 05/23/2023   BUN 20 05/23/2023   CO2 30 05/23/2023   TSH 1.93 05/23/2023   PSA 1.32 05/23/2023   INR 2.2 05/02/2023   HGBA1C 5.2 05/16/2022    Lab Results  Component Value Date   TSH 1.93 05/23/2023   Lab Results  Component Value Date   WBC 6.0 09/13/2022   HGB 12.7 (L) 09/13/2022   HCT 37.8 09/13/2022   MCV 97 09/13/2022   PLT 242 09/13/2022   Lab Results  Component Value Date   NA 136 05/23/2023   K 4.9 05/23/2023   CO2 30 05/23/2023   GLUCOSE 94 05/23/2023   BUN 20 05/23/2023   CREATININE 1.19 05/23/2023   BILITOT 1.1 05/23/2023   ALKPHOS 86 05/23/2023   AST 20 05/23/2023   ALT 18 05/23/2023   PROT 7.9 05/23/2023   ALBUMIN 4.2 05/23/2023   CALCIUM 10.5 05/23/2023   EGFR 67 09/28/2022   GFR 63.99 05/23/2023   Lab Results  Component Value Date   CHOL 134 05/23/2023   Lab Results  Component Value Date   HDL 51.20 05/23/2023   Lab Results  Component Value Date   LDLCALC 66 05/23/2023   Lab Results  Component Value Date   TRIG 82.0 05/23/2023   Lab Results  Component Value Date   CHOLHDL 3 05/23/2023   Lab Results  Component Value Date   HGBA1C 5.2 05/16/2022       Assessment & Plan:  Paroxysmal atrial fibrillation (HCC) Assessment & Plan: Rate controlled, tolerating Warfarin   Hereditary and idiopathic peripheral neuropathy Assessment & Plan: Continues to have daily symptoms but still active.    Primary hypertension Assessment  & Plan: Well controlled, no changes to meds. Encouraged heart healthy diet such as the DASH diet and exercise as tolerated.   Orders: -     Comprehensive metabolic panel -     TSH -     CBC with Differential/Platelet; Future  Mixed hyperlipidemia Assessment & Plan: Encourage heart healthy diet such as MIND or DASH diet, increase exercise, avoid trans fats, simple carbohydrates and processed foods, consider a krill or fish or flaxseed oil cap daily. Tolerating Atorvastatin  Orders: -     Lipid panel  Preventative health care Assessment & Plan: Patient encouraged to maintain heart healthy diet, regular exercise, adequate sleep. Consider daily probiotics. Take medications as prescribed. Labs ordered and reviewed. Colonoscopy May  2022 repeat in 10 years. Given and reviewed copy of ACP documents from [employer] and encouraged to complete and return   Orders: -     PSA  Vitamin D deficiency Assessment & Plan: Supplement and monitor  Orders: -     VITAMIN D 25 Hydroxy (Vit-D Deficiency, Fractures)  Secondary cardiomyopathy (HCC) Assessment & Plan: Following with cardiology and stable   Sinus bradycardia Assessment & Plan: Asymptomatic doing well   Insomnia, unspecified type Assessment & Plan: Falls asleep with TV on then sleeps about 5 hours and then wakes up but goes back to sleep. Encouraged good sleep hygiene such as dark, quiet room. No blue/green glowing lights such as computer screens in bedroom. No alcohol or stimulants in evening. Cut down on caffeine as able. Regular exercise is helpful but not just prior to bed time.     Nocturia -     PSA  Need for influenza vaccination -     Flu Vaccine Trivalent High Dose (Fluad)  Need for pneumococcal 20-valent conjugate vaccination -     Pneumococcal conjugate vaccine 20-valent    Assessment and Plan    Peripheral Neuropathy Chronic numbness and pain in feet, not worsening but not improving. Discussed the  importance of slowing progression. -Continue current management strategies.  Insomnia Difficulty maintaining sleep, using TV and Tylenol PM to aid sleep. Discussed potential long-term risks of regular antihistamine use and suggested alternatives. -Consider reducing Tylenol PM use to intermittent. -Try natural alternatives such as magnesium glycinate and L Tryptophan. -Continue good sleep hygiene practices.  Hypertension Blood pressure well-controlled after medication adjustment by cardiologist. -Continue Losartan HCT. Anemia Mild anemia noted last year, patient started taking multivitamin with iron. -Check blood levels to assess current status of anemia.  General Health Maintenance -Administer Prevnar 20 and high-dose flu vaccine today. -Check PSA for prostate health. -Plan for tetanus vaccine in the near future. -Encourage maintaining physical activity, aiming for 4000 steps or more per day. -Advise on maintaining hydration, aiming for 60-80 ounces of non-caffeinated, non-alcoholic fluids per day, spread throughout the day. -Follow-up in one year, unless changes in health status occur.         Danise Edge, MD

## 2023-05-24 NOTE — Progress Notes (Signed)
Pt here for redraw, no charge. ?

## 2023-05-25 ENCOUNTER — Encounter: Payer: Self-pay | Admitting: Family Medicine

## 2023-05-25 ENCOUNTER — Other Ambulatory Visit (INDEPENDENT_AMBULATORY_CARE_PROVIDER_SITE_OTHER): Payer: Medicare HMO

## 2023-05-25 DIAGNOSIS — D649 Anemia, unspecified: Secondary | ICD-10-CM | POA: Diagnosis not present

## 2023-05-25 LAB — VITAMIN B12: Vitamin B-12: 730 pg/mL (ref 211–911)

## 2023-06-01 ENCOUNTER — Other Ambulatory Visit: Payer: Self-pay | Admitting: Internal Medicine

## 2023-06-01 ENCOUNTER — Other Ambulatory Visit: Payer: Self-pay | Admitting: Family Medicine

## 2023-06-01 DIAGNOSIS — G629 Polyneuropathy, unspecified: Secondary | ICD-10-CM

## 2023-06-02 ENCOUNTER — Encounter: Payer: Self-pay | Admitting: Family Medicine

## 2023-06-12 ENCOUNTER — Telehealth: Payer: Self-pay

## 2023-06-12 NOTE — Telephone Encounter (Signed)
Pt has apt with coumadin clinic tomorrow. Need to RS apt for a later date due to coumadin clinic nurse not in the office.  LVM

## 2023-06-13 ENCOUNTER — Ambulatory Visit: Payer: Medicare HMO

## 2023-06-20 ENCOUNTER — Ambulatory Visit (INDEPENDENT_AMBULATORY_CARE_PROVIDER_SITE_OTHER): Payer: Medicare HMO

## 2023-06-20 DIAGNOSIS — Z7901 Long term (current) use of anticoagulants: Secondary | ICD-10-CM

## 2023-06-20 LAB — POCT INR: INR: 2.3 (ref 2.0–3.0)

## 2023-06-20 NOTE — Patient Instructions (Addendum)
Pre visit review using our clinic review tool, if applicable. No additional management support is needed unless otherwise documented below in the visit note.  Continue to take 1 tablet daily except 1 1/2 tablets on Mondays, Wednesdays, and Fridays.  Recheck in 6 weeks.   

## 2023-06-20 NOTE — Progress Notes (Signed)
Continue to take 1 tablet daily except 1 1/2 tablets on Mondays, Wednesdays, and Fridays.  Recheck in 6 weeks. 

## 2023-06-26 ENCOUNTER — Encounter: Payer: Self-pay | Admitting: Family Medicine

## 2023-06-26 NOTE — Telephone Encounter (Signed)
Chart has been updated.

## 2023-08-01 ENCOUNTER — Ambulatory Visit (INDEPENDENT_AMBULATORY_CARE_PROVIDER_SITE_OTHER): Payer: Medicare HMO

## 2023-08-01 DIAGNOSIS — Z7901 Long term (current) use of anticoagulants: Secondary | ICD-10-CM

## 2023-08-01 LAB — POCT INR: INR: 2.2 (ref 2.0–3.0)

## 2023-08-01 NOTE — Patient Instructions (Addendum)
Pre visit review using our clinic review tool, if applicable. No additional management support is needed unless otherwise documented below in the visit note.  Continue to take 1 tablet daily except 1 1/2 tablets on Mondays, Wednesdays, and Fridays.  Recheck in 8 weeks.

## 2023-08-01 NOTE — Progress Notes (Signed)
Continue to take 1 tablet daily except 1 1/2 tablets on Mondays, Wednesdays, and Fridays.  Recheck in 8 weeks per pt request due to holidays.

## 2023-08-03 ENCOUNTER — Ambulatory Visit: Payer: Medicare HMO

## 2023-08-03 VITALS — Ht 75.0 in | Wt 222.0 lb

## 2023-08-03 DIAGNOSIS — Z0000 Encounter for general adult medical examination without abnormal findings: Secondary | ICD-10-CM | POA: Diagnosis not present

## 2023-08-03 NOTE — Progress Notes (Signed)
Subjective:   Tyler Sims is a 67 y.o. male who presents for Medicare Annual/Subsequent preventive examination.  Visit Complete: Virtual I connected with  Tyler Sims on 08/03/23 by a audio enabled telemedicine application and verified that I am speaking with the correct person using two identifiers.  Patient Location: Home  Provider Location: Home Office  I discussed the limitations of evaluation and management by telemedicine. The patient expressed understanding and agreed to proceed.  Vital Signs: Because this visit was a virtual/telehealth visit, some criteria may be missing or patient reported. Any vitals not documented were not able to be obtained and vitals that have been documented are patient reported.    Cardiac Risk Factors include: advanced age (>37men, >55 women);male gender;hypertension     Objective:    Today's Vitals   08/03/23 0855  Weight: 222 lb (100.7 kg)  Height: 6\' 3"  (1.905 m)   Body mass index is 27.75 kg/m.     08/03/2023    9:01 AM 05/28/2018    6:43 AM 06/15/2015    9:11 AM 08/15/2012   10:10 AM  Advanced Directives  Does Patient Have a Medical Advance Directive? Yes No No Patient does not have advance directive  Type of Public librarian Power of [place];Living will     Copy of Healthcare Power of Attorney in Chart? No - copy requested     Would patient like information on creating a medical advance directive?  No - Patient declined Yes - Educational materials given     Current Medications (verified) Outpatient Encounter Medications as of 08/03/2023  Medication Sig   atorvastatin (LIPITOR) 20 MG tablet TAKE 1 TABLET BY MOUTH EVERY DAY   cholecalciferol (VITAMIN D) 1000 units tablet Take 1 tablet (1,000 Units total) by mouth daily. (Patient taking differently: Take 1,000 Units by mouth every other day.)   COD LIVER OIL PO Take 1 capsule by mouth daily.    Coenzyme Q10 (CO Q-10) 200 MG CAPS Take 200 mg by mouth daily.     diphenhydramine-acetaminophen (TYLENOL PM) 25-500 MG TABS tablet Take 2 tablets by mouth at bedtime as needed (pain / sleep).   folic acid (FOLVITE) 1 MG tablet TAKE 1 TABLET BY MOUTH EVERY DAY   gabapentin (NEURONTIN) 800 MG tablet TAKE 1 TABLET BY MOUTH THREE TIMES A DAY   losartan-hydrochlorothiazide (HYZAAR) 50-12.5 MG tablet Take 1 tablet by mouth daily.   metoprolol succinate (TOPROL-XL) 25 MG 24 hr tablet TAKE 1 TABLET (25 MG TOTAL) BY MOUTH DAILY.   sildenafil (VIAGRA) 50 MG tablet Take 0.5-2 tablets (25-100 mg total) by mouth daily as needed for erectile dysfunction.   spironolactone (ALDACTONE) 25 MG tablet TAKE 1 TABLET (25 MG TOTAL) BY MOUTH DAILY.   thiamine (VITAMIN B1) 100 MG tablet Take 1 tablet (100 mg total) by mouth daily.   warfarin (COUMADIN) 5 MG tablet TAKE 1 TABLET BY MOUTH DAILY EXCEPT TAKE ONE AND ONE-HALF TABLETS ON MONDAY, WEDNESDAY, AND FRIDAY OR AS DIRECTED BY ANTICOAGULATION CLINIC   No facility-administered encounter medications on file as of 08/03/2023.    Allergies (verified) Lisinopril and Zocor [simvastatin]   History: Past Medical History:  Diagnosis Date   Abnormal transaminases    Acid reflux    Allergy    Bradycardia    nocturnal   Colon polyps 02/07/2012   Dr. Stan Head   Elevated sed rate 07/06/2017   HTN (hypertension) 06/02/2014   Low back pain 06/15/2015   Measles ([place])    Mitral regurgitation  Mumps    Neuropathy 01/10/2016   NICM (nonischemic cardiomyopathy) (HCC)    Persistent atrial fibrillation (HCC) 08/24/2010   Preventative health care 12/04/2013   Colonsocopy May '13 - adenomatous polyps  Immunizations Tdap Jan '14    Stroke [facility]) 08/24/2010   Systolic CHF (HCC)    Urinary hesitancy 12/21/2014   Varicella    Ventricular tachycardia, non-sustained (HCC)    Past Surgical History:  Procedure Laterality Date   CARDIAC CATHETERIZATION     CARDIOVERSION  08/15/2012   Procedure: CARDIOVERSION;  Surgeon: Duke Salvia, MD;   Location: [facility] ENDOSCOPY;  Service: Cardiovascular;  Laterality: N/A;   CARDIOVERSION N/A 05/28/2018   Procedure: CARDIOVERSION;  Surgeon: Pricilla Riffle, MD;  Location: [facility] ENDOSCOPY;  Service: Cardiovascular;  Laterality: N/A;   COLONOSCOPY W/ BIOPSIES  02/07/2012   Dr. Stan Head   Family History  Problem Relation Age of Onset   Alzheimer's disease Mother    Heart disease Father        CAD/MI   Hypertension Father    Mental illness Father        h/o depression requiring hospitalization   Hypertension Sister    Neuropathy Sister    Arthritis Sister    Hyperlipidemia Sister        x 2   Hypertension Sister    Arthritis Sister    Obesity Sister    Lung cancer Brother        x 2, smokers   Hypertension Brother    Heart disease Brother    Lung cancer Brother    Hypertension Brother    Colon polyps Brother    Hypertension Brother    Intellectual disability Brother    Varicose Veins Daughter    Birth defects Paternal Aunt    Colon cancer Neg Hx    Rectal cancer Neg Hx    Stomach cancer Neg Hx    Social History   Socioeconomic History   Marital status: Married    Spouse name: [name]   Number of children: 2   Years of education: 16   Highest education level: Not on file  Occupational History   Occupation: Production designer, theatre/television/film of Teacher, music: [employer]  Tobacco Use   Smoking status: Never   Smokeless tobacco: Never  Vaping Use   Vaping status: Never Used  Substance and Sexual Activity   Alcohol use: Yes    Alcohol/week: 0.0 standard drinks of alcohol    Comment: occasional wine   Drug use: No   Sexual activity: Yes    Partners: Female    Comment: lives with wife, no dietary restrictions, except avoid dairy, uses Lactaid, works with [employer]  Other Topics Concern   Not on file  Social History Narrative   Patient is married with 2 children. 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   Patient has 16 years of education.   Patient drinks 1-2 cups caffeine daily.   Never smoker occasional wine no drug use   Social Determinants of Health   Financial Resource Strain: Low Risk  (08/03/2023)   Overall Financial Resource Strain (CARDIA)    Difficulty of Paying Living Expenses: Not hard at all  Food Insecurity: No Food Insecurity (08/03/2023)   Hunger Vital Sign  Worried About Programme researcher, broadcasting/film/video in the Last Year: Never true    Ran Out of Food in the Last Year: Never true  Transportation Needs: No Transportation Needs (08/03/2023)   PRAPARE - Administrator, Civil Service (Medical): No    Lack of Transportation (Non-Medical): No  Physical Activity: Inactive (08/03/2023)   Exercise Vital Sign    Days of Exercise per Week: 0 days    Minutes of Exercise per Session: 0 min  Stress: No Stress Concern Present (08/03/2023)   [employer] of Occupational Health - Occupational Stress Questionnaire    Feeling of Stress : Not at all  Social Connections: Socially Integrated (08/03/2023)   Social Connection and Isolation Panel [NHANES]    Frequency of Communication with Friends and Family: More than three times a week    Frequency of Social Gatherings with Friends and Family: More than three times a week    Attends Religious Services: More than 4 times per year    Active Member of [employer] or Organizations: Yes    Attends Engineer, structural: More than 4 times per year    Marital Status: Married    Tobacco Counseling Counseling given: Not Answered   Clinical Intake:  Pre-visit preparation completed: Yes  Pain : No/denies pain     BMI - recorded: 27.75 Nutritional Status: BMI 25 -29 Overweight Nutritional Risks: None Diabetes: No  How often do you need to have someone help you when you read instructions, pamphlets, or other written materials from your  doctor or pharmacy?: 1 - Never  Interpreter Needed?: No  Information entered by :: Theresa Mulligan LPN   Activities of Daily Living    08/03/2023    9:00 AM  In your present state of health, do you have any difficulty performing the following activities:  Hearing? 0  Vision? 0  Difficulty concentrating or making decisions? 0  Walking or climbing stairs? 0  Dressing or bathing? 0  Doing errands, shopping? 0  Preparing Food and eating ? N  Using the Toilet? N  In the past six months, have you accidently leaked urine? N  Do you have problems with loss of bowel control? N  Managing your Medications? N  Managing your Finances? N  Housekeeping or managing your Housekeeping? N    Patient Care Team: Bradd Canary, MD as PCP - General (Family Medicine) Duke Salvia, MD as PCP - Electrophysiology (Cardiology) Duke Salvia, MD as PCP - Cardiology (Cardiology) Iva Boop, MD as Consulting Physician (Gastroenterology) Micki Riley, MD as Consulting Physician (Neurology)  Indicate any recent Medical Services you may have received from other than [facility] providers in the past year (date may be approximate).     Assessment:   This is a routine wellness examination for [facility].  Hearing/Vision screen Hearing Screening - Comments:: Denies hearing difficulties   Vision Screening - Comments:: Wears rx glasses - up to date with routine eye exams with  Dr Nile Riggs   Goals Addressed               This Visit's Progress     Stay Active (pt-stated)         Depression Screen    08/03/2023    9:00 AM 05/23/2023    8:56 AM 05/16/2022    8:16 AM 05/10/2021    8:17 AM 05/04/2020    2:49 PM 07/10/2018    9:20 AM 07/06/2017    9:31 AM  PHQ 2/9  Scores  PHQ - 2 Score 0 0 0 0 0 0 0  PHQ- 9 Score  0 0        Fall Risk    08/03/2023    9:01 AM 05/23/2023    8:56 AM 05/16/2022    8:15 AM 05/10/2021    8:17 AM 07/10/2018    9:20 AM  Fall Risk   Falls in the past year? 0 0 0 0  No  Number falls in past yr: 0 0 0 0   Injury with Fall? 0 0 0 0   Risk for fall due to : No Fall Risks   No Fall Risks   Follow up Falls prevention discussed Falls evaluation completed  Falls evaluation completed     MEDICARE RISK AT HOME: Medicare Risk at Home Any stairs in or around the home?: Yes If so, are there any without handrails?: No Home free of loose throw rugs in walkways, pet beds, electrical cords, etc?: Yes Adequate lighting in your home to reduce risk of falls?: Yes Life alert?: No Use of a cane, walker or w/c?: No Grab bars in the bathroom?: No Shower chair or bench in shower?: No Elevated toilet seat or a handicapped toilet?: No  TIMED UP AND GO:  Was the test performed?  No    Cognitive Function:        08/03/2023    9:02 AM  6CIT Screen  What Year? 0 points  What month? 0 points  What time? 0 points  Count back from 20 0 points  Months in reverse 0 points  Repeat phrase 0 points  Total Score 0 points    Immunizations Immunization History  Administered Date(s) Administered   Fluad Trivalent(High Dose 65+) 05/23/2023   Influenza Split 06/05/2012   Influenza,inj,Quad PF,6+ Mos 05/28/2013, 05/30/2014, 06/15/2015, 05/25/2016, 05/17/2017, 05/15/2018, 06/11/2019, 06/23/2020, 06/28/2022   PFIZER(Purple Top)SARS-COV-2 Vaccination 12/04/2019, 12/25/2019, 07/06/2020, 03/15/2021, 06/26/2023   PNEUMOCOCCAL CONJUGATE-20 05/23/2023   Tdap 10/03/2012, 06/19/2023   Zoster Recombinant(Shingrix) 07/10/2018, 04/16/2019    TDAP status: Up to date  Flu Vaccine status: Up to date  Pneumococcal vaccine status: Up to date  Covid-19 vaccine status: Completed vaccines  Qualifies for Shingles Vaccine? Yes   Zostavax completed Yes   Shingrix Completed?: Yes  Screening Tests Health Maintenance  Topic Date Due   COVID-19 Vaccine (6 - 2023-24 season) 08/21/2023   Medicare Annual Wellness (AWV)  08/02/2024   Colonoscopy  01/29/2031   DTaP/Tdap/Td (3 - Td or  Tdap) 06/18/2033   Pneumonia Vaccine 67+ Years old  Completed   INFLUENZA VACCINE  Completed   Hepatitis C Screening  Completed   Zoster Vaccines- Shingrix  Completed   HPV VACCINES  Aged Out   HIV Screening  Discontinued    Health Maintenance  There are no preventive care reminders to display for this patient.   Colorectal cancer screening: Type of screening: Colonoscopy. Completed 01/28/21. Repeat every 10 years    Additional Screening:  Hepatitis C Screening: does qualify; Completed 07/06/17  Vision Screening: Recommended annual ophthalmology exams for early detection of glaucoma and other disorders of the eye. Is the patient up to date with their annual eye exam?  Yes  Who is the provider or what is the name of the office in which the patient attends annual eye exams? Dr Nile Riggs If pt is not established with a provider, would they like to be referred to a provider to establish care? No .   Dental Screening: Recommended annual dental  exams for proper oral hygiene    Community Resource Referral / Chronic Care Management:  CRR required this visit?  No   CCM required this visit?  No     Plan:     I have personally reviewed and noted the following in the patient's chart:   Medical and social history Use of alcohol, tobacco or illicit drugs  Current medications and supplements including opioid prescriptions. Patient is not currently taking opioid prescriptions. Functional ability and status Nutritional status Physical activity Advanced directives List of other physicians Hospitalizations, surgeries, and ER visits in previous 12 months Vitals Screenings to include cognitive, depression, and falls Referrals and appointments  In addition, I have reviewed and discussed with patient certain preventive protocols, quality metrics, and best practice recommendations. A written personalized care plan for preventive services as well as general preventive health  recommendations were provided to patient.     Tillie Rung, LPN   [id number]   After Visit Summary: (MyChart) Due to this being a telephonic visit, the after visit summary with patients personalized plan was offered to patient via MyChart   Nurse Notes: None

## 2023-08-03 NOTE — Patient Instructions (Addendum)
Tyler Sims , Thank you for taking time to come for your Medicare Wellness Visit. I appreciate your ongoing commitment to your health goals. Please review the following plan we discussed and let me know if I can assist you in the future.   Referrals/Orders/Follow-Ups/Clinician Recommendations:   This is a list of the screening recommended for you and due dates:  Health Maintenance  Topic Date Due   COVID-19 Vaccine (6 - 2023-24 season) 08/21/2023   Medicare Annual Wellness Visit  08/02/2024   Colon Cancer Screening  01/29/2031   DTaP/Tdap/Td vaccine (3 - Td or Tdap) 06/18/2033   Pneumonia Vaccine  Completed   Flu Shot  Completed   Hepatitis C Screening  Completed   Zoster (Shingles) Vaccine  Completed   HPV Vaccine  Aged Out   HIV Screening  Discontinued    Advanced directives: (Copy Requested) Please bring a copy of your health care power of attorney and living will to the office to be added to your chart at your convenience.  Next Medicare Annual Wellness Visit scheduled for next year: Yes

## 2023-08-23 ENCOUNTER — Other Ambulatory Visit: Payer: Self-pay | Admitting: Internal Medicine

## 2023-09-16 ENCOUNTER — Other Ambulatory Visit: Payer: Self-pay | Admitting: Internal Medicine

## 2023-09-19 NOTE — Progress Notes (Signed)
  Electrophysiology Office Note:   Date:  09/21/2023  ID:  Tyler Sims, DOB 09/25/1955, MRN [id number]  Primary Cardiologist: Elspeth Sage, MD Electrophysiologist: Elspeth Sage, MD      History of Present Illness:   Tyler Sims is a 67 y.o. male with h/o CVA, AF, NICM, recovered EF, and HTN seen today for routine electrophysiology followup.   Since last being seen in our clinic the patient reports doing very well. 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 Biggest complaint is neuropathy that Gabapentin  helps some with. No bleeding on coumadin . Rare palpitations. Otherwise, he denies chest pain, dyspnea, PND, orthopnea, nausea, vomiting, dizziness, syncope, edema, weight gain, or early satiety.   Review of systems complete and found to be negative unless listed in HPI.   EP Information / Studies Reviewed:    EKG is ordered today. Personal review as below.  EKG Interpretation Date/Time:  Thursday September 21 2023 07:59:17 EST Ventricular Rate:  70 PR Interval:  164 QRS Duration:  90 QT Interval:  368 QTC Calculation: 397 R Axis:   37  Text Interpretation: Normal sinus rhythm Normal ECG When compared with ECG of 19-Jun-2018 08:37, QRS axis Shifted right Criteria for Septal infarct are no longer Present Confirmed by Lesia Sharper [phone]) on 09/21/2023 8:08:03 AM    Physical Exam:   VS:  BP 114/66   Pulse 70   Ht 6' 3 (1.905 m)   Wt 221 lb 9.6 oz (100.5 kg)   SpO2 99%   BMI 27.70 kg/m    Wt Readings from Last 3 Encounters:  09/21/23 221 lb 9.6 oz (100.5 kg)  08/03/23 222 lb (100.7 kg)  05/23/23 222 lb (100.7 kg)     GEN: Well nourished, well developed in no acute distress NECK: No JVD; No carotid bruits CARDIAC: Regular rate and rhythm, no murmurs, rubs, gallops RESPIRATORY:  Clear to auscultation without rales, wheezing or rhonchi  ABDOMEN: Soft, non-tender, non-distended EXTREMITIES:  No edema; No deformity   ASSESSMENT AND PLAN:    Paroxysmal  atrial fibrillation EKG today shows NSR Continue coumadin  per PCP Had bleeding on Xarelto .   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    Follow up with Dr. Kennyth in 12 months to assume care from Dr. Sage.   Signed, Sharper Prentice Lesia, PA-C

## 2023-09-21 ENCOUNTER — Encounter: Payer: Self-pay | Admitting: Student

## 2023-09-21 ENCOUNTER — Ambulatory Visit: Payer: Medicare HMO | Attending: Student | Admitting: Student

## 2023-09-21 VITALS — BP 114/66 | HR 70 | Ht 75.0 in | Wt 221.6 lb

## 2023-09-21 DIAGNOSIS — I4819 Other persistent atrial fibrillation: Secondary | ICD-10-CM

## 2023-09-21 DIAGNOSIS — Z79899 Other long term (current) drug therapy: Secondary | ICD-10-CM

## 2023-09-21 DIAGNOSIS — I429 Cardiomyopathy, unspecified: Secondary | ICD-10-CM | POA: Diagnosis not present

## 2023-09-21 DIAGNOSIS — R001 Bradycardia, unspecified: Secondary | ICD-10-CM

## 2023-09-21 NOTE — Patient Instructions (Signed)
 Medication Instructions:  Your physician recommends that you continue on your current medications as directed. Please refer to the Current Medication list given to you today.  *If you need a refill on your cardiac medications before your next appointment, please call your pharmacy*   Lab Work: None ordered.  If you have labs (blood work) drawn today and your tests are completely normal, you will receive your results only by: MyChart Message (if you have MyChart) OR A paper copy in the mail If you have any lab test that is abnormal or we need to change your treatment, we will call you to review the results.   Testing/Procedures: None ordered.    Follow-Up: At [facility], you and your health needs are our priority.  As part of our continuing mission to provide you with exceptional heart care, we have created designated Provider Care Teams.  These Care Teams include your primary Cardiologist (physician) and Advanced Practice Providers (APPs -  Physician Assistants and Nurse Practitioners) who all work together to provide you with the care you need, when you need it.  We recommend signing up for the patient portal called "MyChart".  Sign up information is provided on this After Visit Summary.  MyChart is used to connect with patients for Virtual Visits (Telemedicine).  Patients are able to view lab/test results, encounter notes, upcoming appointments, etc.  Non-urgent messages can be sent to your provider as well.   To learn more about what you can do with MyChart, go to [email or web address].    Your next appointment:   12 months with Dr Jimmey Ralph

## 2023-09-26 ENCOUNTER — Ambulatory Visit (INDEPENDENT_AMBULATORY_CARE_PROVIDER_SITE_OTHER): Payer: Medicare HMO

## 2023-09-26 DIAGNOSIS — Z7901 Long term (current) use of anticoagulants: Secondary | ICD-10-CM

## 2023-09-26 LAB — POCT INR: INR: 2.1 (ref 2.0–3.0)

## 2023-09-26 NOTE — Progress Notes (Signed)
Continue to take 1 tablet daily except 1 1/2 tablets on Mondays, Wednesdays, and Fridays.  Recheck in 6 weeks. 

## 2023-09-26 NOTE — Patient Instructions (Addendum)
Pre visit review using our clinic review tool, if applicable. No additional management support is needed unless otherwise documented below in the visit note.  Continue to take 1 tablet daily except 1 1/2 tablets on Mondays, Wednesdays, and Fridays.  Recheck in 6 weeks.   

## 2023-10-02 ENCOUNTER — Other Ambulatory Visit: Payer: Self-pay | Admitting: Family

## 2023-10-02 ENCOUNTER — Encounter: Payer: Self-pay | Admitting: Family Medicine

## 2023-10-02 MED ORDER — SILDENAFIL CITRATE 50 MG PO TABS: 25.0000 mg | ORAL_TABLET | Freq: Every day | ORAL | 1 refills | Status: DC | PRN

## 2023-10-03 MED ORDER — SILDENAFIL CITRATE 50 MG PO TABS: 25.0000 mg | ORAL_TABLET | Freq: Every day | ORAL | 1 refills | Status: DC | PRN

## 2023-10-03 NOTE — Telephone Encounter (Signed)
 Rx was sent to wrong pharmacy.  Rx resent to correct pharmacy.

## 2023-10-11 ENCOUNTER — Other Ambulatory Visit: Payer: Self-pay | Admitting: Internal Medicine

## 2023-11-07 ENCOUNTER — Ambulatory Visit (INDEPENDENT_AMBULATORY_CARE_PROVIDER_SITE_OTHER): Payer: Medicare HMO

## 2023-11-07 DIAGNOSIS — Z7901 Long term (current) use of anticoagulants: Secondary | ICD-10-CM | POA: Diagnosis not present

## 2023-11-07 LAB — POCT INR: INR: 2.2 (ref 2.0–3.0)

## 2023-11-07 NOTE — Patient Instructions (Addendum)
Pre visit review using our clinic review tool, if applicable. No additional management support is needed unless otherwise documented below in the visit note.  Continue to take 1 tablet daily except 1 1/2 tablets on Mondays, Wednesdays, and Fridays.  Recheck in 6 weeks.   

## 2023-11-07 NOTE — Progress Notes (Signed)
Continue to take 1 tablet daily except 1 1/2 tablets on Mondays, Wednesdays, and Fridays.  Recheck in 6 weeks. 

## 2023-11-25 ENCOUNTER — Other Ambulatory Visit: Payer: Self-pay | Admitting: Family Medicine

## 2023-12-19 ENCOUNTER — Ambulatory Visit (INDEPENDENT_AMBULATORY_CARE_PROVIDER_SITE_OTHER): Payer: Medicare HMO

## 2023-12-19 DIAGNOSIS — Z7901 Long term (current) use of anticoagulants: Secondary | ICD-10-CM | POA: Diagnosis not present

## 2023-12-19 LAB — POCT INR: INR: 2.3 (ref 2.0–3.0)

## 2023-12-19 NOTE — Progress Notes (Signed)
Continue to take 1 tablet daily except 1 1/2 tablets on Mondays, Wednesdays, and Fridays.  Recheck in 6 weeks. 

## 2023-12-19 NOTE — Patient Instructions (Addendum)
Pre visit review using our clinic review tool, if applicable. No additional management support is needed unless otherwise documented below in the visit note.  Continue to take 1 tablet daily except 1 1/2 tablets on Mondays, Wednesdays, and Fridays.  Recheck in 6 weeks.   

## 2024-01-01 ENCOUNTER — Other Ambulatory Visit: Payer: Self-pay | Admitting: Family Medicine

## 2024-01-30 ENCOUNTER — Ambulatory Visit (INDEPENDENT_AMBULATORY_CARE_PROVIDER_SITE_OTHER)

## 2024-01-30 DIAGNOSIS — Z7901 Long term (current) use of anticoagulants: Secondary | ICD-10-CM | POA: Diagnosis not present

## 2024-01-30 LAB — POCT INR: INR: 2.7 (ref 2.0–3.0)

## 2024-01-30 NOTE — Progress Notes (Signed)
Continue to take 1 tablet daily except 1 1/2 tablets on Mondays, Wednesdays, and Fridays.  Recheck in 6 weeks. 

## 2024-01-30 NOTE — Patient Instructions (Addendum)
Pre visit review using our clinic review tool, if applicable. No additional management support is needed unless otherwise documented below in the visit note.  Continue to take 1 tablet daily except 1 1/2 tablets on Mondays, Wednesdays, and Fridays.  Recheck in 6 weeks.   

## 2024-03-07 ENCOUNTER — Other Ambulatory Visit: Payer: Self-pay | Admitting: Family Medicine

## 2024-03-12 ENCOUNTER — Ambulatory Visit

## 2024-03-12 DIAGNOSIS — Z7901 Long term (current) use of anticoagulants: Secondary | ICD-10-CM

## 2024-03-12 LAB — POCT INR: INR: 2.2 (ref 2.0–3.0)

## 2024-03-12 NOTE — Patient Instructions (Addendum)
Pre visit review using our clinic review tool, if applicable. No additional management support is needed unless otherwise documented below in the visit note.  Continue to take 1 tablet daily except 1 1/2 tablets on Mondays, Wednesdays, and Fridays.  Recheck in 6 weeks.   

## 2024-03-12 NOTE — Progress Notes (Addendum)
 Continue to take 1 tablet daily except 1 1/2 tablets on Mondays, Wednesdays, and Fridays.  Recheck in 6 weeks.  Medical screening examination/treatment/procedure(s) were performed by non-physician practitioner and as supervising physician I was immediately available for consultation/collaboration.  I agree with above. Karlynn Noel, MD

## 2024-03-19 ENCOUNTER — Other Ambulatory Visit: Payer: Self-pay | Admitting: Family Medicine

## 2024-04-23 ENCOUNTER — Ambulatory Visit (INDEPENDENT_AMBULATORY_CARE_PROVIDER_SITE_OTHER)

## 2024-04-23 DIAGNOSIS — Z7901 Long term (current) use of anticoagulants: Secondary | ICD-10-CM

## 2024-04-23 LAB — POCT INR: INR: 2.5 (ref 2.0–3.0)

## 2024-04-23 NOTE — Patient Instructions (Addendum)
Pre visit review using our clinic review tool, if applicable. No additional management support is needed unless otherwise documented below in the visit note.  Continue to take 1 tablet daily except 1 1/2 tablets on Mondays, Wednesdays, and Fridays.  Recheck in 6 weeks.   

## 2024-04-23 NOTE — Progress Notes (Signed)
Continue to take 1 tablet daily except 1 1/2 tablets on Mondays, Wednesdays, and Fridays.  Recheck in 6 weeks. 

## 2024-05-28 ENCOUNTER — Encounter: Payer: Medicare HMO | Admitting: Family Medicine

## 2024-05-31 ENCOUNTER — Encounter: Payer: Self-pay | Admitting: Family Medicine

## 2024-06-04 ENCOUNTER — Ambulatory Visit (INDEPENDENT_AMBULATORY_CARE_PROVIDER_SITE_OTHER)

## 2024-06-04 DIAGNOSIS — Z7901 Long term (current) use of anticoagulants: Secondary | ICD-10-CM

## 2024-06-04 LAB — POCT INR: INR: 3.2 — AB (ref 2.0–3.0)

## 2024-06-04 NOTE — Progress Notes (Signed)
 Reduce dose today to take 1/2 tablet and then continue to take 1 tablet daily except 1 1/2 tablets on Mondays, Wednesdays, and Fridays.  Recheck in 4 weeks.

## 2024-06-04 NOTE — Patient Instructions (Addendum)
 Pre visit review using our clinic review tool, if applicable. No additional management support is needed unless otherwise documented below in the visit note.  Reduce dose today to take 1/2 tablet and then continue to take 1 tablet daily except 1 1/2 tablets on Mondays, Wednesdays, and Fridays.  Recheck in 4 weeks.

## 2024-06-15 ENCOUNTER — Other Ambulatory Visit: Payer: Self-pay | Admitting: Family Medicine

## 2024-06-15 DIAGNOSIS — G629 Polyneuropathy, unspecified: Secondary | ICD-10-CM

## 2024-06-26 ENCOUNTER — Other Ambulatory Visit: Payer: Self-pay | Admitting: Family Medicine

## 2024-07-02 ENCOUNTER — Ambulatory Visit

## 2024-07-02 DIAGNOSIS — Z7901 Long term (current) use of anticoagulants: Secondary | ICD-10-CM | POA: Diagnosis not present

## 2024-07-02 LAB — POCT INR: INR: 2.3 (ref 2.0–3.0)

## 2024-07-02 NOTE — Progress Notes (Addendum)
 Indication: persistent Afib Continue to take 1 tablet daily except 1 1/2 tablets on Mondays, Wednesdays, and Fridays.  Recheck in 4 weeks. If therapeutic at next apt will move to 6 weeks.

## 2024-07-02 NOTE — Patient Instructions (Addendum)
 Pre visit review using our clinic review tool, if applicable. No additional management support is needed unless otherwise documented below in the visit note.  Continue to take 1 tablet daily except 1 1/2 tablets on Mondays, Wednesdays, and Fridays.  Recheck in 4 weeks.

## 2024-07-30 ENCOUNTER — Ambulatory Visit (INDEPENDENT_AMBULATORY_CARE_PROVIDER_SITE_OTHER)

## 2024-07-30 DIAGNOSIS — Z7901 Long term (current) use of anticoagulants: Secondary | ICD-10-CM | POA: Diagnosis not present

## 2024-07-30 LAB — POCT INR: INR: 2.2 (ref 2.0–3.0)

## 2024-07-30 NOTE — Patient Instructions (Addendum)
 Pre visit review using our clinic review tool, if applicable. No additional management support is needed unless otherwise documented below in the visit note.  Continue to take 1 tablet daily except 1 1/2 tablets on Mondays, Wednesdays, and Fridays.  Recheck in 7 weeks.

## 2024-07-30 NOTE — Progress Notes (Signed)
 Indication: persistent Afib Continue to take 1 tablet daily except 1 1/2 tablets on Mondays, Wednesdays, and Fridays.  Recheck in 7 weeks.

## 2024-08-02 ENCOUNTER — Other Ambulatory Visit: Payer: Self-pay | Admitting: Family Medicine

## 2024-08-04 NOTE — Assessment & Plan Note (Signed)
 Supplement and monitor

## 2024-08-04 NOTE — Assessment & Plan Note (Signed)
Patient encouraged to maintain heart healthy diet, regular exercise, adequate sleep. Consider daily probiotics. Take medications as prescribed. Labs ordered and reviewed. Colonoscopy May 2022 repeat in 10 years. Given and reviewed copy of ACP documents from [employer] and encouraged to complete and return

## 2024-08-04 NOTE — Assessment & Plan Note (Signed)
Rate controlled, tolerating Warfarin

## 2024-08-04 NOTE — Assessment & Plan Note (Signed)
Following with cardiology and stable

## 2024-08-04 NOTE — Progress Notes (Unsigned)
 Subjective:    Patient ID: Tyler Sims, male    DOB: 02-May-1956, 68 y.o.   MRN: [id number]  No chief complaint on file.   HPI Discussed the use of AI scribe software for clinical note transcription with the patient, who gave verbal consent to proceed.  History of Present Illness Tyler Sims is a 68 year old male with a history of stroke and peripheral neuropathy who presents with sleep disturbances.  He experiences significant sleep disturbances, primarily difficulty falling asleep unless he takes Tylenol PM. Without medication, it can take until 2 AM to fall asleep, despite going to bed at 11 PM. He typically wakes up around 5:30 or 6 AM. With Tylenol PM, he can fall asleep within an hour and sleeps until 5:30 or 6 AM. He takes two pills of Tylenol PM at night and is concerned about the potential long-term effects of the sleep aid, particularly regarding dementia.  He has a history of a stroke in 2011, after which he began experiencing sleep issues. He also has peripheral neuropathy, which he believes may contribute to his sleep problems. The neuropathy affects both sides equally, and he sometimes struggles with balance, needing to focus on his movements to avoid falls. He is aware that his sister-in-law had severe neuropathy.  He has been on Coumadin  since his stroke in 2011 and has experienced stable INR levels, with only two instances of unexplained fluctuations in 14 years. He attempted to switch to a newer anticoagulant but experienced bleeding, prompting a return to Coumadin .  He maintains an active lifestyle, working a couple of days a week and volunteering at [employer]. He is mindful of his diet due to Coumadin , avoiding excessive green vegetables. He has not experienced any recent illnesses or ER visits and has received all recommended vaccinations, including flu, pneumonia, RSV, and shingles shots.    Past Medical History:  Diagnosis Date   Abnormal transaminases     Acid reflux    Allergy    Bradycardia    nocturnal   Colon polyps 02/07/2012   Dr. Lupita Commander   Elevated sed rate 07/06/2017   HTN (hypertension) 06/02/2014   Low back pain 06/15/2015   Measles ([place])    Mitral regurgitation    Mumps    Neuropathy 01/10/2016   NICM (nonischemic cardiomyopathy) (HCC)    Persistent atrial fibrillation (HCC) 08/24/2010   Preventative health care 12/04/2013   Colonsocopy May '13 - adenomatous polyps  Immunizations Tdap Jan '14    Stroke [facility]) 08/24/2010   Systolic CHF (HCC)    Urinary hesitancy 12/21/2014   Varicella    Ventricular tachycardia, non-sustained (HCC)     Past Surgical History:  Procedure Laterality Date   CARDIAC CATHETERIZATION     CARDIOVERSION  08/15/2012   Procedure: CARDIOVERSION;  Surgeon: Elspeth JAYSON Sage, MD;  Location: [facility] ENDOSCOPY;  Service: Cardiovascular;  Laterality: N/A;   CARDIOVERSION N/A 05/28/2018   Procedure: CARDIOVERSION;  Surgeon: Okey Vina GAILS, MD;  [facility] END[facility];  Service: Cardiovascular;  Laterality: N/A;   COLONOSCOPY W/ BIOPSIES  02/07/2012   Dr. Lupita Commander    Family History  Problem Relation Age of Onset   Alzheimer's disease Mother    Heart disease Father        CAD/MI   Hypertension Father    Mental illness Father        h/o depression requiring hospitalization   Hypertension Sister    Neuropathy Sister    Arthritis Sister  Hyperlipidemia Sister        x 2   Hypertension Sister    Arthritis Sister    Obesity Sister    Lung cancer Brother        x 2, smokers   Hypertension Brother    Heart disease Brother    Lung cancer Brother    Hypertension Brother    Colon polyps Brother    Hypertension Brother    Intellectual disability Brother    Varicose Veins Daughter    Birth defects Paternal Aunt    Colon cancer Neg Hx    Rectal cancer Neg Hx    Stomach cancer Neg Hx     Social History   Socioeconomic History   Marital status: Married    Spouse name: Rudell   Number of  children: 2   Years of education: 16   Highest education level: Bachelor's degree (e.g., BA, AB, BS)  Occupational History   Occupation: Technical Sales Engineer: [employer]  Tobacco Use   Smoking status: Never   Smokeless tobacco: Never  Vaping Use   Vaping status: Never Used  Substance and Sexual Activity   Alcohol use: Yes    Alcohol/week: 0.0 standard drinks of alcohol    Comment: occasional wine   Drug use: No   Sexual activity: Yes    Partners: Female    Comment: lives with wife, no dietary restrictions, except avoid dairy, uses Lactaid, works with [employer]  Other Topics Concern   Not on file  Social History Narrative   Patient is married with 2 children. 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   Patient has 16 years of education.   Patient drinks 1-2 cups caffeine daily.   Never smoker occasional wine no drug use   Social Drivers of Corporate Investment Banker Strain: Low Risk  (07/29/2024)   Overall Financial Resource Strain (CARDIA)    Difficulty of Paying Living Expenses: Not hard at all  Food Insecurity: No Food Insecurity (07/29/2024)   Hunger Vital Sign    Worried About Running Out of Food in the Last Year: Never true    Ran Out of Food in the Last Year: Never true  Transportation Needs: No Transportation Needs (07/29/2024)   PRAPARE - Administrator, Civil Service (Medical): No    Lack of Transportation (Non-Medical): No  Physical Activity: Insufficiently Active (07/29/2024)   Exercise Vital Sign    Days of Exercise per Week: 2 days    Minutes of Exercise per Session: 60 min  Stress: Stress Concern Present (07/29/2024)   [employer] of Occupational Health - Occupational Stress Questionnaire    Feeling of Stress: To some extent  Social Connections: Socially  Integrated (07/29/2024)   Social Connection and Isolation Panel    Frequency of Communication with Friends and Family: More than three times a week    Frequency of Social Gatherings with Friends and Family: Once a week    Attends Religious Services: More than 4 times per year    Active Member of [employer] or Organizations: Yes    Attends Banker Meetings: More than 4 times per year    Marital Status: Married  Catering Manager Violence: Not At Risk (08/03/2023)   Humiliation,  Afraid, Rape, and Kick questionnaire    Fear of Current or Ex-Partner: No    Emotionally Abused: No    Physically Abused: No    Sexually Abused: No    Outpatient Medications Prior to Visit  Medication Sig Dispense Refill   atorvastatin  (LIPITOR) 20 MG tablet Take 1 tablet (20 mg total) by mouth daily. 90 tablet 1   cholecalciferol (VITAMIN D ) 1000 units tablet Take 1 tablet (1,000 Units total) by mouth daily. (Patient taking differently: Take 1,000 Units by mouth every other day.)     COD LIVER OIL PO Take 1 capsule by mouth daily.      Coenzyme Q10 (CO Q-10) 200 MG CAPS Take 200 mg by mouth daily.      diphenhydramine-acetaminophen (TYLENOL PM) 25-500 MG TABS tablet Take 2 tablets by mouth at bedtime as needed (pain / sleep).     folic acid  (FOLVITE ) 1 MG tablet TAKE 1 TABLET BY MOUTH EVERY DAY 90 tablet 1   gabapentin  (NEURONTIN ) 800 MG tablet Take 1 tablet (800 mg total) by mouth 3 (three) times daily. 270 tablet 1   losartan -hydrochlorothiazide (HYZAAR) 50-12.5 MG tablet Take 1 tablet by mouth daily. 90 tablet 3   metoprolol  succinate (TOPROL -XL) 25 MG 24 hr tablet Take 1 tablet (25 mg total) by mouth daily. Take with or immediately following a meal 90 tablet 1   sildenafil  (VIAGRA ) 50 MG tablet Take 0.5-2 tablets (25-100 mg total) by mouth daily as needed for erectile dysfunction. 30 tablet 1   spironolactone  (ALDACTONE ) 25 MG tablet TAKE 1 TABLET (25 MG TOTAL) BY MOUTH DAILY. 30 tablet 0   thiamine  (VITAMIN B1) 100 MG tablet Take 1 tablet (100 mg total) by mouth daily. 90 tablet 3   warfarin (COUMADIN ) 5 MG tablet TAKE 1 TABLET BY MOUTH DAILY EXCEPT TAKE ONE AND ONE-HALF TABLETS ON MONDAY, WEDNESDAY, AND FRIDAY OR AS DIRECTED BY ANTICOAGULATION CLINIC 360 tablet 1   No facility-administered medications prior to visit.    Allergies  Allergen Reactions   Lisinopril Rash   Zocor [Simvastatin] Rash    Review of Systems  Constitutional:  Negative for chills, fever and malaise/fatigue.  HENT:  Negative for congestion and hearing loss.   Eyes:  Negative for discharge.  Respiratory:  Negative for cough, sputum production and shortness of breath.   Cardiovascular:  Negative for chest pain, palpitations and leg swelling.  Gastrointestinal:  Negative for abdominal pain, blood in stool, constipation, diarrhea, heartburn, nausea and vomiting.  Genitourinary:  Negative for dysuria, frequency, hematuria and urgency.  Musculoskeletal:  Negative for back pain, falls and myalgias.  Skin:  Negative for rash.  Neurological:  Positive for sensory change. Negative for dizziness, loss of consciousness, weakness and headaches.  Endo/Heme/Allergies:  Negative for environmental allergies. Does not bruise/bleed easily.  Psychiatric/Behavioral:  Negative for depression and suicidal ideas. The patient has insomnia. The patient is not nervous/anxious.        Objective:    Physical Exam Vitals reviewed.  Constitutional:      General: He is not in acute distress.    Appearance: Normal appearance. He is not ill-appearing or diaphoretic.  HENT:     Head: Normocephalic and atraumatic.     Right Ear: Tympanic membrane, ear canal and external ear normal. There is no impacted cerumen.     Left Ear: Tympanic membrane, ear canal and external ear normal. There is no impacted cerumen.     Nose: Nose normal. No rhinorrhea.  Mouth/Throat:     Pharynx: Oropharynx is clear.  Eyes:     General: No scleral  icterus.    Extraocular Movements: Extraocular movements intact.     Conjunctiva/sclera: Conjunctivae normal.     Pupils: Pupils are equal, round, and reactive to light.  Neck:     Thyroid : No thyroid  mass or thyroid  tenderness.  Cardiovascular:     Rate and Rhythm: Normal rate and regular rhythm.     Pulses: Normal pulses.     Heart sounds: Normal heart sounds. No murmur heard. Pulmonary:     Effort: Pulmonary effort is normal.     Breath sounds: Normal breath sounds. No wheezing.  Abdominal:     General: Bowel sounds are normal.     Palpations: Abdomen is soft. There is no mass.     Tenderness: There is no guarding.  Musculoskeletal:        General: No swelling. Normal range of motion.     Cervical back: Normal range of motion and neck supple. No rigidity.     Right lower leg: No edema.     Left lower leg: No edema.  Lymphadenopathy:     Cervical: No cervical adenopathy.  Skin:    General: Skin is warm and dry.     Findings: No rash.  Neurological:     General: No focal deficit present.     Mental Status: He is alert and oriented to person, place, and time.     Cranial Nerves: No cranial nerve deficit.     Deep Tendon Reflexes: Reflexes normal.  Psychiatric:        Mood and Affect: Mood normal.        Behavior: Behavior normal.    There were no vitals taken for this visit. Wt Readings from Last 3 Encounters:  09/21/23 221 lb 9.6 oz (100.5 kg)  08/03/23 222 lb (100.7 kg)  05/23/23 222 lb (100.7 kg)    Diabetic Foot Exam - Simple   No data filed    Lab Results  Component Value Date   WBC 5.8 05/24/2023   HGB 12.3 (L) 05/24/2023   HCT 36.9 (L) 05/24/2023   PLT 204.0 05/24/2023   GLUCOSE 94 05/23/2023   CHOL 134 05/23/2023   TRIG 82.0 05/23/2023   HDL 51.20 05/23/2023   LDLDIRECT 110.4 10/03/2012   LDLCALC 66 05/23/2023   ALT 18 05/23/2023   AST 20 05/23/2023   NA 136 05/23/2023   K 4.9 05/23/2023   CL 100 05/23/2023   CREATININE 1.19 05/23/2023   BUN  20 05/23/2023   CO2 30 05/23/2023   TSH 1.93 05/23/2023   PSA 1.32 05/23/2023   INR 2.2 07/30/2024   HGBA1C 5.2 05/16/2022    Lab Results  Component Value Date   TSH 1.93 05/23/2023   Lab Results  Component Value Date   WBC 5.8 05/24/2023   HGB 12.3 (L) 05/24/2023   HCT 36.9 (L) 05/24/2023   MCV 95.6 05/24/2023   PLT 204.0 05/24/2023   Lab Results  Component Value Date   NA 136 05/23/2023   K 4.9 05/23/2023   CO2 30 05/23/2023   GLUCOSE 94 05/23/2023   BUN 20 05/23/2023   CREATININE 1.19 05/23/2023   BILITOT 1.1 05/23/2023   ALKPHOS 86 05/23/2023   AST 20 05/23/2023   ALT 18 05/23/2023   PROT 7.9 05/23/2023   ALBUMIN 4.2 05/23/2023   CALCIUM  10.5 05/23/2023   EGFR 67 09/28/2022   GFR 63.99 05/23/2023  Lab Results  Component Value Date   CHOL 134 05/23/2023   Lab Results  Component Value Date   HDL 51.20 05/23/2023   Lab Results  Component Value Date   LDLCALC 66 05/23/2023   Lab Results  Component Value Date   TRIG 82.0 05/23/2023   Lab Results  Component Value Date   CHOLHDL 3 05/23/2023   Lab Results  Component Value Date   HGBA1C 5.2 05/16/2022       Assessment & Plan:  Preventative health care Assessment & Plan: Patient encouraged to maintain heart healthy diet, regular exercise, adequate sleep. Consider daily probiotics. Take medications as prescribed. Labs ordered and reviewed. Colonoscopy May 2022 repeat in 10 years. Given and reviewed copy of ACP documents from [facility] Secretary of State and encouraged to complete and return    Paroxysmal atrial fibrillation (HCC) Assessment & Plan: Rate controlled, tolerating Warfarin   Hereditary and idiopathic peripheral neuropathy Assessment & Plan: Continues to have daily symptoms but still active.    Primary hypertension Assessment & Plan: Well controlled, no changes to meds. Encouraged heart healthy diet such as the DASH diet and exercise as tolerated.    Mixed hyperlipidemia Assessment  & Plan: Encourage heart healthy diet such as MIND or DASH diet, increase exercise, avoid trans fats, simple carbohydrates and processed foods, consider a krill or fish or flaxseed oil cap daily. 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 Vaccinations are up to date, including flu, RSV, pneumonia, and shingles. Colonoscopy was performed in 2022, next due in 2032. - Continue routine wellness visits annually. - Maintain up-to-date vaccinations. - Continue regular colonoscopy screenings as scheduled.  Insomnia Chronic insomnia with difficulty falling and staying asleep. Tylenol PM provides some relief but raises concerns about long-term use and potential dementia risk. Cognitive behavioral therapy for insomnia (CBTI) is recommended as a non-pharmacological approach. He prefers to avoid additional medications. - Try CBTI app for insomnia management. - Use Tylenol PM as needed for sleep. - Consider Unisom if CBTI and Tylenol PM are insufficient.  Hereditary and idiopathic peripheral neuropathy Chronic peripheral neuropathy with bilateral symptoms, possibly hereditary. Symptoms include balance issues and pain, contributing to insomnia. Neuropathy may be exacerbated by previous stroke, though typically stroke-related symptoms are unilateral. - Continue current management and monitor symptoms.  Paroxysmal atrial fibrillation on chronic anticoagulation Chronic anticoagulation with warfarin due to paroxysmal atrial fibrillation. Previous attempts to switch to newer anticoagulants resulted in bleeding, so warfarin is continued. INR levels are monitored regularly. - Continue warfarin therapy with regular INR monitoring.  Essential hypertension Hypertension managed with current medication  regimen. - Continue current antihypertensive medications.  Mixed hyperlipidemia Managed with atorvastatin . - Continue atorvastatin  therapy.  Vitamin D  deficiency - Continue vitamin D  supplementation.  Nocturia Noted, possibly related to age and other conditions. - Continue to monitor symptoms and manage conservatively.  Need for advanced directives (healthcare power of attorney, living will) Discussion about completing advanced directives, including healthcare power of attorney and living will. Wife designated as primary healthcare power of attorney, with daughter as backup. Importance of notarization and providing copies to relevant parties discussed. - Complete and notarize healthcare power of attorney and living will. - Provide copies to healthcare power of attorney and backup designee. - Submit a copy to the provider for medical records.  Recording duration:  33 minutes     Harlene Horton, MD

## 2024-08-04 NOTE — Assessment & Plan Note (Signed)
 Encourage heart healthy diet such as MIND or DASH diet, increase exercise, avoid trans fats, simple carbohydrates and processed foods, consider a krill or fish or flaxseed oil cap daily. Tolerating Atorvastatin

## 2024-08-04 NOTE — Assessment & Plan Note (Signed)
 Well controlled, no changes to meds. Encouraged heart healthy diet such as the DASH diet and exercise as tolerated.

## 2024-08-04 NOTE — Assessment & Plan Note (Signed)
Continues to have daily symptoms but still active.

## 2024-08-05 ENCOUNTER — Encounter: Payer: Self-pay | Admitting: Family Medicine

## 2024-08-05 ENCOUNTER — Ambulatory Visit (INDEPENDENT_AMBULATORY_CARE_PROVIDER_SITE_OTHER): Payer: Medicare HMO | Admitting: Family Medicine

## 2024-08-05 VITALS — BP 138/82 | HR 68 | Temp 97.6°F | Resp 16 | Ht 75.0 in | Wt 226.6 lb

## 2024-08-05 DIAGNOSIS — E782 Mixed hyperlipidemia: Secondary | ICD-10-CM | POA: Diagnosis not present

## 2024-08-05 DIAGNOSIS — E559 Vitamin D deficiency, unspecified: Secondary | ICD-10-CM

## 2024-08-05 DIAGNOSIS — I429 Cardiomyopathy, unspecified: Secondary | ICD-10-CM | POA: Diagnosis not present

## 2024-08-05 DIAGNOSIS — G609 Hereditary and idiopathic neuropathy, unspecified: Secondary | ICD-10-CM | POA: Diagnosis not present

## 2024-08-05 DIAGNOSIS — R351 Nocturia: Secondary | ICD-10-CM | POA: Diagnosis not present

## 2024-08-05 DIAGNOSIS — I10 Essential (primary) hypertension: Secondary | ICD-10-CM

## 2024-08-05 DIAGNOSIS — Z0000 Encounter for general adult medical examination without abnormal findings: Secondary | ICD-10-CM

## 2024-08-05 DIAGNOSIS — I480 Paroxysmal atrial fibrillation: Secondary | ICD-10-CM

## 2024-08-05 NOTE — Patient Instructions (Addendum)
 CBTi (insomnia) APP from the [place] to help with sleep Unisom Magnesium Glycinate 200 to 400 at bedtime  Preventive Care 65 Years and Older, Male Preventive care refers to lifestyle choices and visits with your health care provider that can promote health and wellness. Preventive care visits are also called wellness exams. What can I expect for my preventive care visit? Counseling During your preventive care visit, your health care provider may ask about your: Medical history, including: Past medical problems. Family medical history. History of falls. Current health, including: Emotional well-being. Home life and relationship well-being. Sexual activity. Memory and ability to understand (cognition). Lifestyle, including: Alcohol, nicotine or tobacco, and drug use. Access to firearms. Diet, exercise, and sleep habits. Work and work astronomer. Sunscreen use. Safety issues such as seatbelt and bike helmet use. Physical exam Your health care provider will check your: Height and weight. These may be used to calculate your BMI (body mass index). BMI is a measurement that tells if you are at a healthy weight. Waist circumference. This measures the distance around your waistline. This measurement also tells if you are at a healthy weight and may help predict your risk of certain diseases, such as type 2 diabetes and high blood pressure. Heart rate and blood pressure. Body temperature. Skin for abnormal spots. What immunizations do I need?  Vaccines are usually given at various ages, according to a schedule. Your health care provider will recommend vaccines for you based on your age, medical history, and lifestyle or other factors, such as travel or where you work. What tests do I need? Screening Your health care provider may recommend screening tests for certain conditions. This may include: Lipid and cholesterol levels. Diabetes screening. This is done by checking your blood sugar  (glucose) after you have not eaten for a while (fasting). Hepatitis C test. Hepatitis B test. HIV (human immunodeficiency virus) test. STI (sexually transmitted infection) testing, if you are at risk. Lung cancer screening. Colorectal cancer screening. Prostate cancer screening. Abdominal aortic aneurysm (AAA) screening. You may need this if you are a current or former smoker. Talk with your health care provider about your test results, treatment options, and if necessary, the need for more tests. Follow these instructions at home: Eating and drinking  Eat a diet that includes fresh fruits and vegetables, whole grains, lean protein, and low-fat dairy products. Limit your intake of foods with high amounts of sugar, saturated fats, and salt. Take vitamin and mineral supplements as recommended by your health care provider. Do not drink alcohol if your health care provider tells you not to drink. If you drink alcohol: Limit how much you have to 0-2 drinks a day. Know how much alcohol is in your drink. In the [place] one drink equals one 12 oz bottle of beer (355 mL), one 5 oz glass of wine (148 mL), or one 1 oz glass of hard liquor (44 mL). Lifestyle Brush your teeth every morning and night with fluoride toothpaste. Floss one time each day. Exercise for at least 30 minutes 5 or more days each week. Do not use any products that contain nicotine or tobacco. These products include cigarettes, chewing tobacco, and vaping devices, such as e-cigarettes. If you need help quitting, ask your health care provider. Do not use drugs. If you are sexually active, practice safe sex. Use a condom or other form of protection to prevent STIs. Take aspirin only as told by your health care provider. Make sure that you understand how much to  take and what form to take. Work with your health care provider to find out whether it is safe and beneficial for you to take aspirin daily. Ask your health care provider if you  need to take a cholesterol-lowering medicine (statin). Find healthy ways to manage stress, such as: Meditation, yoga, or listening to music. Journaling. Talking to a trusted person. Spending time with friends and family. Safety Always wear your seat belt while driving or riding in a vehicle. Do not drive: If you have been drinking alcohol. Do not ride with someone who has been drinking. When you are tired or distracted. While texting. If you have been using any mind-altering substances or drugs. Wear a helmet and other protective equipment during sports activities. If you have firearms in your house, make sure you follow all gun safety procedures. Minimize exposure to UV radiation to reduce your risk of skin cancer. What's next? Visit your health care provider once a year for an annual wellness visit. Ask your health care provider how often you should have your eyes and teeth checked. Stay up to date on all vaccines. This information is not intended to replace advice given to you by your health care provider. Make sure you discuss any questions you have with your health care provider. Document Revised: 03/03/2021 Document Reviewed: 03/03/2021 Elsevier Patient Education  2024 [employer].

## 2024-08-06 ENCOUNTER — Ambulatory Visit: Payer: Self-pay | Admitting: Family Medicine

## 2024-08-06 LAB — COMPREHENSIVE METABOLIC PANEL WITH GFR
ALT: 26 U/L (ref 0–53)
AST: 29 U/L (ref 0–37)
Albumin: 4.6 g/dL (ref 3.5–5.2)
Alkaline Phosphatase: 82 U/L (ref 39–117)
BUN: 17 mg/dL (ref 6–23)
CO2: 28 meq/L (ref 19–32)
Calcium: 10.5 mg/dL (ref 8.4–10.5)
Chloride: 100 meq/L (ref 96–112)
Creatinine, Ser: 1.25 mg/dL (ref 0.40–1.50)
GFR: 59.81 mL/min — ABNORMAL LOW (ref >60.00)
Glucose, Bld: 86 mg/dL (ref 70–99)
Potassium: 4.8 meq/L (ref 3.5–5.1)
Sodium: 136 meq/L (ref 135–145)
Total Bilirubin: 1.2 mg/dL (ref 0.2–1.2)
Total Protein: 7.9 g/dL (ref 6.0–8.3)

## 2024-08-06 LAB — PSA: PSA: 1.69 ng/mL (ref 0.10–4.00)

## 2024-08-06 LAB — CBC WITH DIFFERENTIAL/PLATELET
Basophils Absolute: 0.1 K/uL (ref 0.0–0.1)
Basophils Relative: 1 % (ref 0.0–3.0)
Eosinophils Absolute: 0.1 K/uL (ref 0.0–0.7)
Eosinophils Relative: 1 % (ref 0.0–5.0)
HCT: 39.5 % (ref 39.0–52.0)
Hemoglobin: 13.6 g/dL (ref 13.0–17.0)
Lymphocytes Relative: 26.7 % (ref 12.0–46.0)
Lymphs Abs: 1.9 K/uL (ref 0.7–4.0)
MCHC: 34.5 g/dL (ref 30.0–36.0)
MCV: 95.4 fl (ref 78.0–100.0)
Monocytes Absolute: 0.7 K/uL (ref 0.1–1.0)
Monocytes Relative: 10.1 % (ref 3.0–12.0)
Neutro Abs: 4.3 K/uL (ref 1.4–7.7)
Neutrophils Relative %: 61.2 % (ref 43.0–77.0)
Platelets: 235 K/uL (ref 150.0–400.0)
RBC: 4.13 Mil/uL — ABNORMAL LOW (ref 4.22–5.81)
RDW: 13.4 % (ref 11.5–15.5)
WBC: 7 K/uL (ref 4.0–10.5)

## 2024-08-06 LAB — LIPID PANEL
Cholesterol: 148 mg/dL (ref 0–200)
HDL: 55.7 mg/dL (ref >39.00)
LDL Cholesterol: 66 mg/dL (ref 0–99)
NonHDL: 92.79
Total CHOL/HDL Ratio: 3
Triglycerides: 133 mg/dL (ref 0.0–149.0)
VLDL: 26.6 mg/dL (ref 0.0–40.0)

## 2024-08-06 LAB — TSH: TSH: 1.59 u[IU]/mL (ref 0.35–5.50)

## 2024-08-06 LAB — VITAMIN D 25 HYDROXY (VIT D DEFICIENCY, FRACTURES): VITD: 51.23 ng/mL (ref 30.00–100.00)

## 2024-08-06 NOTE — Progress Notes (Signed)
Pt reviewed via MyChart.

## 2024-08-07 ENCOUNTER — Encounter: Payer: Self-pay | Admitting: Family Medicine

## 2024-08-28 ENCOUNTER — Other Ambulatory Visit: Payer: Self-pay | Admitting: Family Medicine

## 2024-09-11 ENCOUNTER — Other Ambulatory Visit: Payer: Self-pay | Admitting: Student

## 2024-09-11 MED ORDER — LOSARTAN POTASSIUM-HCTZ 50-12.5 MG PO TABS: 1.0000 | ORAL_TABLET | Freq: Every day | ORAL | 0 refills | Status: DC

## 2024-09-13 ENCOUNTER — Other Ambulatory Visit: Payer: Self-pay | Admitting: Family Medicine

## 2024-09-17 ENCOUNTER — Ambulatory Visit (INDEPENDENT_AMBULATORY_CARE_PROVIDER_SITE_OTHER)

## 2024-09-17 DIAGNOSIS — Z7901 Long term (current) use of anticoagulants: Secondary | ICD-10-CM

## 2024-09-17 LAB — POCT INR: INR: 2.9 (ref 2.0–3.0)

## 2024-09-17 NOTE — Patient Instructions (Addendum)
Pre visit review using our clinic review tool, if applicable. No additional management support is needed unless otherwise documented below in the visit note.  Continue to take 1 tablet daily except 1 1/2 tablets on Mondays, Wednesdays, and Fridays.  Recheck in 6 weeks.   

## 2024-09-17 NOTE — Progress Notes (Signed)
 Indication: persistent Afib Continue to take 1 tablet daily except 1 1/2 tablets on Mondays, Wednesdays, and Fridays.  Recheck in 6 weeks.

## 2024-09-23 ENCOUNTER — Ambulatory Visit (INDEPENDENT_AMBULATORY_CARE_PROVIDER_SITE_OTHER): Admitting: *Deleted

## 2024-09-23 VITALS — BP 122/63 | HR 68 | Temp 98.6°F | Resp 18 | Ht 75.0 in | Wt 226.0 lb

## 2024-09-23 DIAGNOSIS — Z0000 Encounter for general adult medical examination without abnormal findings: Secondary | ICD-10-CM

## 2024-09-23 NOTE — Progress Notes (Signed)
 "  Chief Complaint  Patient presents with   Medicare Wellness     Subjective:   Tyler Sims is a 69 y.o. male who presents for a Medicare Annual Wellness Visit.  Visit info / Clinical Intake: Medicare Wellness Visit Type:: Subsequent Annual Wellness Visit Persons participating in visit and providing information:: patient Medicare Wellness Visit Mode:: In-person (required for WTM) Interpreter Needed?: No Pre-visit prep was completed: yes AWV questionnaire completed by patient prior to visit?: yes Date:: 09/17/24 Living arrangements:: (Patient-Rptd) lives with spouse/significant other Patient's Overall Health Status Rating: (!) (Patient-Rptd) fair Typical amount of pain: (Patient-Rptd) some Does pain affect daily life?: (Patient-Rptd) no Are you currently prescribed opioids?: no  Dietary Habits and Nutritional Risks How many meals a day?: (Patient-Rptd) 2 Eats fruit and vegetables daily?: (!) (Patient-Rptd) no Most meals are obtained by: (Patient-Rptd) preparing own meals; eating out In the last 2 weeks, have you had any of the following?: none Diabetic:: no  Functional Status Activities of Daily Living (to include ambulation/medication): (Patient-Rptd) Independent Ambulation: (Patient-Rptd) Independent Medication Administration: (Patient-Rptd) Independent Home Management (perform basic housework or laundry): (Patient-Rptd) Independent Manage your own finances?: (Patient-Rptd) yes Primary transportation is: (Patient-Rptd) driving Concerns about vision?: no *vision screening is required for WTM* (Plans to establish with new group this month) Concerns about hearing?: no  Fall Screening Falls in the past year?: (Patient-Rptd) 0 Number of falls in past year: 0 Was there an injury with Fall?: 0 Fall Risk Category Calculator: 0 Patient Fall Risk Level: Low Fall Risk  Fall Risk Patient at Risk for Falls Due to: Orthopedic patient Fall risk Follow up: Falls evaluation  completed  Home and Transportation Safety: All rugs have non-skid backing?: (!) (Patient-Rptd) no All stairs or steps have railings?: (Patient-Rptd) yes Grab bars in the bathtub or shower?: (!) (Patient-Rptd) no Have non-skid surface in bathtub or shower?: (!) (Patient-Rptd) no Good home lighting?: (Patient-Rptd) yes Regular seat belt use?: (Patient-Rptd) yes Hospital stays in the last year:: (Patient-Rptd) no  Cognitive Assessment Difficulty concentrating, remembering, or making decisions? : (Patient-Rptd) yes Will 6CIT or Mini Cog be Completed: yes What year is it?: 0 points What month is it?: 0 points Give patient an address phrase to remember (5 components): [address], [place]  About what time is it?: 0 points Count backwards from 20 to 1: 0 points Say the months of the year in reverse: 0 points Repeat the address phrase from earlier: 0 points 6 CIT Score: 0 points  Advance Directives (For Healthcare) Does Patient Have a Medical Advance Directive?: No Would patient like information on creating a medical advance directive?: No - Patient declined  Reviewed/Updated  Reviewed/Updated: Reviewed All (Medical, Surgical, Family, Medications, Allergies, Care Teams, Patient Goals)    Allergies (verified) Lisinopril and Zocor [simvastatin]   Current Medications (verified) Outpatient Encounter Medications as of 09/23/2024  Medication Sig   atorvastatin  (LIPITOR) 20 MG tablet Take 1 tablet (20 mg total) by mouth daily.   cholecalciferol (VITAMIN D ) 1000 units tablet Take 1 tablet (1,000 Units total) by mouth daily. (Patient taking differently: Take 1,000 Units by mouth every other day.)   COD LIVER OIL PO Take 1 capsule by mouth daily.    Coenzyme Q10 (CO Q-10) 200 MG CAPS Take 200 mg by mouth daily.    diphenhydramine-acetaminophen (TYLENOL PM) 25-500 MG TABS tablet Take 2 tablets by mouth at bedtime as needed (pain / sleep).   folic acid  (FOLVITE ) 1 MG tablet TAKE 1 TABLET  BY MOUTH EVERY DAY  gabapentin  (NEURONTIN ) 800 MG tablet Take 1 tablet (800 mg total) by mouth 3 (three) times daily.   losartan -hydrochlorothiazide (HYZAAR) 50-12.5 MG tablet Take 1 tablet by mouth daily.   metoprolol  succinate (TOPROL -XL) 25 MG 24 hr tablet Take 1 tablet (25 mg total) by mouth daily. Take with or immediately following a meal   sildenafil  (VIAGRA ) 50 MG tablet TAKE 1/2 TO 2 TABLETS (25 TO 100 MG TOTAL) DAILY AS NEEDED FOR ERECTILE DYSFUNCTION -GENERIC FOR VIAGRA    spironolactone  (ALDACTONE ) 25 MG tablet Take 1 tablet (25 mg total) by mouth daily.   thiamine (VITAMIN B1) 100 MG tablet Take 1 tablet (100 mg total) by mouth daily.   warfarin (COUMADIN ) 5 MG tablet TAKE 1 TABLET BY MOUTH DAILY EXCEPT TAKE ONE AND ONE-HALF TABLETS ON MONDAY, WEDNESDAY, AND FRIDAY OR AS DIRECTED BY ANTICOAGULATION CLINIC   No facility-administered encounter medications on file as of 09/23/2024.    History: Past Medical History:  Diagnosis Date   Abnormal transaminases    Acid reflux    Allergy    Bradycardia    nocturnal   Colon polyps 02/07/2012   Dr. Lupita Commander   Elevated sed rate 07/06/2017   HTN (hypertension) 06/02/2014   Low back pain 06/15/2015   Measles ([place])    Mitral regurgitation    Mumps    Neuropathy 01/10/2016   NICM (nonischemic cardiomyopathy) (HCC)    Persistent atrial fibrillation (HCC) 08/24/2010   Preventative health care 12/04/2013   Colonsocopy May '13 - adenomatous polyps  Immunizations Tdap Jan '14    Stroke [facility]) 08/24/2010   Systolic CHF (HCC)    Urinary hesitancy 12/21/2014   Varicella    Ventricular tachycardia, non-sustained (HCC)    Past Surgical History:  Procedure Laterality Date   CARDIAC CATHETERIZATION     CARDIOVERSION  08/15/2012   Procedure: CARDIOVERSION;  Surgeon: Elspeth JAYSON Sage, MD;  Location: [facility] ENDOSCOPY;  Service: Cardiovascular;  Laterality: N/A;   CARDIOVERSION N/A 05/28/2018   Procedure: CARDIOVERSION;  Surgeon: Okey Vina GAILS, MD;   Location: [facility] ENDOSCOPY;  Service: Cardiovascular;  Laterality: N/A;   COLONOSCOPY W/ BIOPSIES  02/07/2012   Dr. Lupita Commander   Family History  Problem Relation Age of Onset   Alzheimer's disease Mother    Heart disease Father        CAD/MI   Hypertension Father    Mental illness Father        h/o depression requiring hospitalization   Hypertension Sister    Neuropathy Sister    Arthritis Sister    Hyperlipidemia Sister        x 2   Hypertension Sister    Arthritis Sister    Obesity Sister    Lung cancer Brother        x 2, smokers   Hypertension Brother    Heart disease Brother    Lung cancer Brother    Hypertension Brother    Colon polyps Brother    Hypertension Brother    Intellectual disability Brother    Varicose Veins Daughter    Birth defects Paternal Aunt    Colon cancer Neg Hx    Rectal cancer Neg Hx    Stomach cancer Neg Hx    Social History   Occupational History   Occupation: production designer, theatre/television/film of Teacher, Music: [employer]  Tobacco Use   Smoking status: Never   Smokeless tobacco: Never  Vaping Use   Vaping status: Never Used  Substance and Sexual  Activity   Alcohol use: Yes    Alcohol/week: 0.0 standard drinks of alcohol    Comment: occasional wine   Drug use: No   Sexual activity: Yes    Partners: Female    Comment: lives with wife, no dietary restrictions, except avoid dairy, uses Lactaid, works with [employer]   Tobacco Counseling Counseling given: Not Answered  SDOH Screenings   Food Insecurity: No Food Insecurity (09/23/2024)  Housing: Low Risk (09/23/2024)  Transportation Needs: No Transportation Needs (09/23/2024)  Utilities: Not At Risk (09/23/2024)  Alcohol Screen: Low Risk (08/03/2023)  Depression (PHQ2-9): Medium Risk (09/23/2024)  Financial Resource Strain: Low Risk (07/29/2024)  Physical Activity: Insufficiently Active (09/23/2024)  Social Connections: Socially Integrated (09/23/2024)  Stress: Stress Concern Present (09/23/2024)   Tobacco Use: Low Risk (09/23/2024)  Health Literacy: Adequate Health Literacy (08/03/2023)   See flowsheets for full screening details  Depression Screen PHQ 2 & 9 Depression Scale- Over the past 2 weeks, how often have you been bothered by any of the following problems? Little interest or pleasure in doing things: 0 Feeling down, depressed, or hopeless (PHQ Adolescent also includes...irritable): 0 PHQ-2 Total Score: 0 Trouble falling or staying asleep, or sleeping too much: 3 (gets 5 hrs sleep each night) Feeling tired or having little energy: 3 Poor appetite or overeating (PHQ Adolescent also includes...weight loss): 0 Feeling bad about yourself - or that you are a failure or have let yourself or your family down: 0 Trouble concentrating on things, such as reading the newspaper or watching television (PHQ Adolescent also includes...like school work): 0 Moving or speaking so slowly that other people could have noticed. Or the opposite - being so fidgety or restless that you have been moving around a lot more than usual: 0 Thoughts that you would be better off dead, or of hurting yourself in some way: 0 PHQ-9 Total Score: 6 If you checked off any problems, how difficult have these problems made it for you to do your work, take care of things at home, or get along with other people?: Not difficult at all     Goals Addressed               This Visit's Progress     Stay Active (pt-stated)   On track            Objective:    Today's Vitals   09/23/24 0812  BP: 122/63  Pulse: 68  Resp: 18  Temp: 98.6 F (37 C)  TempSrc: Oral  SpO2: 97%  Weight: 226 lb (102.5 kg)  Height: 6' 3 (1.905 m)   Body mass index is 28.25 kg/m.  Hearing/Vision screen No results found. Immunizations and Health Maintenance Health Maintenance  Topic Date Due   COVID-19 Vaccine (9 - 2025-26 season) 06/10/2024   Medicare Annual Wellness (AWV)  09/23/2025   Colonoscopy  01/29/2031    DTaP/Tdap/Td (3 - Td or Tdap) 06/18/2033   Pneumococcal Vaccine: 50+ Years  Completed   Influenza Vaccine  Completed   Hepatitis C Screening  Completed   Zoster Vaccines- Shingrix   Completed   Meningococcal B Vaccine  Aged Out        Assessment/Plan:  This is a routine wellness examination for [facility].  Patient Care Team: Domenica Harlene LABOR, MD as PCP - General (Family Medicine) Fernande Elspeth BROCKS, MD (Inactive) as PCP - Electrophysiology (Cardiology) Fernande Elspeth BROCKS, MD (Inactive) as PCP - Cardiology (Cardiology) Avram Lupita BRAVO, MD as Consulting Physician (Gastroenterology) [name], Pramod  S, MD as Consulting Physician (Neurology)  I have personally reviewed and noted the following in the patients chart:   Medical and social history Use of alcohol, tobacco or illicit drugs  Current medications and supplements including opioid prescriptions. Functional ability and status Nutritional status Physical activity Advanced directives List of other physicians Hospitalizations, surgeries, and ER visits in previous 12 months Vitals Screenings to include cognitive, depression, and falls Referrals and appointments  No orders of the defined types were placed in this encounter.  In addition, I have reviewed and discussed with patient certain preventive protocols, quality metrics, and best practice recommendations. A written personalized care plan for preventive services as well as general preventive health recommendations were provided to patient.   Lolita Libra, CMA   09/23/2024   Return in 1 year (on 09/23/2025).  After Visit Summary: (In Person-Printed) AVS printed and given to the patient  Nurse Notes: nothing significant to report  "

## 2024-09-23 NOTE — Patient Instructions (Addendum)
 Mr. Tyler Sims,  Thank you for taking the time for your Medicare Wellness Visit. I appreciate your continued commitment to your health goals. Please review the care plan we discussed, and feel free to reach out if I can assist you further.  Please note that Annual Wellness Visits do not include a physical exam. Some assessments may be limited, especially if the visit was conducted virtually. If needed, we may recommend an in-person follow-up with your provider.  Ongoing Care Seeing your primary care provider every 3 to 6 months helps us  monitor your health and provide consistent, personalized care.   Dr Domenica:  08/11/25 8:20am Medicare AWV:  09/24/24 8:20am   Recommended Screenings:  Health Maintenance  Topic Date Due   COVID-19 Vaccine (9 - 2025-26 season) 06/10/2024   Medicare Annual Wellness Visit  08/02/2024   Colon Cancer Screening  01/29/2031   DTaP/Tdap/Td vaccine (3 - Td or Tdap) 06/18/2033   Pneumococcal Vaccine for age over 60  Completed   Flu Shot  Completed   Hepatitis C Screening  Completed   Zoster (Shingles) Vaccine  Completed   Meningitis B Vaccine  Aged Out       09/17/2024    9:49 AM  Advanced Directives  Does Patient Have a Medical Advance Directive? No  Would patient like information on creating a medical advance directive? No - Patient declined   Bring a copy of your health care power of attorney and living will to the office to be added to your chart at your convenience. You can mail a copy to [facility] [address] [place], [place] [address] or email to [email or web address]  Vision: Annual vision screenings are recommended for early detection of glaucoma, cataracts, and diabetic retinopathy. These exams can also reveal signs of chronic conditions such as diabetes and high blood pressure.  Dental: Annual dental screenings help detect early signs of oral cancer, gum disease, and other conditions linked to overall health, including  heart disease and diabetes.  Please see the attached documents for additional preventive care recommendations.

## 2024-10-11 MED ORDER — LOSARTAN POTASSIUM-HCTZ 50-12.5 MG PO TABS: 1.0000 | ORAL_TABLET | Freq: Every day | ORAL | 0 refills | Status: AC

## 2024-10-17 ENCOUNTER — Ambulatory Visit: Admitting: Cardiology

## 2024-10-19 ENCOUNTER — Other Ambulatory Visit: Payer: Self-pay | Admitting: Family Medicine

## 2024-10-29 ENCOUNTER — Ambulatory Visit

## 2024-11-08 ENCOUNTER — Ambulatory Visit: Admitting: Student

## 2025-01-23 ENCOUNTER — Encounter: Admitting: Student

## 2025-08-11 ENCOUNTER — Encounter: Admitting: Family Medicine

## 2025-08-12 ENCOUNTER — Encounter: Admitting: Student

## 2025-09-24 ENCOUNTER — Ambulatory Visit
